# Patient Record
Sex: Male | Born: 1935
Health system: Southern US, Community
[De-identification: ages and names within clinical notes are randomized; demographics above are authoritative.]

## PROBLEM LIST (undated history)

## (undated) DIAGNOSIS — K219 Gastro-esophageal reflux disease without esophagitis: Secondary | ICD-10-CM

## (undated) DIAGNOSIS — T884XXA Failed or difficult intubation, initial encounter: Secondary | ICD-10-CM

## (undated) DIAGNOSIS — E119 Type 2 diabetes mellitus without complications: Secondary | ICD-10-CM

## (undated) DIAGNOSIS — I10 Essential (primary) hypertension: Secondary | ICD-10-CM

## (undated) DIAGNOSIS — E7800 Pure hypercholesterolemia, unspecified: Secondary | ICD-10-CM

## (undated) DIAGNOSIS — I4891 Unspecified atrial fibrillation: Secondary | ICD-10-CM

## (undated) DIAGNOSIS — E559 Vitamin D deficiency, unspecified: Secondary | ICD-10-CM

## (undated) DIAGNOSIS — C679 Malignant neoplasm of bladder, unspecified: Secondary | ICD-10-CM

## (undated) DIAGNOSIS — G912 (Idiopathic) normal pressure hydrocephalus: Secondary | ICD-10-CM

## (undated) DIAGNOSIS — R197 Diarrhea, unspecified: Secondary | ICD-10-CM

## (undated) DIAGNOSIS — N529 Male erectile dysfunction, unspecified: Secondary | ICD-10-CM

## (undated) DIAGNOSIS — N400 Enlarged prostate without lower urinary tract symptoms: Secondary | ICD-10-CM

## (undated) DIAGNOSIS — L219 Seborrheic dermatitis, unspecified: Secondary | ICD-10-CM

## (undated) DIAGNOSIS — F419 Anxiety disorder, unspecified: Secondary | ICD-10-CM

## (undated) HISTORY — PX: INGUINAL HERNIA REPAIR: SUR1180

## (undated) HISTORY — DX: Benign prostatic hyperplasia without lower urinary tract symptoms: N40.0

## (undated) HISTORY — DX: Vitamin D deficiency, unspecified: E55.9

## (undated) HISTORY — PX: BRAIN SURGERY: SHX531

## (undated) HISTORY — DX: Male erectile dysfunction, unspecified: N52.9

## (undated) HISTORY — DX: Malignant neoplasm of bladder, unspecified: C67.9

## (undated) HISTORY — DX: Unspecified atrial fibrillation: I48.91

## (undated) HISTORY — DX: Failed or difficult intubation, initial encounter: T88.4XXA

## (undated) HISTORY — DX: Type 2 diabetes mellitus without complications: E11.9

## (undated) HISTORY — DX: Gastro-esophageal reflux disease without esophagitis: K21.9

## (undated) HISTORY — PX: CATARACT EXTRACTION: SUR2

## (undated) HISTORY — DX: Anxiety disorder, unspecified: F41.9

## (undated) HISTORY — DX: Pure hypercholesterolemia, unspecified: E78.00

## (undated) HISTORY — DX: Diarrhea, unspecified: R19.7

## (undated) HISTORY — DX: Essential (primary) hypertension: I10

## (undated) HISTORY — DX: Seborrheic dermatitis, unspecified: L21.9

## (undated) HISTORY — DX: (Idiopathic) normal pressure hydrocephalus: G91.2

## (undated) HISTORY — PX: BLADDER SURGERY: SHX569

---

## 2000-07-22 HISTORY — PX: VENTRICULOPERITONEAL SHUNT: SHX204

## 2001-03-27 ENCOUNTER — Encounter: Admission: RE | Admit: 2001-03-27 | Discharge: 2001-03-27 | Payer: Self-pay | Admitting: Family Medicine

## 2001-03-27 ENCOUNTER — Encounter: Payer: Self-pay | Admitting: Family Medicine

## 2001-03-29 ENCOUNTER — Encounter: Admission: RE | Admit: 2001-03-29 | Discharge: 2001-03-29 | Payer: Self-pay | Admitting: Internal Medicine

## 2001-03-29 ENCOUNTER — Encounter: Payer: Self-pay | Admitting: Internal Medicine

## 2001-04-14 ENCOUNTER — Encounter: Payer: Self-pay | Admitting: Neurosurgery

## 2001-04-14 ENCOUNTER — Encounter: Admission: RE | Admit: 2001-04-14 | Discharge: 2001-04-14 | Payer: Self-pay | Admitting: Neurosurgery

## 2001-06-12 ENCOUNTER — Ambulatory Visit (HOSPITAL_COMMUNITY): Admission: RE | Admit: 2001-06-12 | Discharge: 2001-06-12 | Payer: Self-pay | Admitting: Neurology

## 2001-10-09 ENCOUNTER — Encounter: Payer: Self-pay | Admitting: General Surgery

## 2001-10-13 ENCOUNTER — Observation Stay (HOSPITAL_COMMUNITY): Admission: RE | Admit: 2001-10-13 | Discharge: 2001-10-14 | Payer: Self-pay | Admitting: General Surgery

## 2002-01-26 ENCOUNTER — Encounter: Admission: RE | Admit: 2002-01-26 | Discharge: 2002-01-26 | Payer: Self-pay | Admitting: Neurosurgery

## 2002-01-26 ENCOUNTER — Encounter: Payer: Self-pay | Admitting: Neurosurgery

## 2004-12-12 ENCOUNTER — Inpatient Hospital Stay (HOSPITAL_COMMUNITY): Admission: AD | Admit: 2004-12-12 | Discharge: 2004-12-13 | Payer: Self-pay | Admitting: Internal Medicine

## 2007-04-10 ENCOUNTER — Encounter: Payer: Self-pay | Admitting: Gastroenterology

## 2008-02-23 ENCOUNTER — Encounter: Payer: Self-pay | Admitting: Gastroenterology

## 2008-03-07 ENCOUNTER — Encounter: Payer: Self-pay | Admitting: Gastroenterology

## 2008-03-09 ENCOUNTER — Encounter: Payer: Self-pay | Admitting: Gastroenterology

## 2008-03-09 ENCOUNTER — Encounter: Admission: RE | Admit: 2008-03-09 | Discharge: 2008-03-09 | Payer: Self-pay | Admitting: Internal Medicine

## 2008-03-24 ENCOUNTER — Encounter: Payer: Self-pay | Admitting: Gastroenterology

## 2008-03-25 ENCOUNTER — Encounter: Payer: Self-pay | Admitting: Gastroenterology

## 2008-03-31 ENCOUNTER — Encounter: Payer: Self-pay | Admitting: Gastroenterology

## 2008-08-18 ENCOUNTER — Telehealth: Payer: Self-pay | Admitting: Gastroenterology

## 2010-04-03 ENCOUNTER — Ambulatory Visit: Payer: Self-pay | Admitting: Urology

## 2010-04-11 ENCOUNTER — Ambulatory Visit: Payer: Self-pay | Admitting: Urology

## 2010-04-12 LAB — PATHOLOGY REPORT

## 2010-12-07 NOTE — Procedures (Signed)
. [facility]  Patient:    Craig Whitehead, Craig Whitehead Visit Number: [id number] MRN: [id number]          Service Type: OUT Location: [facility] Attending Physician:  Erich Montane Dictated by:   Genene Churn. Love, M.D. Admit Date:  06/12/2001                             Procedure Report  DATE OF BIRTH:  1927-08-08  CLINICAL INFORMATION:  This patient is being evaluated for the possibility of communicating hydrocephalus.  A stand-walk-sit-stand-walk test x 5 with 14 feet apart chairs was performed prior to the procedure with a time of 2 minutes and 6 seconds.  TECHNICAL DESCRIPTION:  The patient was prepped and draped in left lateral decubitus position.  The L4-L5 interspace was entered without difficulty.  The opening pressure was 170 mmH2O with 34 cc of clear, colorless CSF removed and sent for studies, including VDRL, cell count, differential, protein, glucose, ______ and beta-amyloid 42 protein.  The patient tolerated the procedure well and a post-LP stand-walk-sitting test will be performed. Dictated by:   Genene Churn. Love, M.D. Attending Physician:  Erich Montane DD:  06/12/01 TD:  06/13/01 Job: [id number]

## 2010-12-07 NOTE — H&P (Signed)
NAME:  Craig Whitehead, Craig Whitehead NO.:  [id number]   MEDICAL RECORD NO.:  [id number]          PATIENT TYPE:  INP   LOCATION:  6735                         FACILITY:  [facility]   PHYSICIAN:  Candyce Churn, M.D.DATE OF BIRTH:  1927-08-12   DATE OF ADMISSION:  12/12/2004  DATE OF DISCHARGE:                                HISTORY & PHYSICAL   CHIEF COMPLAINT:  Weakness.   HISTORY OF PRESENT ILLNESS:  Craig Whitehead is a 77 year old white male  patient of Dr. Johnella Moloney with a one-day history of fatigue and  presyncope.  Patient has had decreasing p.o. intake over the last few days.  He has had some nausea during this time, but denies any abdominal pain.  No  shortness of breath or fever.  He has a history of normal pressure  hydrocephalus with shunt placement in 2001.   He has been recently placed on citalopram and blood pressure medication  which he has taken regularly.   In the office he was found to have a low blood pressure of 80/60.  He is  being admitted for the same.   REVIEW OF SYSTEMS:  No chills.  No fever.  CHEST:  No cough.  Denies any  chest pain.  No shortness of breath.  GI:  Again, no pain.  He had diarrhea  last week, but none in the last few days.  GU:  No infection symptomatology.  NEUROLOGIC:  Denies any headache, numbness, or tingling.  Generalized  weakness, but no focal weakness.   PAST MEDICAL HISTORY:  1.  Claustrophobia with chronic anxiety disorder.  2.  Hypertension.  3.  BPH.  4.  Allergic rhinitis.  5.  Normal pressure hydrocephalus with shunt placement.   MEDICATIONS:  1.  Citalopram 10 mg one a day.  2.  Avodart 0.5 mg one a day.  3.  Flomax 0.4 mg at h.s. daily.  4.  Diovan/HCT 80/12.5 one a day.   ALLERGIES:  None.   SOCIAL HISTORY:  Smoked half a pack per day for 50 years and stopped in  1997.  Drinks one or two alcoholic drinks a day.  Was previously a  [place].  Is divorced.   FAMILY HISTORY:  Father died of lung  cancer at 62.  Mother had rheumatoid  arthritis and died from complications of gold therapy at age 46.   OBJECTIVE DATA:  VITAL SIGNS:  Pulse 58, respirations 20, blood pressure  80/60, O2 saturations 93% on room air.  GENERAL:  He is a very pleasant gentleman.  Affect is somewhat flat.  He  answers appropriately.  SKIN:  Warm and dry.  GENERAL:  No acute distress.  HEENT:  Eyes:  PERRLA.  No nystagmus.  Ear, nose, and throat unremarkable.  NECK:  No JVD.  No carotid bruits auscultated.  HEART:  Normal S1, S2 without murmur, S3, click, or rub.  LUNGS:  Clear to auscultation.  ABDOMEN:  Soft and protuberant.  Bowel sounds present all four quadrants.  No abdominal tenderness is noted.  EXTREMITIES:  No edema.  NEUROLOGIC:  Cranial nerves II-XII intact.  Gait  steady.  Speech  appropriate.  No focal deficits seen.   EKG shows sinus bradycardia, rate in the 50s, unchanged from prior.   IMPRESSION:  Hypertension question secondary to blood pressure medications,  citalopram, dehydration, other.   PLAN:  Admit to telemetry.  Start normal saline IV.  Check laboratories  including TSH and an a.m. cortisol level.  Dr. Kevan Ny to follow.       TCL/MEDQ  D:  12/12/2004  T:  12/12/2004  Job:  [id number]

## 2010-12-07 NOTE — Op Note (Signed)
[facility]  Patient:    Craig Whitehead, Craig Whitehead Visit Number: [id number] MRN: [id number]          Service Type: SUR Location: 3W 0376 01 Attending Physician:  Tempie Donning Dictated by:   Gita Kudo, M.D. Proc. Date: 10/13/01 Admit Date:  10/13/2001   CC:         Genene Churn. Love, M.D.  Pearla Dubonnet, M.D.   Operative Report  OPERATIVE PROCEDURE:  Left inguinal hernia repair, with Prolene mesh preperitoneal plug and onlay.  SURGEON:  Gita Kudo, M.D.  ANESTHESIA:  General.  PREOPERATIVE DIAGNOSIS:  Left inguinal hernia.  POSTOPERATIVE DIAGNOSIS:  Left inguinal hernia--large sliding indirect and also direct.  CLINICAL SUMMARY:  This retired 77 year old male has had an inguinal hernia for many years. It is becoming larger and more symptomatic and he comes in now for elective repair.  OPERATIVE FINDINGS:  The patient had a very large left inguinal hernia that extended down to the scrotum. It had a sliding component consisting of sigmoid colon.  DESCRIPTION OF PROCEDURE:  Under satisfactory general anesthesia, having received IV Ancef, the patients abdomen was prepped and draped in the standard fashion. Transverse left lower abdominal incision made and carried down to and through external ring and external oblique. Bleeders tied with 3-0 Vicryl or coagulated. The self-retaining retractors were inserted and set to give excellent exposure. The hernia and spermatic cord were identified and dissected. The cord structures were held away with a Penrose drain and the large hernia sac opened and contents reduced under direct vision by carefully incising the colon attachments to the hernia. The sac was then closed after twisting it with a 0 Prolene suture ligature and excess cutaway. Following this, the sac was withdrawn into the abdominal cavity through the internal ring. One third of the 3 x 6-inch piece of Prolene mesh was  tailored into a badminton shuttle cock shaped plug and inserted into the defect and controlled with 3-0 Prolene sutures anchoring it the fascial edges. Then the floor of the canal was imbricated with a running 0 Prolene suture approximating the thinned out conjoined tendon and transversalis fascia over to the iliopubic tract and completely covering over the previous mesh plug and when tied, the internal ring was snug and the end of the suture left long. The remainder of the mesh was then cut into an oval with a slit to go around the cord structures. It was anchored at the internal ring with the previous sutures and then tacked around the periphery, under slight tension, to the internal oblique above and the inguinal ligament below. The tails were sutured to each other and the fascia above and lateral to the cord. The wound was then lavaged with saline and infiltrated with Marcaine for postoperative analgesia. Hemostasis was good and then the wound was closed with running 2-0 Vicryl for external oblique, 2-0 Vicryl for deep fascia, 3-0 Vicryl for subcutaneous, and Steri-Strips for skin. Sterile absorbent dressing was applied and the patient went to the recovery room from the operating room in good condition. Dictated by:   Gita Kudo, M.D. Attending Physician:  Tempie Donning DD:  10/13/01 TD:  10/14/01 Job: [id number]

## 2010-12-07 NOTE — Discharge Summary (Signed)
NAME:  Craig Whitehead, Craig Whitehead NO.:  [id number]   MEDICAL RECORD NO.:  [id number]          PATIENT TYPE:  INP   LOCATION:  6735                         FACILITY:  [facility]   PHYSICIAN:  Candyce Churn, M.D.DATE OF BIRTH:  [birth year]   DATE OF ADMISSION:  12/12/2004  DATE OF DISCHARGE:  12/13/2004                                 DISCHARGE SUMMARY   DISCHARGE DIAGNOSIS:  Orthostatic hypotension, multifactorial, likely  secondary to a combination of citalopram and blood pressure medication.   DISCHARGE MEDICATIONS:  Avodart 0.5 mg daily.   HOSPITAL COURSE:  [name] is a very pleasant 77 year old white  male who presented to [facility] on Dec 12, 2004 with a 1  day history of fatigue and presyncope. He had been having decreased p.o.  intake several days prior to admission. He has had some nausea as well. He  has a history of normal pressure hydrocephalus with shunt placement in 2001.  He has not been having any increased problems with urinary incontinence and  just feels very weak when he stands up and was amazed that he could actually  get into his car and drive to the office on the day of admission.   At [facility], he was noted to have a blood pressure of  80/60 standing and remained orthostatic on admission to [facility]. He was  treated with generous hydration with normal saline intravenously and within  24 hours was no longer orthostatic. He felt well at the time of discharge  and was able to be discharged home ambulatory and not orthostatic.   DISCHARGE PHYSICAL EXAMINATION:  VITAL SIGNS:  Revealed a blood pressure of  102 lying systolically, 102 to 110 sitting, and 102 standing. His systolic  blood pressure had dropped into the high 70s on admission.  GENERAL:  The patient experienced no diarrhea, fever, or chills while  hospitalized.   DISCHARGE LABORATORY DATA:  Revealed a white count of 9200, hemoglobin  14.8,  platelet count 280,000. Sodium 132, potassium 3.9, chloride 102, bicarb 23,  glucose was mildly elevated at 180, BUN 27, creatinine 1.5. LFTs were  normal. Hemoglobin A1c was 6.4%. Thyroid stimulating hormone was 1.177 -  normal, and a.m. cortisol was 12.5 - normal on Dec 13, 2004.   DISPOSITION:  The patient was discharged home eating well and drinking well  simply on Avodart. Citalopram 10 mg, Flomax 0.4 mg at night, and Diovan HCT  80/12.5 were all held.   FOLLOWUP:  The patient was to be followed up in my office in 1 week.       RNG/MEDQ  D:  12/15/2004  T:  12/15/2004  Job:  [id number]

## 2011-01-24 ENCOUNTER — Ambulatory Visit: Payer: Self-pay | Admitting: Urology

## 2011-02-06 ENCOUNTER — Ambulatory Visit: Payer: Self-pay | Admitting: Urology

## 2011-02-07 LAB — PATHOLOGY REPORT

## 2011-09-17 DIAGNOSIS — Z8551 Personal history of malignant neoplasm of bladder: Secondary | ICD-10-CM | POA: Diagnosis not present

## 2011-10-29 DIAGNOSIS — E119 Type 2 diabetes mellitus without complications: Secondary | ICD-10-CM | POA: Diagnosis not present

## 2011-12-19 DIAGNOSIS — R350 Frequency of micturition: Secondary | ICD-10-CM | POA: Diagnosis not present

## 2011-12-19 DIAGNOSIS — N400 Enlarged prostate without lower urinary tract symptoms: Secondary | ICD-10-CM | POA: Diagnosis not present

## 2011-12-19 DIAGNOSIS — E119 Type 2 diabetes mellitus without complications: Secondary | ICD-10-CM | POA: Diagnosis not present

## 2012-01-28 DIAGNOSIS — Z8551 Personal history of malignant neoplasm of bladder: Secondary | ICD-10-CM | POA: Diagnosis not present

## 2012-03-18 DIAGNOSIS — H26499 Other secondary cataract, unspecified eye: Secondary | ICD-10-CM | POA: Diagnosis not present

## 2012-03-18 DIAGNOSIS — H01009 Unspecified blepharitis unspecified eye, unspecified eyelid: Secondary | ICD-10-CM | POA: Diagnosis not present

## 2012-04-21 DIAGNOSIS — Z79899 Other long term (current) drug therapy: Secondary | ICD-10-CM | POA: Diagnosis not present

## 2012-04-21 DIAGNOSIS — N400 Enlarged prostate without lower urinary tract symptoms: Secondary | ICD-10-CM | POA: Diagnosis not present

## 2012-04-21 DIAGNOSIS — E119 Type 2 diabetes mellitus without complications: Secondary | ICD-10-CM | POA: Diagnosis not present

## 2012-04-21 DIAGNOSIS — L820 Inflamed seborrheic keratosis: Secondary | ICD-10-CM | POA: Diagnosis not present

## 2012-04-21 DIAGNOSIS — E782 Mixed hyperlipidemia: Secondary | ICD-10-CM | POA: Diagnosis not present

## 2012-04-21 DIAGNOSIS — R350 Frequency of micturition: Secondary | ICD-10-CM | POA: Diagnosis not present

## 2012-04-21 DIAGNOSIS — Z1331 Encounter for screening for depression: Secondary | ICD-10-CM | POA: Diagnosis not present

## 2012-04-21 DIAGNOSIS — Z0000 Encounter for general adult medical examination without abnormal findings: Secondary | ICD-10-CM | POA: Diagnosis not present

## 2012-05-26 DIAGNOSIS — N400 Enlarged prostate without lower urinary tract symptoms: Secondary | ICD-10-CM | POA: Diagnosis not present

## 2012-05-26 DIAGNOSIS — Z0000 Encounter for general adult medical examination without abnormal findings: Secondary | ICD-10-CM | POA: Diagnosis not present

## 2012-05-26 DIAGNOSIS — R350 Frequency of micturition: Secondary | ICD-10-CM | POA: Diagnosis not present

## 2012-05-26 DIAGNOSIS — E119 Type 2 diabetes mellitus without complications: Secondary | ICD-10-CM | POA: Diagnosis not present

## 2012-05-26 DIAGNOSIS — E782 Mixed hyperlipidemia: Secondary | ICD-10-CM | POA: Diagnosis not present

## 2012-08-21 DIAGNOSIS — Z8551 Personal history of malignant neoplasm of bladder: Secondary | ICD-10-CM | POA: Diagnosis not present

## 2012-08-21 DIAGNOSIS — N401 Enlarged prostate with lower urinary tract symptoms: Secondary | ICD-10-CM | POA: Diagnosis not present

## 2012-08-27 DIAGNOSIS — E119 Type 2 diabetes mellitus without complications: Secondary | ICD-10-CM | POA: Diagnosis not present

## 2012-08-27 DIAGNOSIS — E782 Mixed hyperlipidemia: Secondary | ICD-10-CM | POA: Diagnosis not present

## 2012-08-27 DIAGNOSIS — B351 Tinea unguium: Secondary | ICD-10-CM | POA: Diagnosis not present

## 2012-08-27 DIAGNOSIS — N400 Enlarged prostate without lower urinary tract symptoms: Secondary | ICD-10-CM | POA: Diagnosis not present

## 2012-08-27 DIAGNOSIS — R350 Frequency of micturition: Secondary | ICD-10-CM | POA: Diagnosis not present

## 2012-08-27 DIAGNOSIS — Z0000 Encounter for general adult medical examination without abnormal findings: Secondary | ICD-10-CM | POA: Diagnosis not present

## 2012-12-24 DIAGNOSIS — B351 Tinea unguium: Secondary | ICD-10-CM | POA: Diagnosis not present

## 2012-12-24 DIAGNOSIS — E119 Type 2 diabetes mellitus without complications: Secondary | ICD-10-CM | POA: Diagnosis not present

## 2012-12-24 DIAGNOSIS — E782 Mixed hyperlipidemia: Secondary | ICD-10-CM | POA: Diagnosis not present

## 2012-12-24 DIAGNOSIS — R350 Frequency of micturition: Secondary | ICD-10-CM | POA: Diagnosis not present

## 2012-12-24 DIAGNOSIS — N400 Enlarged prostate without lower urinary tract symptoms: Secondary | ICD-10-CM | POA: Diagnosis not present

## 2012-12-24 DIAGNOSIS — Z79899 Other long term (current) drug therapy: Secondary | ICD-10-CM | POA: Diagnosis not present

## 2013-03-09 DIAGNOSIS — E782 Mixed hyperlipidemia: Secondary | ICD-10-CM | POA: Diagnosis not present

## 2013-03-09 DIAGNOSIS — L820 Inflamed seborrheic keratosis: Secondary | ICD-10-CM | POA: Diagnosis not present

## 2013-03-09 DIAGNOSIS — R350 Frequency of micturition: Secondary | ICD-10-CM | POA: Diagnosis not present

## 2013-03-09 DIAGNOSIS — N400 Enlarged prostate without lower urinary tract symptoms: Secondary | ICD-10-CM | POA: Diagnosis not present

## 2013-03-09 DIAGNOSIS — E119 Type 2 diabetes mellitus without complications: Secondary | ICD-10-CM | POA: Diagnosis not present

## 2013-03-09 DIAGNOSIS — B351 Tinea unguium: Secondary | ICD-10-CM | POA: Diagnosis not present

## 2013-04-01 DIAGNOSIS — Z8551 Personal history of malignant neoplasm of bladder: Secondary | ICD-10-CM | POA: Diagnosis not present

## 2013-04-01 DIAGNOSIS — N401 Enlarged prostate with lower urinary tract symptoms: Secondary | ICD-10-CM | POA: Diagnosis not present

## 2013-04-01 DIAGNOSIS — N210 Calculus in bladder: Secondary | ICD-10-CM | POA: Diagnosis not present

## 2013-04-07 ENCOUNTER — Ambulatory Visit: Payer: Self-pay | Admitting: Urology

## 2013-04-07 DIAGNOSIS — N400 Enlarged prostate without lower urinary tract symptoms: Secondary | ICD-10-CM | POA: Diagnosis not present

## 2013-04-07 DIAGNOSIS — E119 Type 2 diabetes mellitus without complications: Secondary | ICD-10-CM | POA: Diagnosis not present

## 2013-04-07 DIAGNOSIS — Z01810 Encounter for preprocedural cardiovascular examination: Secondary | ICD-10-CM | POA: Diagnosis not present

## 2013-04-07 DIAGNOSIS — I10 Essential (primary) hypertension: Secondary | ICD-10-CM | POA: Diagnosis not present

## 2013-04-07 DIAGNOSIS — Z01812 Encounter for preprocedural laboratory examination: Secondary | ICD-10-CM | POA: Diagnosis not present

## 2013-04-07 LAB — BASIC METABOLIC PANEL
BUN: 17 mg/dL (ref 7–18)
Calcium, Total: 8.8 mg/dL (ref 8.5–10.1)
Chloride: 112 mmol/L — ABNORMAL HIGH (ref 98–107)
Co2: 23 mmol/L (ref 21–32)
Creatinine: 0.96 mg/dL (ref 0.60–1.30)
Glucose: 146 mg/dL — ABNORMAL HIGH (ref 65–99)
Osmolality: 287 (ref 275–301)
Sodium: 142 mmol/L (ref 136–145)

## 2013-04-07 LAB — HEMOGLOBIN: HGB: 13.1 g/dL (ref 13.0–18.0)

## 2013-04-14 ENCOUNTER — Ambulatory Visit: Payer: Self-pay | Admitting: Urology

## 2013-04-14 DIAGNOSIS — Z888 Allergy status to other drugs, medicaments and biological substances status: Secondary | ICD-10-CM | POA: Diagnosis not present

## 2013-04-14 DIAGNOSIS — Z8551 Personal history of malignant neoplasm of bladder: Secondary | ICD-10-CM | POA: Diagnosis not present

## 2013-04-14 DIAGNOSIS — K219 Gastro-esophageal reflux disease without esophagitis: Secondary | ICD-10-CM | POA: Diagnosis not present

## 2013-04-14 DIAGNOSIS — E119 Type 2 diabetes mellitus without complications: Secondary | ICD-10-CM | POA: Diagnosis not present

## 2013-04-14 DIAGNOSIS — N210 Calculus in bladder: Secondary | ICD-10-CM | POA: Diagnosis not present

## 2013-04-14 DIAGNOSIS — N401 Enlarged prostate with lower urinary tract symptoms: Secondary | ICD-10-CM | POA: Diagnosis not present

## 2013-04-14 DIAGNOSIS — Z91018 Allergy to other foods: Secondary | ICD-10-CM | POA: Diagnosis not present

## 2013-04-14 DIAGNOSIS — Z91041 Radiographic dye allergy status: Secondary | ICD-10-CM | POA: Diagnosis not present

## 2013-04-14 DIAGNOSIS — Z87891 Personal history of nicotine dependence: Secondary | ICD-10-CM | POA: Diagnosis not present

## 2013-04-14 DIAGNOSIS — Z79899 Other long term (current) drug therapy: Secondary | ICD-10-CM | POA: Diagnosis not present

## 2013-04-14 DIAGNOSIS — I440 Atrioventricular block, first degree: Secondary | ICD-10-CM | POA: Diagnosis not present

## 2013-04-14 DIAGNOSIS — N400 Enlarged prostate without lower urinary tract symptoms: Secondary | ICD-10-CM | POA: Diagnosis not present

## 2013-05-17 DIAGNOSIS — N401 Enlarged prostate with lower urinary tract symptoms: Secondary | ICD-10-CM | POA: Diagnosis not present

## 2013-06-08 DIAGNOSIS — N400 Enlarged prostate without lower urinary tract symptoms: Secondary | ICD-10-CM | POA: Diagnosis not present

## 2013-06-08 DIAGNOSIS — E782 Mixed hyperlipidemia: Secondary | ICD-10-CM | POA: Diagnosis not present

## 2013-06-08 DIAGNOSIS — L820 Inflamed seborrheic keratosis: Secondary | ICD-10-CM | POA: Diagnosis not present

## 2013-06-08 DIAGNOSIS — E1149 Type 2 diabetes mellitus with other diabetic neurological complication: Secondary | ICD-10-CM | POA: Diagnosis not present

## 2013-06-08 DIAGNOSIS — R350 Frequency of micturition: Secondary | ICD-10-CM | POA: Diagnosis not present

## 2013-10-28 DIAGNOSIS — Z8551 Personal history of malignant neoplasm of bladder: Secondary | ICD-10-CM | POA: Diagnosis not present

## 2013-12-30 DIAGNOSIS — N400 Enlarged prostate without lower urinary tract symptoms: Secondary | ICD-10-CM | POA: Diagnosis not present

## 2013-12-30 DIAGNOSIS — I10 Essential (primary) hypertension: Secondary | ICD-10-CM | POA: Diagnosis not present

## 2013-12-30 DIAGNOSIS — E119 Type 2 diabetes mellitus without complications: Secondary | ICD-10-CM | POA: Diagnosis not present

## 2013-12-30 DIAGNOSIS — R350 Frequency of micturition: Secondary | ICD-10-CM | POA: Diagnosis not present

## 2013-12-30 DIAGNOSIS — E559 Vitamin D deficiency, unspecified: Secondary | ICD-10-CM | POA: Diagnosis not present

## 2013-12-30 DIAGNOSIS — R3915 Urgency of urination: Secondary | ICD-10-CM | POA: Diagnosis not present

## 2013-12-30 DIAGNOSIS — B351 Tinea unguium: Secondary | ICD-10-CM | POA: Diagnosis not present

## 2014-01-06 DIAGNOSIS — R351 Nocturia: Secondary | ICD-10-CM | POA: Diagnosis not present

## 2014-01-06 DIAGNOSIS — R350 Frequency of micturition: Secondary | ICD-10-CM | POA: Diagnosis not present

## 2014-01-06 DIAGNOSIS — C679 Malignant neoplasm of bladder, unspecified: Secondary | ICD-10-CM | POA: Diagnosis not present

## 2014-01-18 DIAGNOSIS — C679 Malignant neoplasm of bladder, unspecified: Secondary | ICD-10-CM | POA: Diagnosis not present

## 2014-01-18 DIAGNOSIS — N400 Enlarged prostate without lower urinary tract symptoms: Secondary | ICD-10-CM | POA: Diagnosis not present

## 2014-04-19 DIAGNOSIS — E782 Mixed hyperlipidemia: Secondary | ICD-10-CM | POA: Diagnosis not present

## 2014-04-19 DIAGNOSIS — IMO0001 Reserved for inherently not codable concepts without codable children: Secondary | ICD-10-CM | POA: Diagnosis not present

## 2014-04-19 DIAGNOSIS — R32 Unspecified urinary incontinence: Secondary | ICD-10-CM | POA: Diagnosis not present

## 2014-04-19 DIAGNOSIS — I10 Essential (primary) hypertension: Secondary | ICD-10-CM | POA: Diagnosis not present

## 2014-04-19 DIAGNOSIS — Z0000 Encounter for general adult medical examination without abnormal findings: Secondary | ICD-10-CM | POA: Diagnosis not present

## 2014-04-19 DIAGNOSIS — E559 Vitamin D deficiency, unspecified: Secondary | ICD-10-CM | POA: Diagnosis not present

## 2014-04-19 DIAGNOSIS — G911 Obstructive hydrocephalus: Secondary | ICD-10-CM | POA: Diagnosis not present

## 2014-04-19 DIAGNOSIS — Z125 Encounter for screening for malignant neoplasm of prostate: Secondary | ICD-10-CM | POA: Diagnosis not present

## 2014-04-19 DIAGNOSIS — R279 Unspecified lack of coordination: Secondary | ICD-10-CM | POA: Diagnosis not present

## 2014-04-19 DIAGNOSIS — Z1331 Encounter for screening for depression: Secondary | ICD-10-CM | POA: Diagnosis not present

## 2014-04-28 DIAGNOSIS — Z961 Presence of intraocular lens: Secondary | ICD-10-CM | POA: Diagnosis not present

## 2014-04-28 DIAGNOSIS — E119 Type 2 diabetes mellitus without complications: Secondary | ICD-10-CM | POA: Diagnosis not present

## 2014-04-29 DIAGNOSIS — Z23 Encounter for immunization: Secondary | ICD-10-CM | POA: Diagnosis not present

## 2014-04-29 DIAGNOSIS — R197 Diarrhea, unspecified: Secondary | ICD-10-CM | POA: Diagnosis not present

## 2014-04-29 DIAGNOSIS — L820 Inflamed seborrheic keratosis: Secondary | ICD-10-CM | POA: Diagnosis not present

## 2014-04-29 DIAGNOSIS — E1165 Type 2 diabetes mellitus with hyperglycemia: Secondary | ICD-10-CM | POA: Diagnosis not present

## 2014-05-27 DIAGNOSIS — N400 Enlarged prostate without lower urinary tract symptoms: Secondary | ICD-10-CM | POA: Diagnosis not present

## 2014-05-27 DIAGNOSIS — C678 Malignant neoplasm of overlapping sites of bladder: Secondary | ICD-10-CM | POA: Diagnosis not present

## 2014-05-27 DIAGNOSIS — R3915 Urgency of urination: Secondary | ICD-10-CM | POA: Diagnosis not present

## 2014-05-27 DIAGNOSIS — N318 Other neuromuscular dysfunction of bladder: Secondary | ICD-10-CM | POA: Diagnosis not present

## 2014-06-01 DIAGNOSIS — G913 Post-traumatic hydrocephalus, unspecified: Secondary | ICD-10-CM | POA: Diagnosis not present

## 2014-06-01 DIAGNOSIS — R270 Ataxia, unspecified: Secondary | ICD-10-CM | POA: Diagnosis not present

## 2014-06-01 DIAGNOSIS — R3981 Functional urinary incontinence: Secondary | ICD-10-CM | POA: Diagnosis not present

## 2014-06-15 DIAGNOSIS — G913 Post-traumatic hydrocephalus, unspecified: Secondary | ICD-10-CM | POA: Diagnosis not present

## 2014-06-21 ENCOUNTER — Other Ambulatory Visit: Payer: Self-pay | Admitting: Neurosurgery

## 2014-06-21 DIAGNOSIS — N390 Urinary tract infection, site not specified: Secondary | ICD-10-CM | POA: Diagnosis not present

## 2014-06-21 DIAGNOSIS — R197 Diarrhea, unspecified: Secondary | ICD-10-CM | POA: Diagnosis not present

## 2014-06-21 DIAGNOSIS — R3915 Urgency of urination: Secondary | ICD-10-CM | POA: Diagnosis not present

## 2014-06-21 DIAGNOSIS — E1165 Type 2 diabetes mellitus with hyperglycemia: Secondary | ICD-10-CM | POA: Diagnosis not present

## 2014-06-21 DIAGNOSIS — R350 Frequency of micturition: Secondary | ICD-10-CM | POA: Diagnosis not present

## 2014-06-21 DIAGNOSIS — G913 Post-traumatic hydrocephalus, unspecified: Secondary | ICD-10-CM

## 2014-06-21 DIAGNOSIS — E559 Vitamin D deficiency, unspecified: Secondary | ICD-10-CM | POA: Diagnosis not present

## 2014-06-28 DIAGNOSIS — R350 Frequency of micturition: Secondary | ICD-10-CM | POA: Diagnosis not present

## 2014-06-28 DIAGNOSIS — N3281 Overactive bladder: Secondary | ICD-10-CM | POA: Diagnosis not present

## 2014-06-28 DIAGNOSIS — N400 Enlarged prostate without lower urinary tract symptoms: Secondary | ICD-10-CM | POA: Diagnosis not present

## 2014-07-01 ENCOUNTER — Ambulatory Visit
Admission: RE | Admit: 2014-07-01 | Discharge: 2014-07-01 | Disposition: A | Discharge status: Home / Self Care | Payer: Medicare Other | Source: Ambulatory Visit | Attending: Neurosurgery | Admitting: Neurosurgery

## 2014-07-01 DIAGNOSIS — Z982 Presence of cerebrospinal fluid drainage device: Secondary | ICD-10-CM | POA: Diagnosis not present

## 2014-07-01 DIAGNOSIS — C679 Malignant neoplasm of bladder, unspecified: Secondary | ICD-10-CM | POA: Diagnosis not present

## 2014-07-01 DIAGNOSIS — G913 Post-traumatic hydrocephalus, unspecified: Secondary | ICD-10-CM | POA: Diagnosis not present

## 2014-07-01 MED ORDER — GADOBENATE DIMEGLUMINE 529 MG/ML IV SOLN
15.0000 mL | Freq: Once | INTRAVENOUS | Status: AC | PRN
  Administered 2014-07-01: 15 mL via INTRAVENOUS

## 2014-07-05 DIAGNOSIS — R270 Ataxia, unspecified: Secondary | ICD-10-CM | POA: Diagnosis not present

## 2014-07-05 DIAGNOSIS — E1165 Type 2 diabetes mellitus with hyperglycemia: Secondary | ICD-10-CM | POA: Diagnosis not present

## 2014-07-05 DIAGNOSIS — R350 Frequency of micturition: Secondary | ICD-10-CM | POA: Diagnosis not present

## 2014-07-05 DIAGNOSIS — R197 Diarrhea, unspecified: Secondary | ICD-10-CM | POA: Diagnosis not present

## 2014-07-05 DIAGNOSIS — E559 Vitamin D deficiency, unspecified: Secondary | ICD-10-CM | POA: Diagnosis not present

## 2014-07-05 DIAGNOSIS — R3915 Urgency of urination: Secondary | ICD-10-CM | POA: Diagnosis not present

## 2014-07-06 ENCOUNTER — Encounter: Payer: Self-pay | Admitting: Neurology

## 2014-07-06 ENCOUNTER — Ambulatory Visit (INDEPENDENT_AMBULATORY_CARE_PROVIDER_SITE_OTHER): Payer: Medicare Other | Admitting: Neurology

## 2014-07-06 VITALS — BP 128/76 | HR 60 | Ht 67.0 in | Wt 155.0 lb

## 2014-07-06 DIAGNOSIS — G20 Parkinson's disease: Secondary | ICD-10-CM

## 2014-07-06 DIAGNOSIS — Z982 Presence of cerebrospinal fluid drainage device: Secondary | ICD-10-CM | POA: Diagnosis not present

## 2014-07-06 MED ORDER — CARBIDOPA-LEVODOPA 25-100 MG PO TABS: 1.0000 | ORAL_TABLET | Freq: Three times a day (TID) | ORAL | Status: DC

## 2014-07-06 NOTE — Progress Notes (Signed)
Craig Whitehead was seen today in the movement disorders clinic for neurologic consultation at the request of Dr. Vertell Limber.  His PCP is GATES,ROBERT NEVILL, MD.  The consultation is for the evaluation of ataxia.  The records that were made available to me were reviewed.  The patient has previously seen Dr. Erling Cruz as well as, but unfortunately none of those records are available any longer.   Pt has a hx of VP shunt. Pt states that it was placed in 2002 but Dr. Donald Pore records indicate it was 27.   Pt reports that he fell 3-4 years prior to the placement of the VP shunt.  He hit the back of his head on a table.  He really didn't think that much of it.  Not long thereafter he started shuffling but he didn't see the neurosurgeon (Dr. Carloyn Manner) until 3-4 years later.  Dr. Carloyn Manner sent pt to Dr. Erling Cruz for a 2nd opinion and a LP was done and it was felt that pt was better after the LP.  He was sent for a shunt and felt that he had a post-traumatic hydrocephalous (although pt reports today that the dx was NPH).   Pt states that post shunt, he got a "little" better (maybe 50/50 per patient) but thinks with time that he may be getting worse.  If he is carrying something he will fall.    An MRI of the brain was done with and without contrast on 07/01/2014.  I reviewed this.  There was ventriculomegaly that was out of proportion to atrophy and mild small vessel disease.  Specific Symptoms:  Tremor: No. Voice: no change Sleep: trouble getting to sleep and trouble staying asleep  Vivid Dreams:  Yes.    Acting out dreams:  No. Wet Pillows: No. Postural symptoms:  No.  Falls?  Yes.   Bradykinesia symptoms: shuffling gait, slow movements, difficulty getting out of a chair and difficulty regaining balance Loss of smell:  No. Loss of taste:  No. Urinary Incontinence:  Yes.   (multiple bladder surgeries due to bladder CA and BPH; incontinence did start prior to shunt) Difficulty Swallowing:  No. Handwriting, micrographia:  No. Trouble with ADL's:  No.  Trouble buttoning clothing: Yes.   (cuff buttons) Depression:  No. Memory changes:  No. Hallucinations:  No.  visual distortions: No. N/V:  No. Lightheaded:  No.  Syncope: No. Diplopia:  No. Dyskinesia:  No.    PREVIOUS MEDICATIONS: none to date  ALLERGIES:  No Known Allergies  CURRENT MEDICATIONS:  Outpatient Encounter Prescriptions as of 07/06/2014  Medication Sig  . cholecalciferol (VITAMIN D) 1000 UNITS tablet Take 4,000 Units by mouth daily.  . cholestyramine (QUESTRAN) 4 GM/DOSE powder Take 1 g by mouth daily.   . mirabegron ER (MYRBETRIQ) 50 MG TB24 tablet Take 50 mg by mouth daily.  . solifenacin (VESICARE) 10 MG tablet Take 10 mg by mouth daily.    PAST MEDICAL HISTORY:   Past Medical History  Diagnosis Date  . Bladder cancer   . BPH (benign prostatic hypertrophy)   . NPH (normal pressure hydrocephalus)   . Diarrhea   . Vitamin D deficiency     PAST SURGICAL HISTORY:   Past Surgical History  Procedure Laterality Date  . Ventriculoperitoneal shunt  2002  . Inguinal hernia repair Bilateral   . Bladder surgery      x 4 for bladder cancer  . Cataract extraction Bilateral     SOCIAL HISTORY:   History  Social History  . Marital Status: Divorced    Spouse Name: N/A    Number of Children: N/A  . Years of Education: N/A   Occupational History  . Not on file.   Social History Main Topics  . Smoking status: Former Smoker    Quit date: 07/06/1994  . Smokeless tobacco: Not on file  . Alcohol Use: 0.0 oz/week    0 Not specified per week     Comment: 2 shots daily  . Drug Use: No  . Sexual Activity: Not on file   Other Topics Concern  . Not on file   Social History Narrative  . No narrative on file    FAMILY HISTORY:   Family Status  Relation Status Death Age  . Mother Deceased     arthritis (died in hospital after treatment)  . Father Deceased     cancer  . Brother Alive     DM  . Brother Alive      healthy  . Son Alive     healthy  . Daughter Alive     healthy  . Daughter Alive     healthy    ROS:  A complete 10 system review of systems was obtained and was unremarkable apart from what is mentioned above.  PHYSICAL EXAMINATION:    VITALS:   Filed Vitals:   07/06/14 0936  BP: 128/76  Pulse: 60  Height: 5\' 7"  (1.702 m)  Weight: 155 lb (70.308 kg)    GEN:  The patient appears stated age and is in NAD. HEENT:  Normocephalic, atraumatic.  The mucous membranes are moist. The superficial temporal arteries are without ropiness or tenderness. CV:  RRR Lungs:  CTAB Neck/HEME:  There are no carotid bruits bilaterally.  Neurological examination:  Orientation: The patient is alert and oriented x3. Fund of knowledge is appropriate.  Recent and remote memory are intact.  Attention and concentration are normal.    Able to name objects and repeat phrases. Cranial nerves: There is good facial symmetry. Pupils are equal round and reactive to light bilaterally. Fundoscopic exam reveals clear margins bilaterally. Extraocular muscles are intact. The visual fields are full to confrontational testing. The speech is fluent and clear. Soft palate rises symmetrically and there is no tongue deviation. Hearing is intact to conversational tone. Sensation: Sensation is intact to light and pinprick throughout (facial, trunk, extremities). Vibration is decreased at the bilateral big toe. There is no extinction with double simultaneous stimulation. There is no sensory dermatomal level identified. Motor: Strength is 5/5 in the bilateral upper and lower extremities.   Shoulder shrug is equal and symmetric.  There is no pronator drift. Deep tendon reflexes: Deep tendon reflexes are 2/4 at the bilateral biceps, triceps, brachioradialis, patella and  trace at the bilateral achilles. Plantar responses are downgoing bilaterally.  Movement examination: Tone: There is normal tone in the bilateral upper extremities.   The tone in the lower extremities is normal Abnormal movements: slight tremor can be felt with distraction in the LUE.  There is chin tremor. Coordination:  There is decremation with RAM's, seen with hand opening/closing Gait and Station: The patient has mild difficulty arising out of a deep-seated chair without the use of the hands. The patient's stride length is decreased.  There is decreased arm swing.    ASSESSMENT/PLAN:  1.  Parkinsonism  -Not convinced on idiopathic PD.  IF he would have had this since 1999, I think it would have been very obvious by now.  However, he is bradykinetic, has mild tremor and has postural instability (no significant rigidity).  Still could be from NPH but has non-programmable shunt.  Only option at this point is to try levodopa and see if it helps.  He and I discussed r/b/se and he would like to try.  If no help, we could either d/c or increase dose a little.  He is willing to try.  -f/u 8-10 weeks, sooner should new neurologic issues arise  -may benefit from PD PT program but will wait to see if levodopa any benefit.

## 2014-07-06 NOTE — Patient Instructions (Signed)
1. Start Carbidopa Levodopa as follows: 1/2 tab three times a day before meals x 1 wk, then 1/2 in am & noon & 1 in evening for a week, then 1/2 in am &1 at noon &one in evening for a week, then 1 tablet three times a day before meals.    

## 2014-08-04 DIAGNOSIS — N400 Enlarged prostate without lower urinary tract symptoms: Secondary | ICD-10-CM | POA: Diagnosis not present

## 2014-08-04 DIAGNOSIS — N3281 Overactive bladder: Secondary | ICD-10-CM | POA: Diagnosis not present

## 2014-09-06 ENCOUNTER — Ambulatory Visit (INDEPENDENT_AMBULATORY_CARE_PROVIDER_SITE_OTHER): Payer: Medicare Other | Admitting: Neurology

## 2014-09-06 ENCOUNTER — Encounter: Payer: Self-pay | Admitting: Neurology

## 2014-09-06 VITALS — BP 102/58 | HR 60 | Ht 67.0 in | Wt 157.0 lb

## 2014-09-06 DIAGNOSIS — G20 Parkinson's disease: Secondary | ICD-10-CM

## 2014-09-06 DIAGNOSIS — Z982 Presence of cerebrospinal fluid drainage device: Secondary | ICD-10-CM | POA: Diagnosis not present

## 2014-09-06 NOTE — Progress Notes (Signed)
Note faxed to Dr Inda Merlin at [phone] with confirmation received.

## 2014-09-06 NOTE — Progress Notes (Signed)
Craig Whitehead was seen today in the movement disorders clinic for neurologic consultation at the request of Dr. Vertell Limber.  His PCP is GATES,ROBERT NEVILL, MD.  The consultation is for the evaluation of ataxia.  The records that were made available to me were reviewed.  The patient has previously seen Dr. Erling Cruz as well as, but unfortunately none of those records are available any longer.   Pt has a hx of VP shunt. Pt states that it was placed in 2002 but Dr. Donald Pore records indicate it was 32.   Pt reports that he fell 3-4 years prior to the placement of the VP shunt.  He hit the back of his head on a table.  He really didn't think that much of it.  Not long thereafter he started shuffling but he didn't see the neurosurgeon (Dr. Carloyn Manner) until 3-4 years later.  Dr. Carloyn Manner sent pt to Dr. Erling Cruz for a 2nd opinion and a LP was done and it was felt that pt was better after the LP.  He was sent for a shunt and felt that he had a post-traumatic hydrocephalous (although pt reports today that the dx was NPH).   Pt states that post shunt, he got a "little" better (maybe 50/50 per patient) but thinks with time that he may be getting worse.  If he is carrying something he will fall.    An MRI of the brain was done with and without contrast on 07/01/2014.  I reviewed this.  There was ventriculomegaly that was out of proportion to atrophy and mild small vessel disease.  09/06/14 update:  Pt returns for follow up.  I did try him on levodopa since last visit and that didn't seem to make any difference in his sx's.  He was told by someone that he used to shake and he doesn't anymore.  He thinks that his balance is good.  He takes the carbidopa/levodopa 25/100 at 6:30, noon and 5 pm.   He has had no falls, no hallucinations, no lightheadedness.      PREVIOUS MEDICATIONS: none to date  ALLERGIES:  No Known Allergies  CURRENT MEDICATIONS:  Outpatient Encounter Prescriptions as of 09/06/2014  Medication Sig  .  carbidopa-levodopa (SINEMET IR) 25-100 MG per tablet Take 1 tablet by mouth 3 (three) times daily.  . cholecalciferol (VITAMIN D) 1000 UNITS tablet Take 4,000 Units by mouth daily.  . cholestyramine (QUESTRAN) 4 GM/DOSE powder Take 1 g by mouth daily.   . mirabegron ER (MYRBETRIQ) 50 MG TB24 tablet Take 50 mg by mouth daily.  . solifenacin (VESICARE) 10 MG tablet Take 10 mg by mouth daily.    PAST MEDICAL HISTORY:   Past Medical History  Diagnosis Date  . Bladder cancer   . BPH (benign prostatic hypertrophy)   . NPH (normal pressure hydrocephalus)   . Diarrhea   . Vitamin D deficiency     PAST SURGICAL HISTORY:   Past Surgical History  Procedure Laterality Date  . Ventriculoperitoneal shunt  2002  . Inguinal hernia repair Bilateral   . Bladder surgery      x 4 for bladder cancer  . Cataract extraction Bilateral     SOCIAL HISTORY:   History   Social History  . Marital Status: Divorced    Spouse Name: N/A  . Number of Children: N/A  . Years of Education: N/A   Occupational History  . Not on file.   Social History Main Topics  . Smoking status: Former  Smoker    Quit date: 07/06/1994  . Smokeless tobacco: Not on file  . Alcohol Use: 0.0 oz/week    0 Standard drinks or equivalent per week     Comment: 2 shots daily  . Drug Use: No  . Sexual Activity: Not on file   Other Topics Concern  . Not on file   Social History Narrative    FAMILY HISTORY:   Family Status  Relation Status Death Age  . Mother Deceased     arthritis (died in hospital after treatment)  . Father Deceased     cancer  . Brother Alive     DM  . Brother Alive     healthy  . Son Alive     healthy  . Daughter Alive     healthy  . Daughter Alive     healthy    ROS:  A complete 10 system review of systems was obtained and was unremarkable apart from what is mentioned above.  PHYSICAL EXAMINATION:    VITALS:   Filed Vitals:   09/06/14 1007  BP: 102/58  Pulse: 60  Height: 5\' 7"   (1.702 m)  Weight: 157 lb (71.215 kg)    GEN:  The patient appears stated age and is in NAD. HEENT:  Normocephalic, atraumatic.  The mucous membranes are moist. The superficial temporal arteries are without ropiness or tenderness. CV:  RRR Lungs:  CTAB Neck/HEME:  There are no carotid bruits bilaterally.  Neurological examination:  Orientation: The patient is alert and oriented x3. Fund of knowledge is appropriate.  Recent and remote memory are intact.  Attention and concentration are normal.    Able to name objects and repeat phrases. Cranial nerves: There is good facial symmetry. Pupils are equal round and reactive to light bilaterally. Fundoscopic exam reveals clear margins bilaterally. Extraocular muscles are intact. The visual fields are full to confrontational testing. The speech is fluent and clear. Soft palate rises symmetrically and there is no tongue deviation. Hearing is intact to conversational tone. Sensation: Sensation is intact to light and pinprick throughout (facial, trunk, extremities). Vibration is decreased at the bilateral big toe. There is no extinction with double simultaneous stimulation. There is no sensory dermatomal level identified. Motor: Strength is 5/5 in the bilateral upper and lower extremities.   Shoulder shrug is equal and symmetric.  There is no pronator drift. Deep tendon reflexes: Deep tendon reflexes are 2/4 at the bilateral biceps, triceps, brachioradialis, patella and  trace at the bilateral achilles. Plantar responses are downgoing bilaterally.  Movement examination: Tone: There is normal tone in the bilateral upper extremities.  The tone in the lower extremities is normal Abnormal movements: cannot feel arm tremor today.  There is chin tremor. Coordination:  There is no significant decremation with RAM's on either side Gait and Station: The patient has mild difficulty arising out of a deep-seated chair without the use of the hands. The patient's stride  length is decreased.  There is decreased arm swing.    ASSESSMENT/PLAN:  1.  Parkinsonism  -Not convinced on idiopathic PD.  IF he would have had this since 1999, I think it would have been very obvious by now.  However, he is bradykinetic, has mild tremor and has postural instability (no significant rigidity).  Still could be from NPH but has non-programmable shunt.  Could also be vascular in nature.  Nonetheless, he didn't think levodopa that beneficial and will d/c it.  Doesn't want to enroll in PT program and will  let us know if he changes his mind.  -f/u prn

## 2014-09-15 DIAGNOSIS — N3281 Overactive bladder: Secondary | ICD-10-CM | POA: Diagnosis not present

## 2014-10-11 DIAGNOSIS — R350 Frequency of micturition: Secondary | ICD-10-CM | POA: Diagnosis not present

## 2014-10-11 DIAGNOSIS — N3281 Overactive bladder: Secondary | ICD-10-CM | POA: Diagnosis not present

## 2014-10-24 DIAGNOSIS — E1165 Type 2 diabetes mellitus with hyperglycemia: Secondary | ICD-10-CM | POA: Diagnosis not present

## 2014-10-24 DIAGNOSIS — R350 Frequency of micturition: Secondary | ICD-10-CM | POA: Diagnosis not present

## 2014-10-24 DIAGNOSIS — R3915 Urgency of urination: Secondary | ICD-10-CM | POA: Diagnosis not present

## 2014-10-24 DIAGNOSIS — E559 Vitamin D deficiency, unspecified: Secondary | ICD-10-CM | POA: Diagnosis not present

## 2014-10-24 DIAGNOSIS — R270 Ataxia, unspecified: Secondary | ICD-10-CM | POA: Diagnosis not present

## 2014-10-24 DIAGNOSIS — R159 Full incontinence of feces: Secondary | ICD-10-CM | POA: Diagnosis not present

## 2014-11-11 NOTE — Op Note (Signed)
PATIENT NAME:  Craig Whitehead, ANNE MR#:  [id number] DATE OF BIRTH:  [birth year]  DATE OF PROCEDURE:  04/14/2013  PREOPERATIVE DIAGNOSES:  1.  Benign prostatic hypertrophy with lower urinary tract symptoms.  2.  Bladder calculus.   POSTOPERATIVE DIAGNOSES: 1.  Benign prostatic hypertrophy with lower urinary tract symptoms.  2.  Bladder calculus.    PROCEDURES:  1.  Photoselective vaporization of the prostate.  2.  Cystolitholapaxy.   SURGEON: Scott C. Bernardo Heater, M.D.   ASSISTANT: None.   ANESTHESIA: General.   INDICATIONS: This is an 81 year old male with moderate to severe lower urinary tract symptoms. He has been on Avodart, tamsulosin and anticholinergic medication with minimal improvement in his symptoms. He complains of frequency, urgency, hesitancy and decreased force and caliber of his urinary stream. Recent cystoscopy shows moderate median lobe and lateral lobe enlargement. A bladder calculus was present. After discussion of treatment options, he has elected photoselective vaporization and cystolitholapaxy.   DESCRIPTION OF PROCEDURE: The patient was taken to the cystoscopy suite and placed on the table in the supine position where a general anesthetic was administered via an LMA. He was then placed in the low lithotomy position and his external genitalia were prepped and draped in the usual fashion. A timeout was performed per protocol with all in agreement. A 22-French continuous flow laser cystoscope was lubricated and passed under direct vision. The urethra was normal in caliber without stricture. Prostate is remarkable for a moderate median lobe and moderate lateral lobe enlargement. Approximately 15 mm calculus was noted in the bladder. There were no bladder mucosal lesions present. A KTP laser fiber was placed through the cystoscope and beginning at a power of 80 watts, median lobe was vaporized down to capsular fibers working from the bladder neck toward the verumontanum. Power was  increased to 100 watts and the lateral lobes were vaporized from bladder neck to verumontanum. Some additional tissue on the right lateral lobe was vaporized at 120 watts. Hemostasis was obtained with laser coagulation. At the completion of the procedure, hemostasis was adequate and the channel was opened with the scope positioned at the verumontanum.  Laser time was 34 minutes, 58 seconds. Total energy delivered was 195,674 joules. A 975 micron holmium laser fiber was then placed through the cystoscope. The bladder stone was easily fragmented at a power of 10 watts and  all fragments were removed with irrigation. All areas were inspected for hemostasis, which was adequate. The cystoscope was removed. A 20- French Foley catheter was placed with return of light pink-tinged effluent upon irrigation. A B and O suppository was placed per rectum. The patient was taken to PACU in stable condition. There were no complications. EBL was minimal.    ____________________________ [name]. Bernardo Heater, MD scs:aw D: 04/14/2013 09:50:18 ET T: 04/14/2013 10:08:58 ET JOB#: [id number]  cc: Nicki Reaper C. Bernardo Heater, MD, <Dictator> Abbie Sons MD ELECTRONICALLY SIGNED 04/22/2013 12:09

## 2014-11-14 DIAGNOSIS — N3281 Overactive bladder: Secondary | ICD-10-CM | POA: Diagnosis not present

## 2014-11-14 DIAGNOSIS — R350 Frequency of micturition: Secondary | ICD-10-CM | POA: Diagnosis not present

## 2014-11-14 DIAGNOSIS — Z0000 Encounter for general adult medical examination without abnormal findings: Secondary | ICD-10-CM | POA: Diagnosis not present

## 2014-12-13 DIAGNOSIS — C678 Malignant neoplasm of overlapping sites of bladder: Secondary | ICD-10-CM | POA: Diagnosis not present

## 2015-01-27 DIAGNOSIS — R350 Frequency of micturition: Secondary | ICD-10-CM | POA: Diagnosis not present

## 2015-01-27 DIAGNOSIS — R3915 Urgency of urination: Secondary | ICD-10-CM | POA: Diagnosis not present

## 2015-01-27 DIAGNOSIS — E1165 Type 2 diabetes mellitus with hyperglycemia: Secondary | ICD-10-CM | POA: Diagnosis not present

## 2015-01-27 DIAGNOSIS — E559 Vitamin D deficiency, unspecified: Secondary | ICD-10-CM | POA: Diagnosis not present

## 2015-01-27 DIAGNOSIS — R159 Full incontinence of feces: Secondary | ICD-10-CM | POA: Diagnosis not present

## 2015-01-27 DIAGNOSIS — R270 Ataxia, unspecified: Secondary | ICD-10-CM | POA: Diagnosis not present

## 2015-02-07 DIAGNOSIS — N3281 Overactive bladder: Secondary | ICD-10-CM | POA: Diagnosis not present

## 2015-03-14 DIAGNOSIS — R350 Frequency of micturition: Secondary | ICD-10-CM | POA: Diagnosis not present

## 2015-03-14 DIAGNOSIS — N3281 Overactive bladder: Secondary | ICD-10-CM | POA: Diagnosis not present

## 2015-04-18 DIAGNOSIS — N3281 Overactive bladder: Secondary | ICD-10-CM | POA: Diagnosis not present

## 2015-04-18 DIAGNOSIS — R350 Frequency of micturition: Secondary | ICD-10-CM | POA: Diagnosis not present

## 2015-05-05 DIAGNOSIS — R270 Ataxia, unspecified: Secondary | ICD-10-CM | POA: Diagnosis not present

## 2015-05-05 DIAGNOSIS — E559 Vitamin D deficiency, unspecified: Secondary | ICD-10-CM | POA: Diagnosis not present

## 2015-05-05 DIAGNOSIS — E1165 Type 2 diabetes mellitus with hyperglycemia: Secondary | ICD-10-CM | POA: Diagnosis not present

## 2015-05-05 DIAGNOSIS — R3915 Urgency of urination: Secondary | ICD-10-CM | POA: Diagnosis not present

## 2015-05-05 DIAGNOSIS — R350 Frequency of micturition: Secondary | ICD-10-CM | POA: Diagnosis not present

## 2015-05-05 DIAGNOSIS — R159 Full incontinence of feces: Secondary | ICD-10-CM | POA: Diagnosis not present

## 2015-05-05 DIAGNOSIS — I10 Essential (primary) hypertension: Secondary | ICD-10-CM | POA: Diagnosis not present

## 2015-07-06 DIAGNOSIS — R350 Frequency of micturition: Secondary | ICD-10-CM | POA: Diagnosis not present

## 2015-07-06 DIAGNOSIS — N3281 Overactive bladder: Secondary | ICD-10-CM | POA: Diagnosis not present

## 2015-07-13 DIAGNOSIS — R3915 Urgency of urination: Secondary | ICD-10-CM | POA: Diagnosis not present

## 2015-07-13 DIAGNOSIS — R350 Frequency of micturition: Secondary | ICD-10-CM | POA: Diagnosis not present

## 2015-07-20 DIAGNOSIS — R3915 Urgency of urination: Secondary | ICD-10-CM | POA: Diagnosis not present

## 2015-07-27 DIAGNOSIS — R3915 Urgency of urination: Secondary | ICD-10-CM | POA: Diagnosis not present

## 2015-07-27 DIAGNOSIS — R350 Frequency of micturition: Secondary | ICD-10-CM | POA: Diagnosis not present

## 2015-08-03 DIAGNOSIS — R3915 Urgency of urination: Secondary | ICD-10-CM | POA: Diagnosis not present

## 2015-08-03 DIAGNOSIS — R350 Frequency of micturition: Secondary | ICD-10-CM | POA: Diagnosis not present

## 2015-08-10 DIAGNOSIS — R350 Frequency of micturition: Secondary | ICD-10-CM | POA: Diagnosis not present

## 2015-08-17 ENCOUNTER — Other Ambulatory Visit (HOSPITAL_COMMUNITY): Payer: Self-pay | Admitting: Internal Medicine

## 2015-08-17 DIAGNOSIS — R350 Frequency of micturition: Secondary | ICD-10-CM | POA: Diagnosis not present

## 2015-08-17 DIAGNOSIS — Z86010 Personal history of colonic polyps: Secondary | ICD-10-CM | POA: Diagnosis not present

## 2015-08-17 DIAGNOSIS — Z7984 Long term (current) use of oral hypoglycemic drugs: Secondary | ICD-10-CM | POA: Diagnosis not present

## 2015-08-17 DIAGNOSIS — R3915 Urgency of urination: Secondary | ICD-10-CM | POA: Diagnosis not present

## 2015-08-17 DIAGNOSIS — E1340 Other specified diabetes mellitus with diabetic neuropathy, unspecified: Secondary | ICD-10-CM | POA: Diagnosis not present

## 2015-08-17 DIAGNOSIS — G914 Hydrocephalus in diseases classified elsewhere: Secondary | ICD-10-CM | POA: Diagnosis not present

## 2015-08-17 DIAGNOSIS — I10 Essential (primary) hypertension: Secondary | ICD-10-CM | POA: Diagnosis not present

## 2015-08-17 DIAGNOSIS — E559 Vitamin D deficiency, unspecified: Secondary | ICD-10-CM | POA: Diagnosis not present

## 2015-08-17 DIAGNOSIS — R1011 Right upper quadrant pain: Secondary | ICD-10-CM

## 2015-08-17 DIAGNOSIS — Z0001 Encounter for general adult medical examination with abnormal findings: Secondary | ICD-10-CM | POA: Diagnosis not present

## 2015-08-17 DIAGNOSIS — Z79899 Other long term (current) drug therapy: Secondary | ICD-10-CM | POA: Diagnosis not present

## 2015-08-17 DIAGNOSIS — Z1389 Encounter for screening for other disorder: Secondary | ICD-10-CM | POA: Diagnosis not present

## 2015-08-17 DIAGNOSIS — I499 Cardiac arrhythmia, unspecified: Secondary | ICD-10-CM | POA: Diagnosis not present

## 2015-08-17 DIAGNOSIS — E1165 Type 2 diabetes mellitus with hyperglycemia: Secondary | ICD-10-CM | POA: Diagnosis not present

## 2015-08-21 ENCOUNTER — Encounter (HOSPITAL_COMMUNITY)
Admission: RE | Admit: 2015-08-21 | Discharge: 2015-08-21 | Disposition: A | Discharge status: Home / Self Care | Payer: Medicare Other | Source: Ambulatory Visit | Attending: Internal Medicine | Admitting: Internal Medicine

## 2015-08-21 ENCOUNTER — Ambulatory Visit (HOSPITAL_COMMUNITY)
Admission: RE | Admit: 2015-08-21 | Discharge: 2015-08-21 | Disposition: A | Discharge status: Home / Self Care | Payer: Medicare Other | Source: Ambulatory Visit | Attending: Internal Medicine | Admitting: Internal Medicine

## 2015-08-21 DIAGNOSIS — R93421 Abnormal radiologic findings on diagnostic imaging of right kidney: Secondary | ICD-10-CM | POA: Diagnosis not present

## 2015-08-21 DIAGNOSIS — R1011 Right upper quadrant pain: Secondary | ICD-10-CM

## 2015-08-21 DIAGNOSIS — R109 Unspecified abdominal pain: Secondary | ICD-10-CM | POA: Insufficient documentation

## 2015-08-21 DIAGNOSIS — R93422 Abnormal radiologic findings on diagnostic imaging of left kidney: Secondary | ICD-10-CM | POA: Diagnosis not present

## 2015-08-21 MED ORDER — TECHNETIUM TC 99M MEBROFENIN IV KIT
5.0000 | PACK | Freq: Once | INTRAVENOUS | Status: AC | PRN
  Administered 2015-08-21: 5 via INTRAVENOUS

## 2015-08-24 DIAGNOSIS — E559 Vitamin D deficiency, unspecified: Secondary | ICD-10-CM | POA: Diagnosis not present

## 2015-08-24 DIAGNOSIS — R350 Frequency of micturition: Secondary | ICD-10-CM | POA: Diagnosis not present

## 2015-08-24 DIAGNOSIS — E1165 Type 2 diabetes mellitus with hyperglycemia: Secondary | ICD-10-CM | POA: Diagnosis not present

## 2015-08-24 DIAGNOSIS — R3915 Urgency of urination: Secondary | ICD-10-CM | POA: Diagnosis not present

## 2015-08-24 DIAGNOSIS — R159 Full incontinence of feces: Secondary | ICD-10-CM | POA: Diagnosis not present

## 2015-08-31 DIAGNOSIS — R3915 Urgency of urination: Secondary | ICD-10-CM | POA: Diagnosis not present

## 2015-09-07 DIAGNOSIS — R3915 Urgency of urination: Secondary | ICD-10-CM | POA: Diagnosis not present

## 2015-09-07 DIAGNOSIS — R350 Frequency of micturition: Secondary | ICD-10-CM | POA: Diagnosis not present

## 2015-09-14 DIAGNOSIS — R3915 Urgency of urination: Secondary | ICD-10-CM | POA: Diagnosis not present

## 2015-09-21 DIAGNOSIS — R350 Frequency of micturition: Secondary | ICD-10-CM | POA: Diagnosis not present

## 2015-09-21 DIAGNOSIS — R3915 Urgency of urination: Secondary | ICD-10-CM | POA: Diagnosis not present

## 2015-09-30 DIAGNOSIS — L0390 Cellulitis, unspecified: Secondary | ICD-10-CM | POA: Diagnosis not present

## 2015-09-30 DIAGNOSIS — W5501XA Bitten by cat, initial encounter: Secondary | ICD-10-CM | POA: Diagnosis not present

## 2015-09-30 DIAGNOSIS — Z23 Encounter for immunization: Secondary | ICD-10-CM | POA: Diagnosis not present

## 2015-09-30 DIAGNOSIS — S61459A Open bite of unspecified hand, initial encounter: Secondary | ICD-10-CM | POA: Diagnosis not present

## 2015-10-12 DIAGNOSIS — R350 Frequency of micturition: Secondary | ICD-10-CM | POA: Diagnosis not present

## 2015-10-12 DIAGNOSIS — R3915 Urgency of urination: Secondary | ICD-10-CM | POA: Diagnosis not present

## 2015-11-16 DIAGNOSIS — R350 Frequency of micturition: Secondary | ICD-10-CM | POA: Diagnosis not present

## 2015-12-07 DIAGNOSIS — R3915 Urgency of urination: Secondary | ICD-10-CM | POA: Diagnosis not present

## 2015-12-07 DIAGNOSIS — R350 Frequency of micturition: Secondary | ICD-10-CM | POA: Diagnosis not present

## 2015-12-28 DIAGNOSIS — R159 Full incontinence of feces: Secondary | ICD-10-CM | POA: Diagnosis not present

## 2015-12-28 DIAGNOSIS — I10 Essential (primary) hypertension: Secondary | ICD-10-CM | POA: Diagnosis not present

## 2015-12-28 DIAGNOSIS — E1165 Type 2 diabetes mellitus with hyperglycemia: Secondary | ICD-10-CM | POA: Diagnosis not present

## 2015-12-28 DIAGNOSIS — E559 Vitamin D deficiency, unspecified: Secondary | ICD-10-CM | POA: Diagnosis not present

## 2015-12-28 DIAGNOSIS — Z86010 Personal history of colonic polyps: Secondary | ICD-10-CM | POA: Diagnosis not present

## 2015-12-28 DIAGNOSIS — R350 Frequency of micturition: Secondary | ICD-10-CM | POA: Diagnosis not present

## 2015-12-28 DIAGNOSIS — G914 Hydrocephalus in diseases classified elsewhere: Secondary | ICD-10-CM | POA: Diagnosis not present

## 2015-12-28 DIAGNOSIS — R1011 Right upper quadrant pain: Secondary | ICD-10-CM | POA: Diagnosis not present

## 2015-12-28 DIAGNOSIS — Z79899 Other long term (current) drug therapy: Secondary | ICD-10-CM | POA: Diagnosis not present

## 2015-12-28 DIAGNOSIS — R270 Ataxia, unspecified: Secondary | ICD-10-CM | POA: Diagnosis not present

## 2015-12-28 DIAGNOSIS — E1340 Other specified diabetes mellitus with diabetic neuropathy, unspecified: Secondary | ICD-10-CM | POA: Diagnosis not present

## 2015-12-28 DIAGNOSIS — R3915 Urgency of urination: Secondary | ICD-10-CM | POA: Diagnosis not present

## 2016-01-04 DIAGNOSIS — N3941 Urge incontinence: Secondary | ICD-10-CM | POA: Diagnosis not present

## 2016-01-12 DIAGNOSIS — N400 Enlarged prostate without lower urinary tract symptoms: Secondary | ICD-10-CM | POA: Diagnosis not present

## 2016-01-12 DIAGNOSIS — N3281 Overactive bladder: Secondary | ICD-10-CM | POA: Diagnosis not present

## 2016-01-12 DIAGNOSIS — C678 Malignant neoplasm of overlapping sites of bladder: Secondary | ICD-10-CM | POA: Diagnosis not present

## 2016-01-31 DIAGNOSIS — M545 Low back pain: Secondary | ICD-10-CM | POA: Diagnosis not present

## 2016-01-31 DIAGNOSIS — M9903 Segmental and somatic dysfunction of lumbar region: Secondary | ICD-10-CM | POA: Diagnosis not present

## 2016-01-31 DIAGNOSIS — M62830 Muscle spasm of back: Secondary | ICD-10-CM | POA: Diagnosis not present

## 2016-01-31 DIAGNOSIS — M546 Pain in thoracic spine: Secondary | ICD-10-CM | POA: Diagnosis not present

## 2016-01-31 DIAGNOSIS — M9902 Segmental and somatic dysfunction of thoracic region: Secondary | ICD-10-CM | POA: Diagnosis not present

## 2016-01-31 DIAGNOSIS — M9904 Segmental and somatic dysfunction of sacral region: Secondary | ICD-10-CM | POA: Diagnosis not present

## 2016-02-01 DIAGNOSIS — M545 Low back pain: Secondary | ICD-10-CM | POA: Diagnosis not present

## 2016-02-01 DIAGNOSIS — R350 Frequency of micturition: Secondary | ICD-10-CM | POA: Diagnosis not present

## 2016-02-01 DIAGNOSIS — M546 Pain in thoracic spine: Secondary | ICD-10-CM | POA: Diagnosis not present

## 2016-02-01 DIAGNOSIS — M9904 Segmental and somatic dysfunction of sacral region: Secondary | ICD-10-CM | POA: Diagnosis not present

## 2016-02-01 DIAGNOSIS — N3941 Urge incontinence: Secondary | ICD-10-CM | POA: Diagnosis not present

## 2016-02-01 DIAGNOSIS — M9903 Segmental and somatic dysfunction of lumbar region: Secondary | ICD-10-CM | POA: Diagnosis not present

## 2016-02-01 DIAGNOSIS — M62830 Muscle spasm of back: Secondary | ICD-10-CM | POA: Diagnosis not present

## 2016-02-01 DIAGNOSIS — M9902 Segmental and somatic dysfunction of thoracic region: Secondary | ICD-10-CM | POA: Diagnosis not present

## 2016-02-05 DIAGNOSIS — M9903 Segmental and somatic dysfunction of lumbar region: Secondary | ICD-10-CM | POA: Diagnosis not present

## 2016-02-05 DIAGNOSIS — M9904 Segmental and somatic dysfunction of sacral region: Secondary | ICD-10-CM | POA: Diagnosis not present

## 2016-02-05 DIAGNOSIS — M545 Low back pain: Secondary | ICD-10-CM | POA: Diagnosis not present

## 2016-02-05 DIAGNOSIS — M9902 Segmental and somatic dysfunction of thoracic region: Secondary | ICD-10-CM | POA: Diagnosis not present

## 2016-02-05 DIAGNOSIS — M62830 Muscle spasm of back: Secondary | ICD-10-CM | POA: Diagnosis not present

## 2016-02-05 DIAGNOSIS — M546 Pain in thoracic spine: Secondary | ICD-10-CM | POA: Diagnosis not present

## 2016-02-07 DIAGNOSIS — M9903 Segmental and somatic dysfunction of lumbar region: Secondary | ICD-10-CM | POA: Diagnosis not present

## 2016-02-07 DIAGNOSIS — M9904 Segmental and somatic dysfunction of sacral region: Secondary | ICD-10-CM | POA: Diagnosis not present

## 2016-02-07 DIAGNOSIS — M62830 Muscle spasm of back: Secondary | ICD-10-CM | POA: Diagnosis not present

## 2016-02-07 DIAGNOSIS — M9902 Segmental and somatic dysfunction of thoracic region: Secondary | ICD-10-CM | POA: Diagnosis not present

## 2016-02-07 DIAGNOSIS — M546 Pain in thoracic spine: Secondary | ICD-10-CM | POA: Diagnosis not present

## 2016-02-07 DIAGNOSIS — M545 Low back pain: Secondary | ICD-10-CM | POA: Diagnosis not present

## 2016-02-08 DIAGNOSIS — M62830 Muscle spasm of back: Secondary | ICD-10-CM | POA: Diagnosis not present

## 2016-02-08 DIAGNOSIS — M545 Low back pain: Secondary | ICD-10-CM | POA: Diagnosis not present

## 2016-02-08 DIAGNOSIS — M546 Pain in thoracic spine: Secondary | ICD-10-CM | POA: Diagnosis not present

## 2016-02-08 DIAGNOSIS — M9902 Segmental and somatic dysfunction of thoracic region: Secondary | ICD-10-CM | POA: Diagnosis not present

## 2016-02-08 DIAGNOSIS — M9903 Segmental and somatic dysfunction of lumbar region: Secondary | ICD-10-CM | POA: Diagnosis not present

## 2016-02-08 DIAGNOSIS — M9904 Segmental and somatic dysfunction of sacral region: Secondary | ICD-10-CM | POA: Diagnosis not present

## 2016-02-12 DIAGNOSIS — M9902 Segmental and somatic dysfunction of thoracic region: Secondary | ICD-10-CM | POA: Diagnosis not present

## 2016-02-12 DIAGNOSIS — M546 Pain in thoracic spine: Secondary | ICD-10-CM | POA: Diagnosis not present

## 2016-02-12 DIAGNOSIS — M9904 Segmental and somatic dysfunction of sacral region: Secondary | ICD-10-CM | POA: Diagnosis not present

## 2016-02-12 DIAGNOSIS — M9903 Segmental and somatic dysfunction of lumbar region: Secondary | ICD-10-CM | POA: Diagnosis not present

## 2016-02-12 DIAGNOSIS — M62830 Muscle spasm of back: Secondary | ICD-10-CM | POA: Diagnosis not present

## 2016-02-12 DIAGNOSIS — M545 Low back pain: Secondary | ICD-10-CM | POA: Diagnosis not present

## 2016-02-14 DIAGNOSIS — M546 Pain in thoracic spine: Secondary | ICD-10-CM | POA: Diagnosis not present

## 2016-02-14 DIAGNOSIS — M9903 Segmental and somatic dysfunction of lumbar region: Secondary | ICD-10-CM | POA: Diagnosis not present

## 2016-02-14 DIAGNOSIS — M62830 Muscle spasm of back: Secondary | ICD-10-CM | POA: Diagnosis not present

## 2016-02-14 DIAGNOSIS — M9902 Segmental and somatic dysfunction of thoracic region: Secondary | ICD-10-CM | POA: Diagnosis not present

## 2016-02-14 DIAGNOSIS — M9904 Segmental and somatic dysfunction of sacral region: Secondary | ICD-10-CM | POA: Diagnosis not present

## 2016-02-14 DIAGNOSIS — M545 Low back pain: Secondary | ICD-10-CM | POA: Diagnosis not present

## 2016-02-15 DIAGNOSIS — M546 Pain in thoracic spine: Secondary | ICD-10-CM | POA: Diagnosis not present

## 2016-02-15 DIAGNOSIS — M9903 Segmental and somatic dysfunction of lumbar region: Secondary | ICD-10-CM | POA: Diagnosis not present

## 2016-02-15 DIAGNOSIS — M9904 Segmental and somatic dysfunction of sacral region: Secondary | ICD-10-CM | POA: Diagnosis not present

## 2016-02-15 DIAGNOSIS — M62830 Muscle spasm of back: Secondary | ICD-10-CM | POA: Diagnosis not present

## 2016-02-15 DIAGNOSIS — M9902 Segmental and somatic dysfunction of thoracic region: Secondary | ICD-10-CM | POA: Diagnosis not present

## 2016-02-15 DIAGNOSIS — M545 Low back pain: Secondary | ICD-10-CM | POA: Diagnosis not present

## 2016-02-19 DIAGNOSIS — M9902 Segmental and somatic dysfunction of thoracic region: Secondary | ICD-10-CM | POA: Diagnosis not present

## 2016-02-19 DIAGNOSIS — M546 Pain in thoracic spine: Secondary | ICD-10-CM | POA: Diagnosis not present

## 2016-02-19 DIAGNOSIS — M62830 Muscle spasm of back: Secondary | ICD-10-CM | POA: Diagnosis not present

## 2016-02-19 DIAGNOSIS — M9903 Segmental and somatic dysfunction of lumbar region: Secondary | ICD-10-CM | POA: Diagnosis not present

## 2016-02-19 DIAGNOSIS — M9904 Segmental and somatic dysfunction of sacral region: Secondary | ICD-10-CM | POA: Diagnosis not present

## 2016-02-19 DIAGNOSIS — M545 Low back pain: Secondary | ICD-10-CM | POA: Diagnosis not present

## 2016-02-22 DIAGNOSIS — M545 Low back pain: Secondary | ICD-10-CM | POA: Diagnosis not present

## 2016-02-22 DIAGNOSIS — M9904 Segmental and somatic dysfunction of sacral region: Secondary | ICD-10-CM | POA: Diagnosis not present

## 2016-02-22 DIAGNOSIS — N3941 Urge incontinence: Secondary | ICD-10-CM | POA: Diagnosis not present

## 2016-02-22 DIAGNOSIS — M62830 Muscle spasm of back: Secondary | ICD-10-CM | POA: Diagnosis not present

## 2016-02-22 DIAGNOSIS — M9902 Segmental and somatic dysfunction of thoracic region: Secondary | ICD-10-CM | POA: Diagnosis not present

## 2016-02-22 DIAGNOSIS — M546 Pain in thoracic spine: Secondary | ICD-10-CM | POA: Diagnosis not present

## 2016-02-22 DIAGNOSIS — M9903 Segmental and somatic dysfunction of lumbar region: Secondary | ICD-10-CM | POA: Diagnosis not present

## 2016-02-26 DIAGNOSIS — M9903 Segmental and somatic dysfunction of lumbar region: Secondary | ICD-10-CM | POA: Diagnosis not present

## 2016-02-26 DIAGNOSIS — M545 Low back pain: Secondary | ICD-10-CM | POA: Diagnosis not present

## 2016-02-26 DIAGNOSIS — M62830 Muscle spasm of back: Secondary | ICD-10-CM | POA: Diagnosis not present

## 2016-02-26 DIAGNOSIS — M9904 Segmental and somatic dysfunction of sacral region: Secondary | ICD-10-CM | POA: Diagnosis not present

## 2016-02-26 DIAGNOSIS — M546 Pain in thoracic spine: Secondary | ICD-10-CM | POA: Diagnosis not present

## 2016-02-26 DIAGNOSIS — M9902 Segmental and somatic dysfunction of thoracic region: Secondary | ICD-10-CM | POA: Diagnosis not present

## 2016-02-29 DIAGNOSIS — M546 Pain in thoracic spine: Secondary | ICD-10-CM | POA: Diagnosis not present

## 2016-02-29 DIAGNOSIS — M9902 Segmental and somatic dysfunction of thoracic region: Secondary | ICD-10-CM | POA: Diagnosis not present

## 2016-02-29 DIAGNOSIS — M9903 Segmental and somatic dysfunction of lumbar region: Secondary | ICD-10-CM | POA: Diagnosis not present

## 2016-02-29 DIAGNOSIS — M545 Low back pain: Secondary | ICD-10-CM | POA: Diagnosis not present

## 2016-02-29 DIAGNOSIS — M9904 Segmental and somatic dysfunction of sacral region: Secondary | ICD-10-CM | POA: Diagnosis not present

## 2016-02-29 DIAGNOSIS — M62830 Muscle spasm of back: Secondary | ICD-10-CM | POA: Diagnosis not present

## 2016-03-04 DIAGNOSIS — M62830 Muscle spasm of back: Secondary | ICD-10-CM | POA: Diagnosis not present

## 2016-03-04 DIAGNOSIS — M9904 Segmental and somatic dysfunction of sacral region: Secondary | ICD-10-CM | POA: Diagnosis not present

## 2016-03-04 DIAGNOSIS — M9903 Segmental and somatic dysfunction of lumbar region: Secondary | ICD-10-CM | POA: Diagnosis not present

## 2016-03-04 DIAGNOSIS — M546 Pain in thoracic spine: Secondary | ICD-10-CM | POA: Diagnosis not present

## 2016-03-04 DIAGNOSIS — M9902 Segmental and somatic dysfunction of thoracic region: Secondary | ICD-10-CM | POA: Diagnosis not present

## 2016-03-04 DIAGNOSIS — M545 Low back pain: Secondary | ICD-10-CM | POA: Diagnosis not present

## 2016-03-07 DIAGNOSIS — M9903 Segmental and somatic dysfunction of lumbar region: Secondary | ICD-10-CM | POA: Diagnosis not present

## 2016-03-07 DIAGNOSIS — M545 Low back pain: Secondary | ICD-10-CM | POA: Diagnosis not present

## 2016-03-07 DIAGNOSIS — M62830 Muscle spasm of back: Secondary | ICD-10-CM | POA: Diagnosis not present

## 2016-03-07 DIAGNOSIS — M9902 Segmental and somatic dysfunction of thoracic region: Secondary | ICD-10-CM | POA: Diagnosis not present

## 2016-03-07 DIAGNOSIS — M546 Pain in thoracic spine: Secondary | ICD-10-CM | POA: Diagnosis not present

## 2016-03-07 DIAGNOSIS — M9904 Segmental and somatic dysfunction of sacral region: Secondary | ICD-10-CM | POA: Diagnosis not present

## 2016-04-04 DIAGNOSIS — R350 Frequency of micturition: Secondary | ICD-10-CM | POA: Diagnosis not present

## 2016-04-25 DIAGNOSIS — N3941 Urge incontinence: Secondary | ICD-10-CM | POA: Diagnosis not present

## 2016-05-13 ENCOUNTER — Emergency Department (HOSPITAL_COMMUNITY)
Admission: EM | Admit: 2016-05-13 | Discharge: 2016-05-14 | Disposition: A | Discharge status: Other Acute Inpatient Hospital | Payer: Medicare Other | Attending: Emergency Medicine | Admitting: Emergency Medicine

## 2016-05-13 ENCOUNTER — Encounter (HOSPITAL_COMMUNITY): Payer: Self-pay | Admitting: Emergency Medicine

## 2016-05-13 DIAGNOSIS — R45851 Suicidal ideations: Secondary | ICD-10-CM

## 2016-05-13 DIAGNOSIS — I499 Cardiac arrhythmia, unspecified: Secondary | ICD-10-CM | POA: Diagnosis not present

## 2016-05-13 DIAGNOSIS — I10 Essential (primary) hypertension: Secondary | ICD-10-CM | POA: Diagnosis not present

## 2016-05-13 DIAGNOSIS — F329 Major depressive disorder, single episode, unspecified: Secondary | ICD-10-CM | POA: Diagnosis not present

## 2016-05-13 DIAGNOSIS — Z8551 Personal history of malignant neoplasm of bladder: Secondary | ICD-10-CM | POA: Insufficient documentation

## 2016-05-13 DIAGNOSIS — E559 Vitamin D deficiency, unspecified: Secondary | ICD-10-CM | POA: Diagnosis not present

## 2016-05-13 DIAGNOSIS — Z049 Encounter for examination and observation for unspecified reason: Secondary | ICD-10-CM

## 2016-05-13 DIAGNOSIS — Z87891 Personal history of nicotine dependence: Secondary | ICD-10-CM | POA: Insufficient documentation

## 2016-05-13 DIAGNOSIS — E1340 Other specified diabetes mellitus with diabetic neuropathy, unspecified: Secondary | ICD-10-CM | POA: Diagnosis not present

## 2016-05-13 DIAGNOSIS — R159 Full incontinence of feces: Secondary | ICD-10-CM | POA: Diagnosis not present

## 2016-05-13 DIAGNOSIS — F330 Major depressive disorder, recurrent, mild: Secondary | ICD-10-CM | POA: Diagnosis not present

## 2016-05-13 DIAGNOSIS — Z79899 Other long term (current) drug therapy: Secondary | ICD-10-CM | POA: Diagnosis not present

## 2016-05-13 DIAGNOSIS — R270 Ataxia, unspecified: Secondary | ICD-10-CM | POA: Diagnosis not present

## 2016-05-13 DIAGNOSIS — R3915 Urgency of urination: Secondary | ICD-10-CM | POA: Diagnosis not present

## 2016-05-13 DIAGNOSIS — E1165 Type 2 diabetes mellitus with hyperglycemia: Secondary | ICD-10-CM | POA: Diagnosis not present

## 2016-05-13 DIAGNOSIS — F332 Major depressive disorder, recurrent severe without psychotic features: Secondary | ICD-10-CM | POA: Diagnosis present

## 2016-05-13 DIAGNOSIS — G914 Hydrocephalus in diseases classified elsewhere: Secondary | ICD-10-CM | POA: Diagnosis not present

## 2016-05-13 LAB — RAPID URINE DRUG SCREEN, HOSP PERFORMED
Amphetamines: NOT DETECTED
Barbiturates: NOT DETECTED
Benzodiazepines: NOT DETECTED
COCAINE: NOT DETECTED
OPIATES: NOT DETECTED
TETRAHYDROCANNABINOL: NOT DETECTED

## 2016-05-13 LAB — CBC
HCT: 37.4 % — ABNORMAL LOW (ref 39.0–52.0)
Hemoglobin: 12.3 g/dL — ABNORMAL LOW (ref 13.0–17.0)
MCH: 33.3 pg (ref 26.0–34.0)
MCHC: 32.9 g/dL (ref 30.0–36.0)
MCV: 101.4 fL — ABNORMAL HIGH (ref 78.0–100.0)
PLATELETS: 223 10*3/uL (ref 150–400)
RBC: 3.69 MIL/uL — ABNORMAL LOW (ref 4.22–5.81)
RDW: 13.9 % (ref 11.5–15.5)
WBC: 6.5 10*3/uL (ref 4.0–10.5)

## 2016-05-13 LAB — COMPREHENSIVE METABOLIC PANEL
ALK PHOS: 71 U/L (ref 38–126)
ALT: 25 U/L (ref 17–63)
ANION GAP: 8 (ref 5–15)
AST: 24 U/L (ref 15–41)
Albumin: 4.1 g/dL (ref 3.5–5.0)
BUN: 21 mg/dL — ABNORMAL HIGH (ref 6–20)
CALCIUM: 8.8 mg/dL — AB (ref 8.9–10.3)
CO2: 22 mmol/L (ref 22–32)
CREATININE: 1.14 mg/dL (ref 0.61–1.24)
Chloride: 110 mmol/L (ref 101–111)
GFR, EST NON AFRICAN AMERICAN: 56 mL/min — AB (ref >60)
Glucose, Bld: 241 mg/dL — ABNORMAL HIGH (ref 65–99)
Potassium: 3.6 mmol/L (ref 3.5–5.1)
SODIUM: 140 mmol/L (ref 135–145)
TOTAL PROTEIN: 6.8 g/dL (ref 6.5–8.1)
Total Bilirubin: 0.7 mg/dL (ref 0.3–1.2)

## 2016-05-13 LAB — SALICYLATE LEVEL

## 2016-05-13 LAB — ACETAMINOPHEN LEVEL

## 2016-05-13 LAB — TSH: TSH: 1.43 u[IU]/mL (ref 0.350–4.500)

## 2016-05-13 LAB — ETHANOL

## 2016-05-13 NOTE — ED Notes (Signed)
Bed: [facility] Expected date:  Expected time:  Means of arrival:  Comments: 

## 2016-05-13 NOTE — ED Provider Notes (Signed)
[facility] DEPT Provider Note   CSN: [id number] Arrival date & time: 05/13/16  0911     History   Chief Complaint Chief Complaint  Patient presents with  . Suicidal    HPI Craig Whitehead is a 82 y.o. male.  The history is provided by the patient.  Mental Health Problem  Presenting symptoms: depression and suicidal thoughts   Presenting symptoms: no hallucinations   Degree of incapacity (severity):  Severe Onset quality:  Sudden Duration:  2 days Timing:  Constant Progression:  Worsening Chronicity:  New Context: stressful life event   Treatment compliance:  Untreated Relieved by:  Nothing Worsened by:  Nothing Ineffective treatments:  None tried Associated symptoms: anhedonia, feelings of worthlessness and irritability   Associated symptoms: no abdominal pain   Risk factors comment:  Homelessness, elderly, male, gun owner   Past Medical History:  Diagnosis Date  . Bladder cancer ([place])   . BPH (benign prostatic hypertrophy)   . Diarrhea   . NPH (normal pressure hydrocephalus)   . Vitamin D deficiency     There are no active problems to display for this patient.   Past Surgical History:  Procedure Laterality Date  . BLADDER SURGERY     x 4 for bladder cancer  . BRAIN SURGERY    . CATARACT EXTRACTION Bilateral   . INGUINAL HERNIA REPAIR Bilateral   . VENTRICULOPERITONEAL SHUNT  2002       Home Medications    Prior to Admission medications   Medication Sig Start Date End Date Taking? Authorizing Provider  cholecalciferol (VITAMIN D) 1000 UNITS tablet Take 4,000 Units by mouth daily.   Yes Historical Provider, MD  mirabegron ER (MYRBETRIQ) 50 MG TB24 tablet Take 50 mg by mouth daily.   Yes Historical Provider, MD  solifenacin (VESICARE) 10 MG tablet Take 10 mg by mouth daily.   Yes Historical Provider, MD  carbidopa-levodopa (SINEMET IR) 25-100 MG per tablet Take 1 tablet by mouth 3 (three) times daily. Patient not taking: Reported on  05/13/2016 07/06/14   Eustace Quail Tat, DO    Family History No family history on file.  Social History Social History  Substance Use Topics  . Smoking status: Former Smoker    Quit date: 07/06/1994  . Smokeless tobacco: Never Used  . Alcohol use 0.0 oz/week     Comment: 2 shots daily     Allergies   Review of patient's allergies indicates no known allergies.   Review of Systems Review of Systems  Constitutional: Positive for irritability.  Gastrointestinal: Negative for abdominal pain.  Psychiatric/Behavioral: Positive for suicidal ideas. Negative for hallucinations.  All other systems reviewed and are negative.    Physical Exam Updated Vital Signs BP 139/62 (BP Location: Right Arm)   Pulse 67   Temp 98.1 F (36.7 C) (Oral)   Resp 17   Ht 5\' 7"  (1.702 m)   Wt 153 lb (69.4 kg)   SpO2 97%   BMI 23.96 kg/m   Physical Exam  Constitutional: He is oriented to person, place, and time. He appears well-developed and well-nourished. No distress.  HENT:  Head: Normocephalic and atraumatic.  Nose: Nose normal.  Eyes: Conjunctivae are normal.  Neck: Neck supple. No tracheal deviation present.  Cardiovascular: Normal rate, regular rhythm and normal heart sounds.   Pulmonary/Chest: Effort normal and breath sounds normal. No respiratory distress.  Abdominal: Soft. He exhibits no distension.  Neurological: He is alert and oriented to person, place, and time.  Skin: Skin  is warm and dry.  Psychiatric: He has a normal mood and affect. His speech is normal and behavior is normal. He expresses suicidal ideation. He expresses suicidal plans.  Vitals reviewed.    ED Treatments / Results  Labs (all labs ordered are listed, but only abnormal results are displayed) Labs Reviewed  COMPREHENSIVE METABOLIC PANEL  ETHANOL  SALICYLATE LEVEL  ACETAMINOPHEN LEVEL  CBC  RAPID URINE DRUG SCREEN, HOSP PERFORMED  TSH    EKG  EKG Interpretation None       Radiology No results  found.  Procedures Procedures (including critical care time)  Medications Ordered in ED Medications - No data to display   Initial Impression / Assessment and Plan / ED Course  I have reviewed the triage vital signs and the nursing notes.  Pertinent labs & imaging results that were available during my care of the patient were reviewed by me and considered in my medical decision making (see chart for details).  Clinical Course   82 y.o. male presents with Suicidal thoughts occurring over the last 2 days with a plan to shoot himself in the head with a gun that he has. He was affected from the apartment where he lived 2 months ago after his landlady inspected his apartment and found diapers stacked up in the bathroom which the patient states he would intermittently take to the garbage but would save up. He also had an issue with a leaking toilet which cause water damage. Since the time of the eviction the patient has been unable to find housing partly due to lack of a prior housing reference. He states that he has become increasingly hopeless and depressed and now wishes to end his life by shooting himself in the head.  The patient is extremely high risk with advanced age, male sex, access to weaponry with a violent and effective suicide plan. No ingestions, changes in medications, or other acute medical complaints. TTS consulted for suspected admission.  MEDICALLY CLEAR FOR TRANSFER OR PSYCHIATRIC ADMISSION.   Final Clinical Impressions(s) / ED Diagnoses   Final diagnoses:  Suicidal thoughts  Depression, unspecified depression type    New Prescriptions New Prescriptions   No medications on file     Leo Grosser, MD 05/13/16 1010

## 2016-05-13 NOTE — ED Notes (Signed)
Patient resting in bed at this time, even rise and fall of chest. NAD noted at this time.

## 2016-05-13 NOTE — ED Notes (Signed)
Unable to collect labs patient is getting dress in to scrubs

## 2016-05-13 NOTE — ED Triage Notes (Addendum)
Patient went to Dr Ninetta Lights at [place] this morning and was referred here due to [facility] with plan on shooting himself with gun x 2 days.  Patient states that he does have access to gun.  Patient is now homeless with either sleeping in his car or in his chiropractic office.  Patient is having financial issues (bankrupt) and has hopelessness to point of wanting to end his life.

## 2016-05-13 NOTE — ED Notes (Signed)
TTS doing tele psych at this time.

## 2016-05-13 NOTE — BH Assessment (Signed)
Tele Assessment Note   Pt presents voluntarily to  Hospital after being referred there from today's am appointment with Dr Ninetta Lights at [place] this am. He had reported plan to shoot himself with his own gun. Pt is pleasant and cooperative. He reports he is no longer suicidal. When asked what changed from his speaking with Dr Ninetta Lights, pt says, "I don't want to spend eternity in hell." He reports his gun is in his car. He reports no social support system. Pt sts, "I was evicted because I had sorry upkeep (of apartment) and a leaky water tank I didn't report. That is a lie, I reported the leaky water tank twice." He says his landlady kicked him out two months ago. Pt sts he is bankrupt. He says he is sleeping in his car or in his chiropractic office. 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 He sts he needs to call his office to cancel today's appointments. He endorses loss of interest in usual pleasures, irritability and worthlessness. Pt also reports hopelessness. He reports good appetite but says he has lost approx. 10 lbs recently. Pt has no upcoming court dates. He has no hx of inpatient or outpatient [facility] treatment. He denies hx of substance abuse. He has three children (son & 2 daughters). Pt denies family hx of MI, SA or SI. Pt reports he has difficulty walking and instead "shuffles".  Pt sts he is originally from [place] and went to college in [place].   Craig Whitehead is an 82 y.o. male.   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 Bladder cancer ([place])   . BPH (benign prostatic hypertrophy)   . Diarrhea   . NPH (normal pressure hydrocephalus)   . Vitamin D deficiency     Past Surgical History:  Procedure Laterality Date  . BLADDER SURGERY     x 4 for bladder cancer  . BRAIN SURGERY    . CATARACT EXTRACTION Bilateral   . INGUINAL HERNIA REPAIR Bilateral   .  VENTRICULOPERITONEAL SHUNT  2002    Family History: No family history on file.  Social History:  reports that he quit smoking about 21 years ago. He has never used smokeless tobacco. He reports that he drinks alcohol. He reports that he does not use drugs.  Additional Social History:  Alcohol / Drug Use Pain Medications: pt denies abuse - see pta meds list Prescriptions: pt denies abuse - see pta meds list Over the Counter: pt denies abuse -see pta meds list History of alcohol / drug use?: Yes Substance #1 Name of Substance 1: etoh 1 - Amount (size/oz): one bourbon drink 1 - Frequency: daily 1 - Duration: currently  CIWA: CIWA-Ar BP: 139/62 Pulse Rate: 67 COWS:    PATIENT STRENGTHS: (choose at least two) Average or above average intelligence Capable of independent living Communication skills Work skills  Allergies: No Known Allergies  Home Medications:  (Not in a hospital admission)  OB/GYN Status:  No LMP for male patient.  General Assessment Data [facility] Assessment: [facility] TTS Assessment: In system Is this a Tele or Face-to-Face Assessment?: Tele Assessment Is this an Initial Assessment or a Re-assessment for this encounter?: Initial Assessment Marital status: Single Maiden name: n Is patient pregnant?: No Pregnancy Status: No Living Arrangements: Alone (in car or chiropractic offic e) Can pt return to current living arrangement?: Yes  Admission Status: Voluntary Is patient capable of signing voluntary admission?: Yes Referral Source: MD (his PCP) Insurance type: medicare     Crisis Care Plan Living Arrangements: Alone (in car or chiropractic offic e) Name of Psychiatrist: none Name of Therapist: none  Education Status Is patient currently in school?: No Name of school: college in [place] to self with the past 6 months Suicidal Ideation: No Has patient been a risk to self within the past 6 months prior to admission? : Yes Suicidal  Intent: No Has patient had any suicidal intent within the past 6 months prior to admission? : Yes Is patient at risk for suicide?: Yes Suicidal Plan?:  (pt now denies plan) Has patient had any suicidal plan within the past 6 months prior to admission? : Yes Access to Means: Yes Specify Access to Suicidal Means: pt told Dr Ninetta Lights today he would shoot himself in head with gun What has been your use of drugs/alcohol within the last 12 months?: one bourbon drink daily Previous Attempts/Gestures: No Other Self Harm Risks: none Triggers for Past Attempts:  (n/a) Intentional Self Injurious Behavior: None Family Suicide History: No Recent stressful life event(s): Financial Problems (homeless) Persecutory voices/beliefs?: No Depression: Yes Depression Symptoms: Feeling worthless/self pity, Loss of interest in usual pleasures, Feeling angry/irritable Substance abuse history and/or treatment for substance abuse?: No Suicide prevention information given to non-admitted patients: Not applicable  Risk to Others within the past 6 months Homicidal Ideation: No Does patient have any lifetime risk of violence toward others beyond the six months prior to admission? : No Thoughts of Harm to Others: No Current Homicidal Intent: No Current Homicidal Plan: No Access to Homicidal Means: No Identified Victim: none History of harm to others?: No Assessment of Violence: None Noted Violent Behavior Description: pt denies hx violence Does patient have access to weapons?: Yes (Comment) (has one gun in his car) Criminal Charges Pending?: No Does patient have a court date: No Is patient on probation?: No  Psychosis Hallucinations: None noted Delusions: None noted  Mental Status Report Appearance/Hygiene: Unremarkable Eye Contact: Good Motor Activity: Freedom of movement Speech: Logical/coherent Level of Consciousness: Alert Mood: Anhedonia, Irritable Affect:  (euthymic) Anxiety Level: None Thought  Processes: Relevant, Coherent Judgement: Unimpaired Orientation: Person, Place, Time, Situation Obsessive Compulsive Thoughts/Behaviors: None  Cognitive Functioning Concentration: Normal Memory: Recent Intact, Remote Intact IQ: Average Insight: Fair Impulse Control: Fair Appetite: Good Weight Loss: 10 Sleep: Decreased Total Hours of Sleep: 4 Vegetative Symptoms: None  ADLScreening [facility] Assessment Services) Patient's cognitive ability adequate to safely complete daily activities?: Yes Patient able to express need for assistance with ADLs?: Yes Independently performs ADLs?: Yes (appropriate for developmental age)  Prior Inpatient Therapy Prior Inpatient Therapy: No  Prior Outpatient Therapy Prior Outpatient Therapy: No Does patient have an ACCT team?: No Does patient have Intensive In-House Services?  : No Does patient have [facility] services? : No Does patient have P4CC services?: No  ADL Screening (condition at time of admission) Patient's cognitive ability adequate to safely complete daily activities?: Yes Is the patient deaf or have difficulty hearing?: No Does the patient have difficulty seeing, even when wearing glasses/contacts?: No Does the patient have difficulty concentrating, remembering, or making decisions?: No Patient able to express need for assistance with ADLs?: Yes Does the patient have difficulty dressing or bathing?: No Independently performs ADLs?: Yes (appropriate for developmental age) Does the patient have difficulty walking or climbing stairs?: No Weakness of Legs: None Weakness of Arms/Hands: None  Home Assistive Devices/Equipment Home Assistive Devices/Equipment:  (diapers)    Abuse/Neglect Assessment (Assessment to be complete while patient is alone) Physical Abuse: Denies Verbal Abuse: Denies Sexual Abuse: Denies Exploitation of patient/patient's resources: Denies Self-Neglect: Denies     Advance Directives (For Healthcare) Does  patient have an advance directive?: Yes Would patient like information on creating an advanced directive?: No - patient declined information Copy of advanced directive(s) in chart?: No - copy requested (pt sts he doesn't have copy with him)    Additional Information 1:1 In Past 12 Months?: No CIRT Risk: No Elopement Risk: No Does patient have medical clearance?: Yes     Disposition:  Disposition Initial Assessment Completed for this Encounter: Yes Disposition of Patient: Inpatient treatment program ([facility] dnp recommends geropsych inpatient placement)  Morghan Kester P 05/13/2016 11:49 AM

## 2016-05-13 NOTE — ED Notes (Signed)
Patient given scrubs, socks, and belonging bag and instructed to change out.

## 2016-05-14 ENCOUNTER — Emergency Department (HOSPITAL_COMMUNITY): Payer: Medicare Other

## 2016-05-14 DIAGNOSIS — R45851 Suicidal ideations: Secondary | ICD-10-CM | POA: Diagnosis not present

## 2016-05-14 DIAGNOSIS — Z8551 Personal history of malignant neoplasm of bladder: Secondary | ICD-10-CM | POA: Diagnosis not present

## 2016-05-14 DIAGNOSIS — I482 Chronic atrial fibrillation: Secondary | ICD-10-CM | POA: Diagnosis not present

## 2016-05-14 DIAGNOSIS — F330 Major depressive disorder, recurrent, mild: Secondary | ICD-10-CM | POA: Diagnosis not present

## 2016-05-14 DIAGNOSIS — E119 Type 2 diabetes mellitus without complications: Secondary | ICD-10-CM | POA: Diagnosis present

## 2016-05-14 DIAGNOSIS — I480 Paroxysmal atrial fibrillation: Secondary | ICD-10-CM | POA: Diagnosis present

## 2016-05-14 DIAGNOSIS — Z79899 Other long term (current) drug therapy: Secondary | ICD-10-CM | POA: Diagnosis not present

## 2016-05-14 DIAGNOSIS — F4321 Adjustment disorder with depressed mood: Secondary | ICD-10-CM | POA: Diagnosis present

## 2016-05-14 DIAGNOSIS — I4581 Long QT syndrome: Secondary | ICD-10-CM | POA: Diagnosis present

## 2016-05-14 DIAGNOSIS — F332 Major depressive disorder, recurrent severe without psychotic features: Secondary | ICD-10-CM | POA: Diagnosis not present

## 2016-05-14 DIAGNOSIS — Z87891 Personal history of nicotine dependence: Secondary | ICD-10-CM | POA: Diagnosis not present

## 2016-05-14 DIAGNOSIS — F329 Major depressive disorder, single episode, unspecified: Secondary | ICD-10-CM | POA: Diagnosis not present

## 2016-05-14 LAB — URINALYSIS, ROUTINE W REFLEX MICROSCOPIC
BILIRUBIN URINE: NEGATIVE
Glucose, UA: 500 mg/dL — AB
HGB URINE DIPSTICK: NEGATIVE
KETONES UR: NEGATIVE mg/dL
Leukocytes, UA: NEGATIVE
NITRITE: NEGATIVE
Protein, ur: NEGATIVE mg/dL
SPECIFIC GRAVITY, URINE: 1.014 (ref 1.005–1.030)
pH: 6 (ref 5.0–8.0)

## 2016-05-14 MED ORDER — CITALOPRAM HYDROBROMIDE 10 MG PO TABS
10.0000 mg | ORAL_TABLET | Freq: Every day | ORAL | Status: DC
  Filled 2016-05-14: qty 1

## 2016-05-14 MED ORDER — VITAMIN D3 25 MCG (1000 UNIT) PO TABS
4000.0000 [IU] | ORAL_TABLET | Freq: Every day | ORAL | Status: DC
  Administered 2016-05-14: 4000 [IU] via ORAL
  Filled 2016-05-14: qty 4

## 2016-05-14 MED ORDER — MIRABEGRON ER 50 MG PO TB24
50.0000 mg | ORAL_TABLET | Freq: Every day | ORAL | Status: DC
  Administered 2016-05-14: 50 mg via ORAL
  Filled 2016-05-14: qty 1

## 2016-05-14 MED ORDER — DARIFENACIN HYDROBROMIDE ER 15 MG PO TB24
15.0000 mg | ORAL_TABLET | Freq: Every day | ORAL | Status: DC
  Filled 2016-05-14: qty 1

## 2016-05-14 NOTE — Progress Notes (Signed)
Sheriff states that they will be here within the hour.

## 2016-05-14 NOTE — Progress Notes (Signed)
Pt calm and cooperative. Not notified of need for inpatient treatment by MD. Surprised and agitated of this news.

## 2016-05-14 NOTE — Consult Note (Signed)
[facility] Face-to-Face Psychiatry Consult   Reason for Consult:  Suicide threat Referring Physician:  EDP Patient Identification: Craig Whitehead MRN:  [id number] Principal Diagnosis: Major depressive disorder, recurrent severe without psychotic features [facility]) Diagnosis:   Patient Active Problem List   Diagnosis Date Noted  . Major depressive disorder, recurrent severe without psychotic features ([place]) [F33.2] 05/13/2016    Priority: High    Total Time spent with patient: 45 minutes  Subjective:   Craig Whitehead is a 82 y.o. male patient admitted with suicide threat.  HPI:  On admission:  Pt presents voluntarily to [facility] after being referred there from today's am appointment with Dr Ninetta Lights at [place] this am. He had reported plan to shoot himself with his own gun. Pt is pleasant and cooperative. He reports he is no longer suicidal. When asked what changed from his speaking with Dr Ninetta Lights, pt says, "I don't want to spend eternity in hell." He reports his gun is in his car. He reports no social support system. Pt sts, "I was evicted because I had sorry upkeep (of apartment) and a leaky water tank I didn't report. That is a lie, I reported the leaky water tank twice." He says his landlady kicked him out two months ago. Pt sts he is bankrupt. He says he is sleeping in his car or in his chiropractic office. 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 He sts he needs to call his office to cancel today's appointments. He endorses loss of interest in usual pleasures, irritability and worthlessness. Pt also reports hopelessness. He reports good appetite but says he has lost approx. 10 lbs recently. Pt has no upcoming court dates. He has no hx of inpatient or outpatient [place] treatment. He denies hx of substance abuse. He has three children (son & 2 daughters). Pt denies family hx of MI, SA or SI. Pt reports he has difficulty walking and instead "shuffles".  Pt sts he is originally from [place] and went to college in [place].   Patient remains depressed about losing everything and having no place to live.  His daughter does not want him living with her and his friend stated he can only stay there a couple of days.  The patient gave permission to talk to his friend, Rollene Fare, who is concerned about his safety.  He does have a gun in his care, admission needed.  Past Psychiatric History: none  Risk to Self: Suicidal Ideation: No Suicidal Intent: No Is patient at risk for suicide?: Yes Suicidal Plan?:  (pt now denies plan) Access to Means: Yes Specify Access to Suicidal Means: pt told Dr Ninetta Lights today he would shoot himself in head with gun What has been your use of drugs/alcohol within the last 12 months?: one bourbon drink daily Other Self Harm Risks: none Triggers for Past Attempts:  (n/a) Intentional Self Injurious Behavior: None Risk to Others: Homicidal Ideation: No Thoughts of Harm to Others: No Current Homicidal Intent: No Current Homicidal Plan: No Access to Homicidal Means: No Identified Victim: none History of harm to others?: No Assessment of Violence: None Noted Violent Behavior Description: pt denies hx violence Does patient have access to weapons?: Yes (Comment) (has one gun in his car) Criminal Charges Pending?: No Does patient have a court date: No Prior Inpatient Therapy: Prior Inpatient Therapy: No Prior Outpatient Therapy: Prior Outpatient Therapy: No Does patient have an ACCT team?: No Does patient have Intensive In-House Services?  :  No Does patient have [facility] services? : No Does patient have P4CC services?: No  Past Medical History:  Past Medical History:  Diagnosis Date  . Bladder cancer ([place])   . BPH (benign prostatic hypertrophy)   . Diarrhea   . NPH (normal pressure hydrocephalus)   . Vitamin D deficiency     Past Surgical History:  Procedure Laterality Date  . BLADDER SURGERY     x 4 for bladder cancer  . BRAIN SURGERY    .  CATARACT EXTRACTION Bilateral   . INGUINAL HERNIA REPAIR Bilateral   . VENTRICULOPERITONEAL SHUNT  2002   Family History: No family history on file. Family Psychiatric  History: none Social History:  History  Alcohol Use  . 0.0 oz/week    Comment: 1 bourbon daily     History  Drug Use No    Social History   Social History  . Marital status: Divorced    Spouse name: N/A  . Number of children: N/A  . Years of education: N/A   Social History Main Topics  . Smoking status: Former Smoker    Quit date: 07/06/1994  . Smokeless tobacco: Never Used  . Alcohol use 0.0 oz/week     Comment: 1 bourbon daily  . Drug use: No  . Sexual activity: Not Asked   Other Topics Concern  . None   Social History Narrative  . None   Additional Social History:    Allergies:  No Known Allergies  Labs:  Results for orders placed or performed during the hospital encounter of 05/13/16 (from the past 48 hour(s))  Comprehensive metabolic panel     Status: Abnormal   Collection Time: 05/13/16 10:09 AM  Result Value Ref Range   Sodium 140 135 - 145 mmol/L   Potassium 3.6 3.5 - 5.1 mmol/L   Chloride 110 101 - 111 mmol/L   CO2 22 22 - 32 mmol/L   Glucose, Bld 241 (H) 65 - 99 mg/dL   BUN 21 (H) 6 - 20 mg/dL   Creatinine, Ser 1.14 0.61 - 1.24 mg/dL   Calcium 8.8 (L) 8.9 - 10.3 mg/dL   Total Protein 6.8 6.5 - 8.1 g/dL   Albumin 4.1 3.5 - 5.0 g/dL   AST 24 15 - 41 U/L   ALT 25 17 - 63 U/L   Alkaline Phosphatase 71 38 - 126 U/L   Total Bilirubin 0.7 0.3 - 1.2 mg/dL   GFR calc non Af Amer 56 (L) >60 mL/min   GFR calc Af Amer >60 >60 mL/min    Comment: (NOTE) The eGFR has been calculated using the CKD EPI equation. This calculation has not been validated in all clinical situations. eGFR's persistently <60 mL/min signify possible Chronic Kidney Disease.    Anion gap 8 5 - 15  Ethanol     Status: None   Collection Time: 05/13/16 10:09 AM  Result Value Ref Range   Alcohol, Ethyl (B) <5 <5  mg/dL    Comment:        LOWEST DETECTABLE LIMIT FOR SERUM ALCOHOL IS 5 mg/dL FOR MEDICAL PURPOSES ONLY   Salicylate level     Status: None   Collection Time: 05/13/16 10:09 AM  Result Value Ref Range   Salicylate Lvl <5.8 2.8 - 30.0 mg/dL  Acetaminophen level     Status: Abnormal   Collection Time: 05/13/16 10:09 AM  Result Value Ref Range   Acetaminophen (Tylenol), Serum <10 (L) 10 - 30 ug/mL    Comment:  THERAPEUTIC CONCENTRATIONS VARY SIGNIFICANTLY. A RANGE OF 10-30 ug/mL MAY BE AN EFFECTIVE CONCENTRATION FOR MANY PATIENTS. HOWEVER, SOME ARE BEST TREATED AT CONCENTRATIONS OUTSIDE THIS RANGE. ACETAMINOPHEN CONCENTRATIONS >150 ug/mL AT 4 HOURS AFTER INGESTION AND >50 ug/mL AT 12 HOURS AFTER INGESTION ARE OFTEN ASSOCIATED WITH TOXIC REACTIONS.   cbc     Status: Abnormal   Collection Time: 05/13/16 10:09 AM  Result Value Ref Range   WBC 6.5 4.0 - 10.5 K/uL   RBC 3.69 (L) 4.22 - 5.81 MIL/uL   Hemoglobin 12.3 (L) 13.0 - 17.0 g/dL   HCT 37.4 (L) 39.0 - 52.0 %   MCV 101.4 (H) 78.0 - 100.0 fL   MCH 33.3 26.0 - 34.0 pg   MCHC 32.9 30.0 - 36.0 g/dL   RDW 13.9 11.5 - 15.5 %   Platelets 223 150 - 400 K/uL  TSH     Status: None   Collection Time: 05/13/16 10:09 AM  Result Value Ref Range   TSH 1.430 0.350 - 4.500 uIU/mL    Comment: Performed by a 3rd Generation assay with a functional sensitivity of <=0.01 uIU/mL.  Rapid urine drug screen (hospital performed)     Status: None   Collection Time: 05/13/16  1:00 PM  Result Value Ref Range   Opiates NONE DETECTED NONE DETECTED   Cocaine NONE DETECTED NONE DETECTED   Benzodiazepines NONE DETECTED NONE DETECTED   Amphetamines NONE DETECTED NONE DETECTED   Tetrahydrocannabinol NONE DETECTED NONE DETECTED   Barbiturates NONE DETECTED NONE DETECTED    Comment:        DRUG SCREEN FOR MEDICAL PURPOSES ONLY.  IF CONFIRMATION IS NEEDED FOR ANY PURPOSE, NOTIFY LAB WITHIN 5 DAYS.        LOWEST DETECTABLE LIMITS FOR URINE  DRUG SCREEN Drug Class       Cutoff (ng/mL) Amphetamine      1000 Barbiturate      200 Benzodiazepine   093 Tricyclics       235 Opiates          300 Cocaine          300 THC              50   Urinalysis, Routine w reflex microscopic (not at [facility])     Status: Abnormal   Collection Time: 05/14/16 12:02 PM  Result Value Ref Range   Color, Urine YELLOW YELLOW   APPearance CLEAR CLEAR   Specific Gravity, Urine 1.014 1.005 - 1.030   pH 6.0 5.0 - 8.0   Glucose, UA 500 (A) NEGATIVE mg/dL   Hgb urine dipstick NEGATIVE NEGATIVE   Bilirubin Urine NEGATIVE NEGATIVE   Ketones, ur NEGATIVE NEGATIVE mg/dL   Protein, ur NEGATIVE NEGATIVE mg/dL   Nitrite NEGATIVE NEGATIVE   Leukocytes, UA NEGATIVE NEGATIVE    Comment: MICROSCOPIC NOT DONE ON URINES WITH NEGATIVE PROTEIN, BLOOD, LEUKOCYTES, NITRITE, OR GLUCOSE <1000 mg/dL.    Current Facility-Administered Medications  Medication Dose Route Frequency Provider Last Rate Last Dose  . cholecalciferol (VITAMIN D) tablet 4,000 Units  4,000 Units Oral Daily Patrecia Pour, NP   4,000 Units at 05/14/16 1306  . citalopram (CELEXA) tablet 10 mg  10 mg Oral Daily Patrecia Pour, NP      . darifenacin (ENABLEX) 24 hr tablet 15 mg  15 mg Oral Daily Patrecia Pour, NP      . mirabegron ER (MYRBETRIQ) tablet 50 mg  50 mg Oral Daily Patrecia Pour, NP   50  mg at 05/14/16 1306   Current Outpatient Prescriptions  Medication Sig Dispense Refill  . cholecalciferol (VITAMIN D) 1000 UNITS tablet Take 4,000 Units by mouth daily.    . mirabegron ER (MYRBETRIQ) 50 MG TB24 tablet Take 50 mg by mouth daily.    . solifenacin (VESICARE) 10 MG tablet Take 10 mg by mouth daily.    . carbidopa-levodopa (SINEMET IR) 25-100 MG per tablet Take 1 tablet by mouth 3 (three) times daily. (Patient not taking: Reported on 05/13/2016) 90 tablet 2    Musculoskeletal: Strength & Muscle Tone: within normal limits Gait & Station: normal Patient leans: N/A  Psychiatric Specialty  Exam: Physical Exam  Constitutional: He is oriented to person, place, and time. He appears well-developed and well-nourished.  HENT:  Head: Normocephalic.  Neck: Normal range of motion.  Respiratory: Effort normal.  Musculoskeletal: Normal range of motion.  Neurological: He is alert and oriented to person, place, and time.  Skin: Skin is warm and dry.  Psychiatric: His speech is normal and behavior is normal. Judgment normal. Cognition and memory are normal. He exhibits a depressed mood. He expresses suicidal ideation. He expresses suicidal plans.    Review of Systems  Constitutional: Negative.   HENT: Negative.   Eyes: Negative.   Respiratory: Negative.   Cardiovascular: Negative.   Gastrointestinal: Negative.   Genitourinary: Negative.   Musculoskeletal: Negative.   Skin: Negative.   Neurological: Negative.   Endo/Heme/Allergies: Negative.   Psychiatric/Behavioral: Positive for depression and suicidal ideas.    Blood pressure 148/62, pulse 91, temperature 98.3 F (36.8 C), temperature source Oral, resp. rate 18, height 5' 7"  (1.702 m), weight 69.4 kg (153 lb), SpO2 100 %.Body mass index is 23.96 kg/m.  General Appearance: Casual  Eye Contact:  Fair  Speech:  Normal Rate  Volume:  Decreased  Mood:  Depressed  Affect:  Congruent  Thought Process:  Coherent and Descriptions of Associations: Intact  Orientation:  Full (Time, Place, and Person)  Thought Content:  Rumination  Suicidal Thoughts:  Yes.  with intent/plan  Homicidal Thoughts:  No  Memory:  Immediate;   Fair Recent;   Fair Remote;   Fair  Judgement:  Poor  Insight:  Fair  Psychomotor Activity:  Decreased  Concentration:  Concentration: Fair and Attention Span: Fair  Recall:  [employer] of Knowledge:  Fair  Language:  Good  Akathisia:  No  Handed:  Right  AIMS (if indicated):     Assets:  Leisure Time Resilience  ADL's:  Intact  Cognition:  WNL  Sleep:        Treatment Plan Summary: Daily contact  with patient to assess and evaluate symptoms and progress in treatment, Medication management and Plan major depressive disorder, recurrent, severe without psychosis: -Crisis stabilization -Medication management:  Restarted his medical medications and started Celexa 10 mg daily for depression -Individual counseling  Disposition: Recommend psychiatric Inpatient admission when medically cleared.  Waylan Boga, NP 05/14/2016 5:16 PM  Patient seen face-to-face for psychiatric evaluation, chart reviewed and case discussed with the physician extender and developed treatment plan. Reviewed the information documented and agree with the treatment plan. Corena Pilgrim, MD

## 2016-05-14 NOTE — ED Notes (Signed)
Patent resting in bed, eyes closed, even rise and fall of chest. NAD noted.

## 2016-05-14 NOTE — Progress Notes (Signed)
Sheriff called for transport to [facility].

## 2016-05-14 NOTE — BH Assessment (Signed)
[place] Assessment Progress Note  Per Corena Pilgrim, MD, this pt requires psychiatric hospitalization at this time.  The following facilities have been contacted to seek placement for this pt, with results as noted:  Beds available, information sent, decision pending:  [place]:  [facility], [place] Triage Specialist [phone]

## 2016-05-22 HISTORY — PX: TRANSTHORACIC ECHOCARDIOGRAM: SHX275

## 2016-05-28 ENCOUNTER — Encounter: Payer: Self-pay | Admitting: Cardiology

## 2016-05-29 ENCOUNTER — Telehealth: Payer: Self-pay | Admitting: Cardiology

## 2016-05-29 NOTE — Telephone Encounter (Signed)
Received records from [facility] for appointment on 05/31/16 with Dr Ellyn Hack.  Records given to [facility] (medical records) for Dr Allison Quarry schedule on 05/31/16. lp

## 2016-05-31 ENCOUNTER — Ambulatory Visit (INDEPENDENT_AMBULATORY_CARE_PROVIDER_SITE_OTHER): Payer: Medicare Other | Admitting: Cardiology

## 2016-05-31 ENCOUNTER — Encounter: Payer: Self-pay | Admitting: Cardiology

## 2016-05-31 DIAGNOSIS — I481 Persistent atrial fibrillation: Secondary | ICD-10-CM | POA: Diagnosis not present

## 2016-05-31 DIAGNOSIS — E1340 Other specified diabetes mellitus with diabetic neuropathy, unspecified: Secondary | ICD-10-CM | POA: Diagnosis not present

## 2016-05-31 DIAGNOSIS — R270 Ataxia, unspecified: Secondary | ICD-10-CM | POA: Diagnosis not present

## 2016-05-31 DIAGNOSIS — I499 Cardiac arrhythmia, unspecified: Secondary | ICD-10-CM | POA: Diagnosis not present

## 2016-05-31 DIAGNOSIS — R45851 Suicidal ideations: Secondary | ICD-10-CM | POA: Diagnosis not present

## 2016-05-31 DIAGNOSIS — G914 Hydrocephalus in diseases classified elsewhere: Secondary | ICD-10-CM | POA: Diagnosis not present

## 2016-05-31 DIAGNOSIS — E1165 Type 2 diabetes mellitus with hyperglycemia: Secondary | ICD-10-CM | POA: Diagnosis not present

## 2016-05-31 DIAGNOSIS — R159 Full incontinence of feces: Secondary | ICD-10-CM | POA: Diagnosis not present

## 2016-05-31 DIAGNOSIS — I4819 Other persistent atrial fibrillation: Secondary | ICD-10-CM

## 2016-05-31 DIAGNOSIS — Z79899 Other long term (current) drug therapy: Secondary | ICD-10-CM

## 2016-05-31 DIAGNOSIS — I4891 Unspecified atrial fibrillation: Secondary | ICD-10-CM | POA: Diagnosis not present

## 2016-05-31 DIAGNOSIS — E559 Vitamin D deficiency, unspecified: Secondary | ICD-10-CM | POA: Diagnosis not present

## 2016-05-31 NOTE — Patient Instructions (Signed)
NEED BMP TODAY   WILL GET ECHO FROM [place] BEFORE NEXT APPOINTMENT   Your physician recommends that you schedule a follow-up appointment in: [place] 2017- 30 MIN

## 2016-05-31 NOTE — Progress Notes (Signed)
PCP: Henrine Screws, MD  Clinic Note: Chief Complaint  Patient presents with  . 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 Apparently he was admitted there with concerns of suicidal ideation - he was observed 48 hours and discharged. While there he was noted to have atrial fibrillation. This was rate controlled on no medications. He has been on aspirin 81 mg daily. He was asymptomatic at the time. Therefore they recommended outpatient consultation.  Recent Hospitalizations: [facility] from 10/24 to 05/17/2016  Studies Reviewed: - No studies available,  An echocardiogram was done during the hospitalization, but not available.  This patients CHA2DS2-VASc Score and unadjusted Ischemic Stroke Rate (% per year) is equal to 2.2 % stroke rate/year from a score of 2  Above score calculated as 1 point each if present [CHF, HTN, DM, Vascular=MI/PAD/Aortic Plaque, Age if 49-74, or Male];  2 points each if present [Age > 75, or Stroke/TIA/TE]  Interval History: Craig Whitehead presents here today completely asymptomatic with the being in atrial fibrillation. He has no sense of any irregular rapid heartbeat. He does not have any sensation of any shortness of breath with rest or exertion. He walks with a somewhat shuffling gait because of his NPH, but has not had any falls. He has had some relief of the VP shunt, but still has some balance issues. He denies any resting or exertional dyspnea or chest pain. No PND, orthopnea or edema. No palpitations or syncope/near syncope, TIAs amaurosis fugax symptoms.   No melena, hematochezia, hematuria, or epstaxis. No  claudication.   ROS: A comprehensive was performed. Review of Systems  Constitutional: Negative for chills, fever and malaise/fatigue.  HENT: Negative for congestion and nosebleeds.   Respiratory: Negative for cough, shortness of breath and wheezing.   Cardiovascular: Negative.        Per history of present illness  Gastrointestinal: Negative for blood in stool, heartburn and melena.  Genitourinary: Positive for hematuria (Related to bladder cancer).  Musculoskeletal: Negative for back pain, falls, joint pain and myalgias.  Skin: Negative.   Neurological: Positive for dizziness (From NPH).       Walks with a shuffling gait  Endo/Heme/Allergies: Negative for environmental allergies.  Psychiatric/Behavioral: Positive for depression (Per his discharge from the hospital he is felt to have adjustment disorder with depressed mood.). Negative for memory loss. The patient is not nervous/anxious and does not have insomnia.   All other systems reviewed and are negative.   Past Medical History:  Diagnosis Date  . Bladder cancer ([place])   . BPH (benign prostatic hypertrophy)   . Diarrhea   . Difficult intubation   . NPH (normal pressure hydrocephalus)   . Vitamin D deficiency     Past Surgical History:  Procedure Laterality Date  . BLADDER SURGERY     x 4 for bladder cancer  . BRAIN SURGERY    . CATARACT EXTRACTION Bilateral   . INGUINAL HERNIA REPAIR Bilateral   . VENTRICULOPERITONEAL SHUNT  2002    Home Medications  Medication Sig Taking? Authorizing Provider  cholecalciferol (VITAMIN D) 1000 UNITS tablet Take 4,000 Units by mouth daily. Yes Historical Provider, MD  dicyclomine (BENTYL) 10 MG capsule Take  10 mg to 20 mg by mouth daily as needed prior to traveling outside the home to prevent colonic spasm. Yes Historical Provider, MD  mirabegron ER (MYRBETRIQ) 50 MG TB24 tablet Take 50 mg by mouth daily. Yes Historical Provider, MD  solifenacin (VESICARE) 10 MG tablet Take 10 mg by  mouth daily. Yes Historical Provider, MD   No Known Allergies   Social History   Social History  . Marital status: Divorced    Spouse name: N/A  . Number of children: N/A  . Years of education: N/A   Occupational History  .      Partially retired Restaurant manager, fast food   Social History Main Topics  . Smoking status: Former Smoker    Quit date: 07/06/1994  . Smokeless tobacco: Never Used  . Alcohol use 0.6 oz/week    1 Shots of liquor per week     Comment: 1 bourbon daily  . Drug use: No  . Sexual activity: Not Asked   Other Topics Concern  . None   Social History Narrative   He has a degree in chiropractic medicine. He practice [employer] and for years, then came back at a retirement because he enjoyed doing it. Line he quit smoking 50 years ago. He drinks 1 drink of bourbon a day.      The patient apparently had been evicted from his apartment by his landlord for unclamping this. He is therefore been living in his office that he uses for his chiropractic practice. He also has been living in his car.    He apparently has a significant other/girlfriend that he may be able to move in with.   Family History  Problem Relation Age of Onset  . Rheum arthritis Mother   . Cancer Father     Wt Readings from Last 3 Encounters:  05/31/16 69.5 kg (153 lb 3.2 oz)  05/13/16 69.4 kg (153 lb)  09/06/14 71.2 kg (157 lb)    PHYSICAL EXAM BP 106/70   Pulse 89   Ht 5\' 7"  (1.702 m)   Wt 69.5 kg (153 lb 3.2 oz)   BMI 23.99 kg/m  General appearance: Thin, but otherwise healthy-appearing elderly gentleman who does appear younger than his stated age. In no acute distress. Well-groomed groomed. Normal mood and affect. HEENT: [place]/AT, EOMI, MMM, anicteric sclera; he does have the surgical scar and impression of the VP shunt notable on his right forehead and scalp Neck: no adenopathy, no carotid bruit and no JVD Lungs: clear to auscultation bilaterally, normal percussion bilaterally and  non-labored Heart: Irregularly irregular rhythm but normal rate. Normal S1 and S2 with no M/R/G Abdomen: soft, non-tender; bowel sounds normal; no masses,  no organomegaly;  Extremities: extremities normal, atraumatic, no cyanosis, or edema  Pulses: 2+ and symmetric;  Skin: mobility and turgor normal, no edema and no evidence of bleeding or bruising  Neurologic: Mental status: Alert, oriented, thought content appropriate Cranial nerves: normal (II-XII grossly intact)    Adult ECG Report  Rate: 89 ;  Rhythm: atrial fibrillation and Controlled rate. Borderline control prolonged QT. Borderline left axis deviation (-19).  ;   Narrative Interpretation: Abnormal because of A. fib   Other studies Reviewed: Additional studies/ records that were reviewed today include:  Recent Labs:  None available    ASSESSMENT / PLAN: Problem List Items Addressed This Visit    Persistent atrial fibrillation ([place]): CHA2DS2Vasc 2 (age). Rate controlled without medications asymptomatic (Chronic)    There is no telling how long Mr. Radford Pax  has been in atrial fibrillation. He is totally asymptomatic on no medications for rate control. I do not see any record history of hypertension, diabetes, CHF, vascular disease (although he probably does have some), or stroke. He apparently had an echocardiogram performed at [facility]. Unfortunately I do not have that result. If he did not have an echo, then we would probably at least one to get an echocardiogram. Within being totally asymptomatic and no symptoms whatsoever to suggest angina or heart failure, I would be reluctant to pursue a stress test on him  His risk of stroke is roughly 2% per year, and he would be interested in anticoagulation. I will need to confirm with his urologist (who I think is Dr. Louis Meckel) that it is safe for him to be on endocrine relation with some hematuria from a ladder cancer.. I would also get a sense from his PCP and even potentially his  neurologist about his stability given his normal pressure hydrocephalus.  My thought is that we would probably need with ELIQUIS, will need to know his renal function prior to starting      Medication management    We need renal function prior to determining appropriate dose of ELIQUIS.      Relevant Orders   Basic metabolic panel   EKG [id number]      Current medicines are reviewed at length with the patient today. (+/- concerns) Should he be on something for atrial fibrillation The following changes have been made: We'll consider starting Eliquis after we discussed bleeding risk with urologist, neurologist and PCP  Patient Instructions  NEED BMP TODAY   WILL GET ECHO FROM [place]   Your physician recommends that you schedule a follow-up appointment in: [place] 2017- 30 MIN    Studies Ordered:   Orders Placed This Encounter  Procedures  . Basic metabolic panel  . EKG 12-Lead      Glenetta Hew, M.D., M.S. Interventional Cardiologist   Pager # [phone] Phone # [phone] [address] [place], [place] [address]

## 2016-06-02 ENCOUNTER — Encounter: Payer: Self-pay | Admitting: Cardiology

## 2016-06-02 DIAGNOSIS — I4819 Other persistent atrial fibrillation: Secondary | ICD-10-CM

## 2016-06-02 DIAGNOSIS — I4821 Permanent atrial fibrillation: Secondary | ICD-10-CM | POA: Insufficient documentation

## 2016-06-02 NOTE — Assessment & Plan Note (Addendum)
There is no telling how long Craig Whitehead has been in atrial fibrillation. He is totally asymptomatic on no medications for rate control. I do not see any record history of hypertension, diabetes, CHF, vascular disease (although he probably does have some), or stroke. He apparently had an echocardiogram performed at [facility]. Unfortunately I do not have that result. If he did not have an echo, then we would probably at least one to get an echocardiogram. Within being totally asymptomatic and no symptoms whatsoever to suggest angina or heart failure, I would be reluctant to pursue a stress test on him  His risk of stroke is roughly 2% per year, and he would be interested in anticoagulation. I will need to confirm with his urologist (who I think is Dr. Louis Meckel) that it is safe for him to be on endocrine relation with some hematuria from a ladder cancer.. I would also get a sense from his PCP and even potentially his neurologist about his stability given his normal pressure hydrocephalus.  My thought is that we would probably need with ELIQUIS, will need to know his renal function prior to starting

## 2016-06-02 NOTE — Assessment & Plan Note (Signed)
We need renal function prior to determining appropriate dose of ELIQUIS.

## 2016-06-07 ENCOUNTER — Encounter: Payer: Self-pay | Admitting: Gastroenterology

## 2016-06-07 DIAGNOSIS — E1340 Other specified diabetes mellitus with diabetic neuropathy, unspecified: Secondary | ICD-10-CM | POA: Diagnosis not present

## 2016-06-07 DIAGNOSIS — I499 Cardiac arrhythmia, unspecified: Secondary | ICD-10-CM | POA: Diagnosis not present

## 2016-06-07 DIAGNOSIS — E1165 Type 2 diabetes mellitus with hyperglycemia: Secondary | ICD-10-CM | POA: Diagnosis not present

## 2016-06-07 DIAGNOSIS — E559 Vitamin D deficiency, unspecified: Secondary | ICD-10-CM | POA: Diagnosis not present

## 2016-06-07 DIAGNOSIS — R159 Full incontinence of feces: Secondary | ICD-10-CM | POA: Diagnosis not present

## 2016-06-07 DIAGNOSIS — R45851 Suicidal ideations: Secondary | ICD-10-CM | POA: Diagnosis not present

## 2016-06-07 DIAGNOSIS — G914 Hydrocephalus in diseases classified elsewhere: Secondary | ICD-10-CM | POA: Diagnosis not present

## 2016-06-07 DIAGNOSIS — I4891 Unspecified atrial fibrillation: Secondary | ICD-10-CM | POA: Diagnosis not present

## 2016-06-07 DIAGNOSIS — R270 Ataxia, unspecified: Secondary | ICD-10-CM | POA: Diagnosis not present

## 2016-06-26 NOTE — Progress Notes (Signed)
PCP: Henrine Screws, MD UROLOGIST: Ardis Hughs, MD   Clinic Note: Chief Complaint  Patient presents with  . Follow-up    Atrial fibrillation  . Edema    pt states both feet     HPI: Craig Whitehead is a 82 y.o. 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 He basically had asymmetric atrial fibrillation found during hospitalization. We discussed the possibility of initiating anticoagulation when I saw him last. He was started on aspirin during his hospital stay.  Craig Whitehead was initially seen in consultation back on November 10.  Recent Hospitalizations: [facility] from 10/24 to 05/17/2016 -- Admitted for concern for possible suicidal ideation. Following the imaging department. Cardiology consulted for A. fib. This can be outpatient management started on aspirin. It would appear that he was diagnosed with having arrhythmia several months prior to his hospitalization. While in the hospital his heart rates were ranging 57-120  Studies Reviewed:   2 D Echo from [place] - reviewed; Updated in [facility] (see below)  This patients CHA2DS2-VASc Score and unadjusted Ischemic Stroke Rate (% per year) is equal to 2.2 % stroke rate/year from a score of 2:;  Word from his urologist indicated that he should be fine with starting anticoagulation. "He has a history of bladder cancer, but hasn't had a recurrence in a while. He is at low risk for recurrence this far out.  I would not worry about hematuria when contemplating anticoagulation for this patient. Obviously if he does have some hematuria it would prompt looking more closely again in his bladder." Above score calculated as 1 point each if present [CHF, HTN, DM, Vascular=MI/PAD/Aortic Plaque, Age if 65-74, or Male];  2 points each if present [Age > 75, or Stroke/TIA/TE]  Interval History: Craig Whitehead returns  today still completely symptomatically from A. fib. No sensation of rapid irregular heartbeats or palpitations. No sensation of heart failure symptoms of PND, orthopnea or even dyspnea on exertion. He has a baseline slow shuffling gait from his history of NPH, but has not had any falls. He does have mild bilateral foot edema, but this is chronic and not bothersome to him. He is not had any syncope or near syncope, TIA or amaurosis fugax symptoms. No melena, hematochezia, hematuria, or epstaxis. No claudication.  ROS: A comprehensive was performed. Review of Systems  Constitutional: Negative for chills, fever and malaise/fatigue.  HENT: Negative for congestion and nosebleeds.   Respiratory: Negative for cough, shortness of breath and wheezing.   Cardiovascular: Negative.        Per history of present illness  Gastrointestinal: Negative for blood in stool, heartburn and melena.  Genitourinary: Positive for hematuria (Related to bladder cancer - but none in over 2 months).  Musculoskeletal: Negative for back pain, falls, joint pain and myalgias.  Skin: Negative.   Neurological: Positive for dizziness (From NPH).       Walks with a shuffling gait  Endo/Heme/Allergies: Negative for environmental allergies.  Psychiatric/Behavioral: Positive for depression (Per his discharge from the hospital he is felt to have adjustment disorder with depressed mood.). Negative for memory loss. The patient is not nervous/anxious and does not have insomnia.        He indicates that he knows it is a 82 doesn't have too much longer to live. He seems to feel that there were things in life and death, but has no suicidal  ideation.  All other systems reviewed and are negative.   Past Medical History:  Diagnosis Date  . Bladder cancer ([place])   . BPH (benign prostatic hypertrophy)   . Diarrhea   . Difficult intubation   . NPH (normal pressure hydrocephalus)   . Vitamin D deficiency     Past Surgical History:    Procedure Laterality Date  . BLADDER SURGERY     x 4 for bladder cancer  . BRAIN SURGERY    . CATARACT EXTRACTION Bilateral   . INGUINAL HERNIA REPAIR Bilateral   . TRANSTHORACIC ECHOCARDIOGRAM  05/2016   [facility]: EF 45-50%. Mildly reduced function (likely related to A. fib). Marketed LA dilation. Moderate RA dilation. Aortic sclerosis without stenosis. Mild-moderate TR. Mild-moderate pulmonary tension. Small pericardial effusion versus pericardial fat  . VENTRICULOPERITONEAL SHUNT  2002    Current Meds  Medication Sig  . cholecalciferol (VITAMIN D) 1000 UNITS tablet Take 4,000 Units by mouth daily.  Marland Kitchen dicyclomine (BENTYL) 10 MG capsule Take 10 mg to 20 mg by mouth daily as needed prior to traveling outside the home to prevent colonic spasm.  . mirabegron ER (MYRBETRIQ) 50 MG TB24 tablet Take 50 mg by mouth daily.  . solifenacin (VESICARE) 10 MG tablet Take 10 mg by mouth daily.    No Known Allergies   Social History   Social History  . Marital status: Divorced    Spouse name: N/A  . Number of children: N/A  . Years of education: N/A   Occupational History  .      Partially retired Restaurant manager, fast food   Social History Main Topics  . Smoking status: Former Smoker    Quit date: 07/06/1994  . Smokeless tobacco: Never Used  . Alcohol use 0.6 oz/week    1 Shots of liquor per week     Comment: 1 bourbon daily  . Drug use: No  . Sexual activity: Not Asked   Other Topics Concern  . None   Social History Narrative   He has a degree in chiropractic medicine. He practice [employer] and for years, then came back at a retirement because he enjoyed doing it. Line he quit smoking 50 years ago. He drinks 1 drink of bourbon a day.      The patient apparently had been evicted from his apartment by his landlord for unclamping this. He is therefore been living in his office that he uses for his chiropractic practice. He also has been living in his car.    He  apparently has a significant other/girlfriend that he may be able to move in with.   Family History  Problem Relation Age of Onset  . Rheum arthritis Mother   . Cancer Father     Wt Readings from Last 3 Encounters:  06/27/16 68.4 kg (150 lb 12.8 oz)  05/31/16 69.5 kg (153 lb 3.2 oz)  05/13/16 69.4 kg (153 lb)    PHYSICAL EXAM BP (!) 144/88   Pulse 83   Ht 5\' 7"  (1.702 m)   Wt 68.4 kg (150 lb 12.8 oz)   SpO2 99%   BMI 23.62 kg/m  General appearance: Thin, but otherwise healthy-appearing elderly gentleman who does appear younger than his stated age. In no acute distress. Well-groomed groomed. Normal mood and affect. HEENT: [place]/AT, EOMI, MMM, anicteric sclera; he does have the surgical scar and impression of the VP shunt notable on his right forehead and scalp Neck: no adenopathy, no carotid bruit and no JVD Lungs: clear  to auscultation bilaterally, normal percussion bilaterally and non-labored Heart: Irregularly irregular rhythm but normal rate. Normal S1 and S2 with no M/R/G Abdomen: soft, non-tender; bowel sounds normal; no masses,  no organomegaly;  Extremities: extremities normal, atraumatic, no cyanosis, or edema  Pulses: 2+ and symmetric;  Skin: mobility and turgor normal, no edema and no evidence of bleeding or bruising  Neurologic: Mental status: Alert, oriented, thought content appropriate Cranial nerves: normal (II-XII grossly intact)    Adult ECG Report Not checked  Other studies Reviewed: Additional studies/ records that were reviewed today include:  Recent Labs:  None available Lab Results  Component Value Date   CREATININE 1.14 05/13/2016   BUN 21 (H) 05/13/2016   NA 140 05/13/2016   K 3.6 05/13/2016   CL 110 05/13/2016   CO2 22 05/13/2016   Cholesterol panel 05/15/2016: Total cholesterol 131, triglycerides 104, HDL 70; glucose 196.   ASSESSMENT / PLAN: Problem List Items Addressed This Visit    Persistent atrial fibrillation ([place]): CHA2DS2Vasc 2  (age). Rate controlled without medications asymptomatic - Primary (Chronic)    Relatively asymptomatic. I think is slightly reduced EF is probably because of difficulty reading EF with A. Fib. We reviewed the risks of stroke and bleeding today in detail. I discussed with him options of using Eliquis or Xarelto versus simply stay on aspirin. He is concerned about the cost of being on a medication that will give him a modest amount of protection in his estimation. He is otherwise asymptomatic and has not had any sensation of rapid beats. Plan: Based on his desire we will stay with him simply taking a 1 mg aspirin. He is rate controlled without any medications, and somewhat fearful for bradycardia. I will simply provide prescription for when necessary diltiazem 60 mg short acting which he can take up to 3 tablets a day every 6 hours.       Relevant Medications   aspirin EC 81 MG tablet   diltiazem (CARDIZEM) 60 MG tablet      Current medicines are reviewed at length with the patient today. (+/- concerns) Should he be on something for atrial fibrillation The following changes have been made: We'll consider starting Eliquis after we discussed bleeding risk with urologist, neurologist and PCP  Patient Instructions  Take ASPIRIN 81 MG ONE TABLET DAILY   May take  DILTIAZEM  60 MG   IF HEART RATE INCREASE UP TO 100 - 110 , IF SENSATION  OR SHORTNESS OF BREATHE. MAY USE UP TO 3 TIMES IN ONE DAY (8 HOURS APART)    Your physician recommends that you schedule a follow-up appointment in:12 MONTHS  WITH DR Chico Cawood-- IF NO SYMPTOMS IN THE NEXT YEAR ,CAN BE SEEN ON AN AS NEEDED BASIS    Studies Ordered:   No orders of the defined types were placed in this encounter.     Glenetta Hew, M.D., M.S. Interventional Cardiologist   Pager # [phone] Phone # [phone] [address] [place], [place] [address]

## 2016-06-27 ENCOUNTER — Ambulatory Visit (INDEPENDENT_AMBULATORY_CARE_PROVIDER_SITE_OTHER): Payer: Medicare Other | Admitting: Cardiology

## 2016-06-27 ENCOUNTER — Encounter: Payer: Self-pay | Admitting: Cardiology

## 2016-06-27 VITALS — BP 144/88 | HR 83 | Ht 67.0 in | Wt 150.8 lb

## 2016-06-27 DIAGNOSIS — I481 Persistent atrial fibrillation: Secondary | ICD-10-CM

## 2016-06-27 DIAGNOSIS — I4819 Other persistent atrial fibrillation: Secondary | ICD-10-CM

## 2016-06-27 MED ORDER — ASPIRIN EC 81 MG PO TBEC: 81.0000 mg | DELAYED_RELEASE_TABLET | Freq: Every day | ORAL | 3 refills | Status: DC

## 2016-06-27 MED ORDER — DILTIAZEM HCL 60 MG PO TABS: 60.0000 mg | ORAL_TABLET | Freq: Three times a day (TID) | ORAL | 6 refills | Status: DC | PRN

## 2016-06-27 NOTE — Assessment & Plan Note (Signed)
Relatively asymptomatic. I think is slightly reduced EF is probably because of difficulty reading EF with A. Fib. We reviewed the risks of stroke and bleeding today in detail. I discussed with him options of using Eliquis or Xarelto versus simply stay on aspirin. He is concerned about the cost of being on a medication that will give him a modest amount of protection in his estimation. He is otherwise asymptomatic and has not had any sensation of rapid beats. Plan: Based on his desire we will stay with him simply taking a 1 mg aspirin. He is rate controlled without any medications, and somewhat fearful for bradycardia. I will simply provide prescription for when necessary diltiazem 60 mg short acting which he can take up to 3 tablets a day every 6 hours.

## 2016-06-27 NOTE — Patient Instructions (Addendum)
Take ASPIRIN 81 MG ONE TABLET DAILY   May take  DILTIAZEM  60 MG   IF HEART RATE INCREASE UP TO 100 - 110 , IF SENSATION  OR SHORTNESS OF BREATHE. MAY USE UP TO 3 TIMES IN ONE DAY (8 HOURS APART)    Your physician recommends that you schedule a follow-up appointment in:12 MONTHS  WITH DR HARDING-- IF NO SYMPTOMS IN THE NEXT YEAR ,CAN BE SEEN ON AN AS NEEDED BASIS

## 2016-06-29 ENCOUNTER — Encounter: Payer: Self-pay | Admitting: Cardiology

## 2016-07-09 DIAGNOSIS — G914 Hydrocephalus in diseases classified elsewhere: Secondary | ICD-10-CM | POA: Diagnosis not present

## 2016-07-09 DIAGNOSIS — R270 Ataxia, unspecified: Secondary | ICD-10-CM | POA: Diagnosis not present

## 2016-07-09 DIAGNOSIS — I4891 Unspecified atrial fibrillation: Secondary | ICD-10-CM | POA: Diagnosis not present

## 2016-07-09 DIAGNOSIS — E1165 Type 2 diabetes mellitus with hyperglycemia: Secondary | ICD-10-CM | POA: Diagnosis not present

## 2016-07-09 DIAGNOSIS — R159 Full incontinence of feces: Secondary | ICD-10-CM | POA: Diagnosis not present

## 2016-07-09 DIAGNOSIS — E1340 Other specified diabetes mellitus with diabetic neuropathy, unspecified: Secondary | ICD-10-CM | POA: Diagnosis not present

## 2016-07-09 DIAGNOSIS — R45851 Suicidal ideations: Secondary | ICD-10-CM | POA: Diagnosis not present

## 2016-07-30 DIAGNOSIS — R159 Full incontinence of feces: Secondary | ICD-10-CM | POA: Diagnosis not present

## 2016-07-30 DIAGNOSIS — Z7984 Long term (current) use of oral hypoglycemic drugs: Secondary | ICD-10-CM | POA: Diagnosis not present

## 2016-07-30 DIAGNOSIS — G914 Hydrocephalus in diseases classified elsewhere: Secondary | ICD-10-CM | POA: Diagnosis not present

## 2016-07-30 DIAGNOSIS — R45851 Suicidal ideations: Secondary | ICD-10-CM | POA: Diagnosis not present

## 2016-07-30 DIAGNOSIS — R270 Ataxia, unspecified: Secondary | ICD-10-CM | POA: Diagnosis not present

## 2016-07-30 DIAGNOSIS — E1340 Other specified diabetes mellitus with diabetic neuropathy, unspecified: Secondary | ICD-10-CM | POA: Diagnosis not present

## 2016-07-30 DIAGNOSIS — E1165 Type 2 diabetes mellitus with hyperglycemia: Secondary | ICD-10-CM | POA: Diagnosis not present

## 2016-07-30 DIAGNOSIS — I4891 Unspecified atrial fibrillation: Secondary | ICD-10-CM | POA: Diagnosis not present

## 2016-08-06 ENCOUNTER — Other Ambulatory Visit: Payer: Medicare Other

## 2016-08-06 ENCOUNTER — Encounter (INDEPENDENT_AMBULATORY_CARE_PROVIDER_SITE_OTHER): Payer: Self-pay

## 2016-08-06 ENCOUNTER — Encounter: Payer: Self-pay | Admitting: Gastroenterology

## 2016-08-06 ENCOUNTER — Ambulatory Visit (INDEPENDENT_AMBULATORY_CARE_PROVIDER_SITE_OTHER): Payer: Medicare Other | Admitting: Gastroenterology

## 2016-08-06 VITALS — BP 130/76 | HR 64 | Ht 67.0 in | Wt 149.6 lb

## 2016-08-06 DIAGNOSIS — R159 Full incontinence of feces: Secondary | ICD-10-CM | POA: Diagnosis not present

## 2016-08-06 DIAGNOSIS — R152 Fecal urgency: Secondary | ICD-10-CM | POA: Diagnosis not present

## 2016-08-06 DIAGNOSIS — R197 Diarrhea, unspecified: Secondary | ICD-10-CM

## 2016-08-06 MED ORDER — CHOLESTYRAMINE 4 G PO PACK: 4.0000 g | PACK | Freq: Every day | ORAL | 11 refills | Status: DC

## 2016-08-06 NOTE — Patient Instructions (Signed)
Your physician has requested that you go to the basement for the following lab work before leaving today: GI pathogen panel.  We have sent the following medications to your pharmacy for you to pick up at your convenience: Questran. Do not start this medication until you return you stool pathogen panel.   Kegel Exercises The goal of Kegel exercises is to isolate and exercise your pelvic floor muscles. These muscles act as a hammock that supports the rectum, vagina, small intestine, and uterus. As the muscles weaken, the hammock sags and these organs are displaced from their normal positions. Kegel exercises can strengthen your pelvic floor muscles and help you to improve bladder and bowel control, improve sexual response, and help reduce many problems and some discomfort during pregnancy. Kegel exercises can be done anywhere and at any time. HOW TO PERFORM KEGEL EXERCISES 1. Locate your pelvic floor muscles. To do this, squeeze (contract) the muscles that you use when you try to stop the flow of urine. You will feel a tightness in the vaginal area (women) and a tight lift in the rectal area (men and women). 2. When you begin, contract your pelvic muscles tight for 2-5 seconds, then relax them for 2-5 seconds. This is one set. Do 4-5 sets with a short pause in between. 3. Contract your pelvic muscles for 8-10 seconds, then relax them for 8-10 seconds. Do 4-5 sets. If you cannot contract your pelvic muscles for 8-10 seconds, try 5-7 seconds and work your way up to 8-10 seconds. Your goal is 4-5 sets of 10 contractions each day. Keep your stomach, buttocks, and legs relaxed during the exercises. Perform sets of both short and long contractions. Vary your positions. Perform these contractions 3-4 times per day. Perform sets while you are:   Lying in bed in the morning.  Standing at lunch.  Sitting in the late afternoon.  Lying in bed at night. You should do 40-50 contractions per day. Do not perform  more Kegel exercises per day than recommended. Overexercising can cause muscle fatigue. Continue these exercises for for at least 15-20 weeks or as directed by your caregiver. This information is not intended to replace advice given to you by your health care provider. Make sure you discuss any questions you have with your health care provider. Document Released: 06/24/2012 Document Revised: 07/29/2014 Document Reviewed: 05/28/2015 Elsevier Interactive Patient Education  2017 [place] you for choosing me and [facility].  Pricilla Riffle. Dagoberto Ligas., MD., Marval Regal

## 2016-08-06 NOTE — Progress Notes (Signed)
History of Present Illness: This is an 83 year old male referred by Josetta Huddle, MD for the evaluation of fecal incontinence and diarrhea. Pt relates intermittent problems for 4 years. He has urgent diarrhea about every 2-3 weeks with resulting incontinence. In between he has normally formed stools without incontinence. Previously treated by Dr. Sammuel Cooper and evaluated for diarrhea in 2008 with records received at [facility] after that evaluation however no prior visits to [facility] were found in EMR. Metformim was stopped several years ago. Denies weight loss, abdominal pain, constipation, change in stool caliber, melena, hematochezia, nausea, vomiting, dysphagia, reflux symptoms, chest pain.  No Known Allergies Outpatient Medications Prior to Visit  Medication Sig Dispense Refill  . aspirin EC 81 MG tablet Take 1 tablet (81 mg total) by mouth daily. 90 tablet 3  . cholecalciferol (VITAMIN D) 1000 UNITS tablet Take 4,000 Units by mouth daily.    . mirabegron ER (MYRBETRIQ) 50 MG TB24 tablet Take 50 mg by mouth daily.    . solifenacin (VESICARE) 10 MG tablet Take 10 mg by mouth daily.    Marland Kitchen dicyclomine (BENTYL) 10 MG capsule Take 10 mg to 20 mg by mouth daily as needed prior to traveling outside the home to prevent colonic spasm.    . diltiazem (CARDIZEM) 60 MG tablet Take 1 tablet (60 mg total) by mouth 3 (three) times daily as needed. ( For fast heart rate)_ 30 tablet 6   No facility-administered medications prior to visit.    Past Medical History:  Diagnosis Date  . Anxiety   . Atrial fibrillation ([place])   . Bladder cancer ([place])   . BPH (benign prostatic hypertrophy)   . Diabetes mellitus ([place])   . Diarrhea   . Difficult intubation   . ED (erectile dysfunction)   . GERD (gastroesophageal reflux disease)   . Hypercholesteremia   . Hypertension   . NPH (normal pressure hydrocephalus)   . Seborrheic dermatitis   . Vitamin D deficiency    Past Surgical History:  Procedure Laterality  Date  . BLADDER SURGERY     x 4 for bladder cancer  . BRAIN SURGERY    . CATARACT EXTRACTION Bilateral   . INGUINAL HERNIA REPAIR Bilateral   . TRANSTHORACIC ECHOCARDIOGRAM  05/2016   [facility]: EF 45-50%. Mildly reduced function (likely related to A. fib). Marketed LA dilation. Moderate RA dilation. Aortic sclerosis without stenosis. Mild-moderate TR. Mild-moderate pulmonary tension. Small pericardial effusion versus pericardial fat  . VENTRICULOPERITONEAL SHUNT  2002   Social History   Social History  . Marital status: Divorced    Spouse name: N/A  . Number of children: N/A  . Years of education: N/A   Occupational History  .      Partially retired Restaurant manager, fast food   Social History Main Topics  . Smoking status: Former Smoker    Quit date: 07/06/1994  . Smokeless tobacco: Never Used  . Alcohol use 0.6 oz/week    1 Shots of liquor per week     Comment: 1 bourbon daily  . Drug use: No  . Sexual activity: Not Asked   Other Topics Concern  . None   Social History Narrative   He has a degree in chiropractic medicine. He practice [employer] and for years, then came back at a retirement because he enjoyed doing it. Line he quit smoking 50 years ago. He drinks 1 drink of bourbon a day.      The patient apparently had been  evicted from his apartment by his landlord for unclamping this. He is therefore been living in his office that he uses for his chiropractic practice. He also has been living in his car.    He apparently has a significant other/girlfriend that he may be able to move in with.   Family History  Problem Relation Age of Onset  . Rheum arthritis Mother   . Cancer Father       Review of Systems: Pertinent positive and negative review of systems were noted in the above HPI section. All other review of systems were otherwise negative.   Physical Exam: General: Well developed, well nourished, no acute distress Head: Normocephalic and  atraumatic Eyes:  sclerae anicteric, EOMI Ears: Normal auditory acuity Mouth: No deformity or lesions Neck: Supple, no masses or thyromegaly Lungs: Clear throughout to auscultation Heart: Regular rate and rhythm; no murmurs, rubs or bruits Abdomen: Soft, non tender and non distended. No masses, hepatosplenomegaly or hernias noted. Normal Bowel sounds Rectal: mildly decreased sphincter tone with reduced anal squeeze, no lesions, heme neg brown stool  Musculoskeletal: Symmetrical with no gross deformities  Skin: No lesions on visible extremities Pulses:  Normal pulses noted Extremities: No clubbing, cyanosis, edema or deformities noted Neurological: Alert oriented x 4, grossly nonfocal Cervical Nodes:  No significant cervical adenopathy Inguinal Nodes: No significant inguinal adenopathy Psychological:  Alert and cooperative. Normal mood and affect  Assessment and Recommendations:  1. Intermittent diarrhea, with fecal incontinence, decreased sphincter tone and reduced anal squeeze. Suspected functional diarrhea or food intolerance. Obtain a GI pathogen panel. Kegel exercises. Begin Questran 1 packet daily. Will increase to 2 packets daily if intermittent diarrhea persists. Consider colonoscopy and anorectal manometry if symptoms persist. REV in 6-8 weeks.   cc: Josetta Huddle, MD [address] [place], [place]

## 2016-08-15 DIAGNOSIS — M545 Low back pain: Secondary | ICD-10-CM | POA: Diagnosis not present

## 2016-08-15 DIAGNOSIS — M9902 Segmental and somatic dysfunction of thoracic region: Secondary | ICD-10-CM | POA: Diagnosis not present

## 2016-08-15 DIAGNOSIS — M9903 Segmental and somatic dysfunction of lumbar region: Secondary | ICD-10-CM | POA: Diagnosis not present

## 2016-08-15 DIAGNOSIS — M62830 Muscle spasm of back: Secondary | ICD-10-CM | POA: Diagnosis not present

## 2016-08-15 DIAGNOSIS — M546 Pain in thoracic spine: Secondary | ICD-10-CM | POA: Diagnosis not present

## 2016-08-15 DIAGNOSIS — M9904 Segmental and somatic dysfunction of sacral region: Secondary | ICD-10-CM | POA: Diagnosis not present

## 2016-08-16 DIAGNOSIS — E1165 Type 2 diabetes mellitus with hyperglycemia: Secondary | ICD-10-CM | POA: Diagnosis not present

## 2016-08-19 DIAGNOSIS — M9902 Segmental and somatic dysfunction of thoracic region: Secondary | ICD-10-CM | POA: Diagnosis not present

## 2016-08-19 DIAGNOSIS — M9903 Segmental and somatic dysfunction of lumbar region: Secondary | ICD-10-CM | POA: Diagnosis not present

## 2016-08-19 DIAGNOSIS — M62830 Muscle spasm of back: Secondary | ICD-10-CM | POA: Diagnosis not present

## 2016-08-19 DIAGNOSIS — M546 Pain in thoracic spine: Secondary | ICD-10-CM | POA: Diagnosis not present

## 2016-08-19 DIAGNOSIS — M9904 Segmental and somatic dysfunction of sacral region: Secondary | ICD-10-CM | POA: Diagnosis not present

## 2016-08-19 DIAGNOSIS — M545 Low back pain: Secondary | ICD-10-CM | POA: Diagnosis not present

## 2016-08-20 ENCOUNTER — Other Ambulatory Visit: Payer: Medicare Other

## 2016-08-20 DIAGNOSIS — R197 Diarrhea, unspecified: Secondary | ICD-10-CM | POA: Diagnosis not present

## 2016-08-20 DIAGNOSIS — R152 Fecal urgency: Secondary | ICD-10-CM

## 2016-08-20 DIAGNOSIS — R159 Full incontinence of feces: Secondary | ICD-10-CM | POA: Diagnosis not present

## 2016-08-21 DIAGNOSIS — M9904 Segmental and somatic dysfunction of sacral region: Secondary | ICD-10-CM | POA: Diagnosis not present

## 2016-08-21 DIAGNOSIS — M9902 Segmental and somatic dysfunction of thoracic region: Secondary | ICD-10-CM | POA: Diagnosis not present

## 2016-08-21 DIAGNOSIS — M546 Pain in thoracic spine: Secondary | ICD-10-CM | POA: Diagnosis not present

## 2016-08-21 DIAGNOSIS — M545 Low back pain: Secondary | ICD-10-CM | POA: Diagnosis not present

## 2016-08-21 DIAGNOSIS — M9903 Segmental and somatic dysfunction of lumbar region: Secondary | ICD-10-CM | POA: Diagnosis not present

## 2016-08-21 DIAGNOSIS — M62830 Muscle spasm of back: Secondary | ICD-10-CM | POA: Diagnosis not present

## 2016-08-22 DIAGNOSIS — M9903 Segmental and somatic dysfunction of lumbar region: Secondary | ICD-10-CM | POA: Diagnosis not present

## 2016-08-22 DIAGNOSIS — M546 Pain in thoracic spine: Secondary | ICD-10-CM | POA: Diagnosis not present

## 2016-08-22 DIAGNOSIS — M62830 Muscle spasm of back: Secondary | ICD-10-CM | POA: Diagnosis not present

## 2016-08-22 DIAGNOSIS — M9902 Segmental and somatic dysfunction of thoracic region: Secondary | ICD-10-CM | POA: Diagnosis not present

## 2016-08-22 DIAGNOSIS — M9904 Segmental and somatic dysfunction of sacral region: Secondary | ICD-10-CM | POA: Diagnosis not present

## 2016-08-22 DIAGNOSIS — M545 Low back pain: Secondary | ICD-10-CM | POA: Diagnosis not present

## 2016-08-22 LAB — GASTROINTESTINAL PATHOGEN PANEL PCR
C. difficile Tox A/B, PCR: NOT DETECTED
Campylobacter, PCR: NOT DETECTED
Cryptosporidium, PCR: NOT DETECTED
E COLI (ETEC) LT/ST, PCR: NOT DETECTED
E COLI (STEC) STX1/STX2, PCR: NOT DETECTED
E COLI 0157, PCR: NOT DETECTED
Giardia lamblia, PCR: NOT DETECTED
NOROVIRUS, PCR: NOT DETECTED
Rotavirus A, PCR: NOT DETECTED
SALMONELLA, PCR: NOT DETECTED
SHIGELLA, PCR: NOT DETECTED

## 2016-08-27 DIAGNOSIS — M545 Low back pain: Secondary | ICD-10-CM | POA: Diagnosis not present

## 2016-08-27 DIAGNOSIS — M9903 Segmental and somatic dysfunction of lumbar region: Secondary | ICD-10-CM | POA: Diagnosis not present

## 2016-08-27 DIAGNOSIS — M62830 Muscle spasm of back: Secondary | ICD-10-CM | POA: Diagnosis not present

## 2016-08-27 DIAGNOSIS — M546 Pain in thoracic spine: Secondary | ICD-10-CM | POA: Diagnosis not present

## 2016-08-27 DIAGNOSIS — M9902 Segmental and somatic dysfunction of thoracic region: Secondary | ICD-10-CM | POA: Diagnosis not present

## 2016-08-27 DIAGNOSIS — M9904 Segmental and somatic dysfunction of sacral region: Secondary | ICD-10-CM | POA: Diagnosis not present

## 2016-08-29 DIAGNOSIS — M9903 Segmental and somatic dysfunction of lumbar region: Secondary | ICD-10-CM | POA: Diagnosis not present

## 2016-08-29 DIAGNOSIS — M546 Pain in thoracic spine: Secondary | ICD-10-CM | POA: Diagnosis not present

## 2016-08-29 DIAGNOSIS — M9902 Segmental and somatic dysfunction of thoracic region: Secondary | ICD-10-CM | POA: Diagnosis not present

## 2016-08-29 DIAGNOSIS — M9904 Segmental and somatic dysfunction of sacral region: Secondary | ICD-10-CM | POA: Diagnosis not present

## 2016-08-29 DIAGNOSIS — M62830 Muscle spasm of back: Secondary | ICD-10-CM | POA: Diagnosis not present

## 2016-08-29 DIAGNOSIS — M545 Low back pain: Secondary | ICD-10-CM | POA: Diagnosis not present

## 2016-09-02 DIAGNOSIS — M9903 Segmental and somatic dysfunction of lumbar region: Secondary | ICD-10-CM | POA: Diagnosis not present

## 2016-09-02 DIAGNOSIS — M546 Pain in thoracic spine: Secondary | ICD-10-CM | POA: Diagnosis not present

## 2016-09-02 DIAGNOSIS — M9902 Segmental and somatic dysfunction of thoracic region: Secondary | ICD-10-CM | POA: Diagnosis not present

## 2016-09-02 DIAGNOSIS — M545 Low back pain: Secondary | ICD-10-CM | POA: Diagnosis not present

## 2016-09-02 DIAGNOSIS — M62830 Muscle spasm of back: Secondary | ICD-10-CM | POA: Diagnosis not present

## 2016-09-02 DIAGNOSIS — M9904 Segmental and somatic dysfunction of sacral region: Secondary | ICD-10-CM | POA: Diagnosis not present

## 2016-09-03 DIAGNOSIS — G914 Hydrocephalus in diseases classified elsewhere: Secondary | ICD-10-CM | POA: Diagnosis not present

## 2016-09-03 DIAGNOSIS — R270 Ataxia, unspecified: Secondary | ICD-10-CM | POA: Diagnosis not present

## 2016-09-03 DIAGNOSIS — E1340 Other specified diabetes mellitus with diabetic neuropathy, unspecified: Secondary | ICD-10-CM | POA: Diagnosis not present

## 2016-09-03 DIAGNOSIS — I4891 Unspecified atrial fibrillation: Secondary | ICD-10-CM | POA: Diagnosis not present

## 2016-09-03 DIAGNOSIS — E1165 Type 2 diabetes mellitus with hyperglycemia: Secondary | ICD-10-CM | POA: Diagnosis not present

## 2016-09-03 DIAGNOSIS — R159 Full incontinence of feces: Secondary | ICD-10-CM | POA: Diagnosis not present

## 2016-09-18 ENCOUNTER — Ambulatory Visit: Payer: Self-pay | Admitting: Gastroenterology

## 2016-09-23 DIAGNOSIS — G913 Post-traumatic hydrocephalus, unspecified: Secondary | ICD-10-CM | POA: Diagnosis not present

## 2016-09-23 DIAGNOSIS — R030 Elevated blood-pressure reading, without diagnosis of hypertension: Secondary | ICD-10-CM | POA: Diagnosis not present

## 2016-09-23 DIAGNOSIS — R270 Ataxia, unspecified: Secondary | ICD-10-CM | POA: Diagnosis not present

## 2016-09-26 ENCOUNTER — Other Ambulatory Visit: Payer: Self-pay | Admitting: Neurosurgery

## 2016-09-26 DIAGNOSIS — G913 Post-traumatic hydrocephalus, unspecified: Secondary | ICD-10-CM

## 2016-10-04 DIAGNOSIS — R159 Full incontinence of feces: Secondary | ICD-10-CM | POA: Diagnosis not present

## 2016-10-04 DIAGNOSIS — R270 Ataxia, unspecified: Secondary | ICD-10-CM | POA: Diagnosis not present

## 2016-10-04 DIAGNOSIS — E1340 Other specified diabetes mellitus with diabetic neuropathy, unspecified: Secondary | ICD-10-CM | POA: Diagnosis not present

## 2016-10-04 DIAGNOSIS — B351 Tinea unguium: Secondary | ICD-10-CM | POA: Diagnosis not present

## 2016-10-04 DIAGNOSIS — G914 Hydrocephalus in diseases classified elsewhere: Secondary | ICD-10-CM | POA: Diagnosis not present

## 2016-10-04 DIAGNOSIS — I4891 Unspecified atrial fibrillation: Secondary | ICD-10-CM | POA: Diagnosis not present

## 2016-10-04 DIAGNOSIS — E1165 Type 2 diabetes mellitus with hyperglycemia: Secondary | ICD-10-CM | POA: Diagnosis not present

## 2016-10-04 DIAGNOSIS — L282 Other prurigo: Secondary | ICD-10-CM | POA: Diagnosis not present

## 2016-10-04 DIAGNOSIS — Z1389 Encounter for screening for other disorder: Secondary | ICD-10-CM | POA: Diagnosis not present

## 2016-10-11 ENCOUNTER — Ambulatory Visit
Admission: RE | Admit: 2016-10-11 | Discharge: 2016-10-11 | Disposition: A | Discharge status: Home / Self Care | Payer: Medicare Other | Source: Ambulatory Visit | Attending: Neurosurgery | Admitting: Neurosurgery

## 2016-10-11 DIAGNOSIS — G913 Post-traumatic hydrocephalus, unspecified: Secondary | ICD-10-CM | POA: Diagnosis not present

## 2016-12-03 DIAGNOSIS — E1165 Type 2 diabetes mellitus with hyperglycemia: Secondary | ICD-10-CM | POA: Diagnosis not present

## 2016-12-03 DIAGNOSIS — E1340 Other specified diabetes mellitus with diabetic neuropathy, unspecified: Secondary | ICD-10-CM | POA: Diagnosis not present

## 2016-12-03 DIAGNOSIS — I4891 Unspecified atrial fibrillation: Secondary | ICD-10-CM | POA: Diagnosis not present

## 2016-12-03 DIAGNOSIS — B351 Tinea unguium: Secondary | ICD-10-CM | POA: Diagnosis not present

## 2016-12-03 DIAGNOSIS — R270 Ataxia, unspecified: Secondary | ICD-10-CM | POA: Diagnosis not present

## 2016-12-03 DIAGNOSIS — G914 Hydrocephalus in diseases classified elsewhere: Secondary | ICD-10-CM | POA: Diagnosis not present

## 2016-12-03 DIAGNOSIS — L282 Other prurigo: Secondary | ICD-10-CM | POA: Diagnosis not present

## 2016-12-03 DIAGNOSIS — R159 Full incontinence of feces: Secondary | ICD-10-CM | POA: Diagnosis not present

## 2016-12-05 ENCOUNTER — Emergency Department (HOSPITAL_COMMUNITY): Payer: Medicare Other

## 2016-12-05 ENCOUNTER — Emergency Department (HOSPITAL_COMMUNITY)
Admission: EM | Admit: 2016-12-05 | Discharge: 2016-12-05 | Disposition: A | Discharge status: Home / Self Care | Payer: Medicare Other | Attending: Emergency Medicine | Admitting: Emergency Medicine

## 2016-12-05 ENCOUNTER — Encounter (HOSPITAL_COMMUNITY): Payer: Self-pay

## 2016-12-05 DIAGNOSIS — W228XXA Striking against or struck by other objects, initial encounter: Secondary | ICD-10-CM | POA: Diagnosis not present

## 2016-12-05 DIAGNOSIS — S299XXA Unspecified injury of thorax, initial encounter: Secondary | ICD-10-CM | POA: Diagnosis not present

## 2016-12-05 DIAGNOSIS — W19XXXA Unspecified fall, initial encounter: Secondary | ICD-10-CM

## 2016-12-05 DIAGNOSIS — Y999 Unspecified external cause status: Secondary | ICD-10-CM | POA: Insufficient documentation

## 2016-12-05 DIAGNOSIS — Z87891 Personal history of nicotine dependence: Secondary | ICD-10-CM | POA: Diagnosis not present

## 2016-12-05 DIAGNOSIS — S51012A Laceration without foreign body of left elbow, initial encounter: Secondary | ICD-10-CM

## 2016-12-05 DIAGNOSIS — Y939 Activity, unspecified: Secondary | ICD-10-CM | POA: Insufficient documentation

## 2016-12-05 DIAGNOSIS — E119 Type 2 diabetes mellitus without complications: Secondary | ICD-10-CM | POA: Insufficient documentation

## 2016-12-05 DIAGNOSIS — I10 Essential (primary) hypertension: Secondary | ICD-10-CM | POA: Insufficient documentation

## 2016-12-05 DIAGNOSIS — Z79899 Other long term (current) drug therapy: Secondary | ICD-10-CM | POA: Insufficient documentation

## 2016-12-05 DIAGNOSIS — S41102A Unspecified open wound of left upper arm, initial encounter: Secondary | ICD-10-CM | POA: Insufficient documentation

## 2016-12-05 DIAGNOSIS — Y929 Unspecified place or not applicable: Secondary | ICD-10-CM | POA: Insufficient documentation

## 2016-12-05 DIAGNOSIS — R51 Headache: Secondary | ICD-10-CM | POA: Diagnosis not present

## 2016-12-05 DIAGNOSIS — R079 Chest pain, unspecified: Secondary | ICD-10-CM | POA: Diagnosis not present

## 2016-12-05 DIAGNOSIS — S3991XA Unspecified injury of abdomen, initial encounter: Secondary | ICD-10-CM | POA: Diagnosis not present

## 2016-12-05 DIAGNOSIS — S0003XA Contusion of scalp, initial encounter: Secondary | ICD-10-CM

## 2016-12-05 DIAGNOSIS — S0181XA Laceration without foreign body of other part of head, initial encounter: Secondary | ICD-10-CM | POA: Diagnosis not present

## 2016-12-05 DIAGNOSIS — R109 Unspecified abdominal pain: Secondary | ICD-10-CM | POA: Diagnosis not present

## 2016-12-05 DIAGNOSIS — Z7982 Long term (current) use of aspirin: Secondary | ICD-10-CM | POA: Insufficient documentation

## 2016-12-05 DIAGNOSIS — S0990XA Unspecified injury of head, initial encounter: Secondary | ICD-10-CM | POA: Diagnosis not present

## 2016-12-05 NOTE — ED Notes (Signed)
ED Provider at bedside. 

## 2016-12-05 NOTE — ED Provider Notes (Signed)
[facility] DEPT Provider Note   CSN: [id number] Arrival date & time: 12/05/16  1801     History   Chief Complaint Chief Complaint  Patient presents with  . Fall    HPI Craig Whitehead is a 83 y.o. male.  HPI   83 year old male presents with concern for fall. Patient reports that he's had difficulty ambulating for 19 years, however worsening in his gait over the last 4 months. Reports that his digits secondary to his VP shunt. He seen his neurosurgeon, Dr. Vertell Limber, and had a CT head done about one month ago for the same. Reports that his difficulty ambulating and shuffling gait has increased. Last week his primary care physician recommended he use to cane, however he had not started using a cane yet. Today he was shuffling and lost balance, falling and hitting his head. He had no loss of consciousness. Had a laceration to scalp. Denies headache, neck pain, chest pain, abdominal pain, nausea, vomiting, numbness, weakness or extremity pain.  Past Medical History:  Diagnosis Date  . Anxiety   . Atrial fibrillation ([place])   . Bladder cancer ([place])   . BPH (benign prostatic hypertrophy)   . Diabetes mellitus ([place])   . Diarrhea   . Difficult intubation   . ED (erectile dysfunction)   . GERD (gastroesophageal reflux disease)   . Hypercholesteremia   . Hypertension   . NPH (normal pressure hydrocephalus)   . Seborrheic dermatitis   . Vitamin D deficiency     Patient Active Problem List   Diagnosis Date Noted  . Persistent atrial fibrillation ([place]): CHA2DS2Vasc 2 (age). Rate controlled without medications asymptomatic 06/02/2016  . Medication management 05/31/2016  . Major depressive disorder, recurrent severe without psychotic features ([place]) 05/13/2016    Past Surgical History:  Procedure Laterality Date  . BLADDER SURGERY     x 4 for bladder cancer  . BRAIN SURGERY    . CATARACT EXTRACTION Bilateral   . INGUINAL HERNIA REPAIR Bilateral   . TRANSTHORACIC ECHOCARDIOGRAM   05/2016   [facility]: EF 45-50%. Mildly reduced function (likely related to A. fib). Marketed LA dilation. Moderate RA dilation. Aortic sclerosis without stenosis. Mild-moderate TR. Mild-moderate pulmonary tension. Small pericardial effusion versus pericardial fat  . VENTRICULOPERITONEAL SHUNT  2002       Home Medications    Prior to Admission medications   Medication Sig Start Date End Date Taking? Authorizing Provider  aspirin EC 81 MG tablet Take 1 tablet (81 mg total) by mouth daily. 06/27/16   Leonie Man, MD  cholecalciferol (VITAMIN D) 1000 UNITS tablet Take 4,000 Units by mouth daily.    [provider]  cholestyramine (QUESTRAN) 4 g packet Take 1 packet (4 g total) by mouth daily. 08/06/16   Ladene Artist, MD  mirabegron ER (MYRBETRIQ) 50 MG TB24 tablet Take 50 mg by mouth daily.    [provider]  solifenacin (VESICARE) 10 MG tablet Take 10 mg by mouth daily.    [provider]    Family History Family History  Problem Relation Age of Onset  . Rheum arthritis Mother   . Cancer Father     Social History Social History  Substance Use Topics  . Smoking status: Former Smoker    Quit date: 07/06/1994  . Smokeless tobacco: Never Used  . Alcohol use 0.6 oz/week    1 Shots of liquor per week     Comment: 1 bourbon daily     Allergies  Patient has no known allergies.   Review of Systems Review of Systems  Constitutional: Negative for fever.  HENT: Negative for sore throat.   Eyes: Negative for visual disturbance.  Respiratory: Negative for cough and shortness of breath.   Cardiovascular: Negative for chest pain.  Gastrointestinal: Negative for abdominal pain, diarrhea, nausea and vomiting.  Genitourinary: Negative for difficulty urinating and dysuria.  Musculoskeletal: Positive for gait problem. Negative for back pain and neck stiffness.  Skin: Negative for rash.  Neurological: Negative for dizziness, syncope,  weakness, numbness and headaches.     Physical Exam Updated Vital Signs BP (!) 180/101 (BP Location: Right Arm)   Pulse (!) 57   Temp 98 F (36.7 C) (Oral)   Resp 16   Ht 5\' 7"  (1.702 m)   Wt 145 lb (65.8 kg)   SpO2 98%   BMI 22.71 kg/m   Physical Exam  Constitutional: He is oriented to person, place, and time. He appears well-developed and well-nourished. No distress.  HENT:  Head: Normocephalic.  Hematoma superior to left orbit, shallow laceration  Eyes: Conjunctivae and EOM are normal.  Neck: Normal range of motion.  Cardiovascular: Normal rate, regular rhythm, normal heart sounds and intact distal pulses.  Exam reveals no gallop and no friction rub.   No murmur heard. Pulmonary/Chest: Effort normal and breath sounds normal. No respiratory distress. He has no wheezes. He has no rales. He exhibits no tenderness.  Abdominal: Soft. He exhibits no distension. There is no tenderness. There is no guarding.  Musculoskeletal: He exhibits no edema.       Cervical back: He exhibits no tenderness and no bony tenderness.  Neurological: He is alert and oriented to person, place, and time. He has normal strength. No sensory deficit.  Skin: Skin is warm and dry. He is not diaphoretic.  Nursing note and vitals reviewed.    ED Treatments / Results  Labs (all labs ordered are listed, but only abnormal results are displayed) Labs Reviewed - No data to display  EKG  EKG Interpretation None       Radiology Dg Skull 1-3 Views  Result Date: 12/05/2016 CLINICAL DATA:  Fall, evaluate VP shunt EXAM: SKULL - 1-3 VIEW COMPARISON:  CT 10/11/2016 FINDINGS: Right-sided VP shunt with tip coursing inferiorly toward the midline. Imaged portions of the shunt catheter appear continuous. Calvarium grossly unremarkable. IMPRESSION: Grossly intact appearing right-sided VP shunt. Electronically Signed   By: Donavan Foil M.D.   On: 12/05/2016 19:22   Dg Chest 1 View  Result Date:  12/05/2016 CLINICAL DATA:  Fall, evaluate shunt EXAM: CHEST 1 VIEW COMPARISON:  05/14/2016 FINDINGS: Cardiomegaly with central vascular congestion. No pleural effusion. Right-sided VP shunt catheter appears intact. Probable prominent skin fold over the right upper thorax and right lung base. Aortic atherosclerosis. IMPRESSION: 1. Right sided VP catheter appears intact over the chest 2. Probable prominent skin folds over the right lung apex and right lung base 3. Cardiomegaly with central vascular congestion Electronically Signed   By: Donavan Foil M.D.   On: 12/05/2016 19:23   Dg Abdomen 1 View  Result Date: 12/05/2016 CLINICAL DATA:  Fall, evaluate shunt EXAM: ABDOMEN - 1 VIEW COMPARISON:  06/01/2014 FINDINGS: Nonobstructed gas pattern with moderate feces in the colon. Calcified pelvic phleboliths. Right-sided intraperitoneal shunt courses toward the pelvis, the tip is projecting over the medial left iliac crest. Catheter tubing appears intact. IMPRESSION: 1. Nonobstructed gas pattern 2. Right sided shunt catheter tubing appears intact over the abdomen. Electronically  Signed   By: Donavan Foil M.D.   On: 12/05/2016 19:25   Ct Head Wo Contrast  Result Date: 12/05/2016 CLINICAL DATA:  Pain after trauma EXAM: CT HEAD WITHOUT CONTRAST TECHNIQUE: Contiguous axial images were obtained from the base of the skull through the vertex without intravenous contrast. COMPARISON:  October 11, 2016 FINDINGS: Brain: No subdural, epidural, or subarachnoid hemorrhage. A VP shunt enters via a a right frontal approach, terminating just to the right of midline near the top of the third ventricle. The shunt is in stable position. The ventricles are stable in caliber. The sulci are unchanged. No midline shift. White matter changes are stable, particularly adjacent to the frontal horn of the right lateral ventricle. No acute cortical ischemia or infarct. Cerebellum, brainstem, and basal cisterns stable. Vascular: Calcified  atherosclerosis is seen in the intracranial carotid arteries. Skull: Normal. Negative for fracture or focal lesion. Sinuses/Orbits: No acute finding. Other: There is soft tissue swelling over the left forehead. There is a right-sided VP shunt which is stable. No other extracranial soft tissue abnormalities identified. IMPRESSION: No interval change or acute intracranial abnormality. Stable VP shunt. Electronically Signed   By: Dorise Bullion III M.D   On: 12/05/2016 19:36    Procedures Procedures (including critical care time)  Medications Ordered in ED Medications - No data to display   Initial Impression / Assessment and Plan / ED Course  I have reviewed the triage vital signs and the nursing notes.  Pertinent labs & imaging results that were available during my care of the patient were reviewed by me and considered in my medical decision making (see chart for details).    83 year old male with a history of atrial fibrillation on aspirin, bladder cancer, diabetes, hypertension, hyperlipidemia, normal pressure hydrocephalus with VP shunt in place who presents with fall in the setting of progressive shuffling gait which he attributes to his VP shunt.  Patient neurologically intact, no midline neck tenderness, feel he is reliable historian, and have low suspicion for cervical spine fracture. CT head was done given signs of head trauma and to evaluate patient's shunt and shows no sign of bleed, no acute changes.  X-ray of his shunt was done which shows shunt intact. Laceration on head is not amenable to sutures, shallow and will heal by secondary intention. Recommend patient ambulate with cane, follow up closely with PCP and NSU. Patient discharged in stable condition with understanding of reasons to return.    Final Clinical Impressions(s) / ED Diagnoses   Final diagnoses:  Fall  Contusion of scalp, initial encounter  Laceration of skin of forehead, initial encounter  Skin tear of left elbow  without complication, initial encounter    New Prescriptions Discharge Medication List as of 12/05/2016  8:20 PM       Gareth Morgan, MD 12/06/16 1113

## 2016-12-05 NOTE — ED Notes (Signed)
Pt in NAD. Reviewed D/C instructions with pt prior to escorting to front waiting area to wait for a cab. Unable to obtain signature for D/C d/t no available WOW or other device with signature pad.

## 2016-12-05 NOTE — ED Triage Notes (Signed)
Pt via [facility] s/p fall that occurred today. Pt with hx of hydrocephaly and recently reports issue with shunt due to new onset shuffling of feet while ambulating. Per EMS, pt was shuffling when he fell face forward. Denies LOC, head/neck/back pain. Per EMS, passed C-spine clearance but soft collar was placed. Two lacerations and hematoma noted to L eyebrow and skin tear to L elbow. 157/95, HR 84, 98% RA, CBG 229.

## 2016-12-30 DIAGNOSIS — R270 Ataxia, unspecified: Secondary | ICD-10-CM | POA: Diagnosis not present

## 2016-12-30 DIAGNOSIS — G913 Post-traumatic hydrocephalus, unspecified: Secondary | ICD-10-CM | POA: Diagnosis not present

## 2016-12-30 DIAGNOSIS — R3981 Functional urinary incontinence: Secondary | ICD-10-CM | POA: Diagnosis not present

## 2016-12-30 DIAGNOSIS — R030 Elevated blood-pressure reading, without diagnosis of hypertension: Secondary | ICD-10-CM | POA: Diagnosis not present

## 2017-01-17 DIAGNOSIS — E1165 Type 2 diabetes mellitus with hyperglycemia: Secondary | ICD-10-CM | POA: Diagnosis not present

## 2017-01-17 DIAGNOSIS — R159 Full incontinence of feces: Secondary | ICD-10-CM | POA: Diagnosis not present

## 2017-01-17 DIAGNOSIS — E1340 Other specified diabetes mellitus with diabetic neuropathy, unspecified: Secondary | ICD-10-CM | POA: Diagnosis not present

## 2017-01-17 DIAGNOSIS — L282 Other prurigo: Secondary | ICD-10-CM | POA: Diagnosis not present

## 2017-01-17 DIAGNOSIS — I4891 Unspecified atrial fibrillation: Secondary | ICD-10-CM | POA: Diagnosis not present

## 2017-01-17 DIAGNOSIS — B351 Tinea unguium: Secondary | ICD-10-CM | POA: Diagnosis not present

## 2017-01-17 DIAGNOSIS — R270 Ataxia, unspecified: Secondary | ICD-10-CM | POA: Diagnosis not present

## 2017-01-17 DIAGNOSIS — G914 Hydrocephalus in diseases classified elsewhere: Secondary | ICD-10-CM | POA: Diagnosis not present

## 2017-03-28 ENCOUNTER — Inpatient Hospital Stay (HOSPITAL_COMMUNITY): Payer: Medicare Other

## 2017-03-28 ENCOUNTER — Inpatient Hospital Stay (HOSPITAL_COMMUNITY)
Admission: EM | Admit: 2017-03-28 | Discharge: 2017-04-01 | DRG: 637 | Disposition: A | Discharge status: Skilled Nursing Facility | Payer: Medicare Other | Attending: Internal Medicine | Admitting: Internal Medicine

## 2017-03-28 ENCOUNTER — Encounter (HOSPITAL_COMMUNITY): Payer: Self-pay

## 2017-03-28 DIAGNOSIS — I482 Chronic atrial fibrillation, unspecified: Secondary | ICD-10-CM

## 2017-03-28 DIAGNOSIS — E1365 Other specified diabetes mellitus with hyperglycemia: Secondary | ICD-10-CM | POA: Diagnosis not present

## 2017-03-28 DIAGNOSIS — Z7982 Long term (current) use of aspirin: Secondary | ICD-10-CM

## 2017-03-28 DIAGNOSIS — G912 (Idiopathic) normal pressure hydrocephalus: Secondary | ICD-10-CM | POA: Diagnosis not present

## 2017-03-28 DIAGNOSIS — N182 Chronic kidney disease, stage 2 (mild): Secondary | ICD-10-CM | POA: Diagnosis present

## 2017-03-28 DIAGNOSIS — I481 Persistent atrial fibrillation: Secondary | ICD-10-CM | POA: Diagnosis present

## 2017-03-28 DIAGNOSIS — Z5900 Homelessness unspecified: Secondary | ICD-10-CM

## 2017-03-28 DIAGNOSIS — L309 Dermatitis, unspecified: Secondary | ICD-10-CM | POA: Diagnosis present

## 2017-03-28 DIAGNOSIS — R531 Weakness: Secondary | ICD-10-CM

## 2017-03-28 DIAGNOSIS — Z8551 Personal history of malignant neoplasm of bladder: Secondary | ICD-10-CM | POA: Diagnosis not present

## 2017-03-28 DIAGNOSIS — Z79899 Other long term (current) drug therapy: Secondary | ICD-10-CM

## 2017-03-28 DIAGNOSIS — N400 Enlarged prostate without lower urinary tract symptoms: Secondary | ICD-10-CM | POA: Diagnosis present

## 2017-03-28 DIAGNOSIS — Z982 Presence of cerebrospinal fluid drainage device: Secondary | ICD-10-CM | POA: Diagnosis not present

## 2017-03-28 DIAGNOSIS — R159 Full incontinence of feces: Secondary | ICD-10-CM | POA: Diagnosis not present

## 2017-03-28 DIAGNOSIS — Z87891 Personal history of nicotine dependence: Secondary | ICD-10-CM

## 2017-03-28 DIAGNOSIS — N179 Acute kidney failure, unspecified: Secondary | ICD-10-CM | POA: Diagnosis present

## 2017-03-28 DIAGNOSIS — B351 Tinea unguium: Secondary | ICD-10-CM | POA: Diagnosis not present

## 2017-03-28 DIAGNOSIS — Z809 Family history of malignant neoplasm, unspecified: Secondary | ICD-10-CM | POA: Diagnosis not present

## 2017-03-28 DIAGNOSIS — N189 Chronic kidney disease, unspecified: Secondary | ICD-10-CM | POA: Diagnosis not present

## 2017-03-28 DIAGNOSIS — E1122 Type 2 diabetes mellitus with diabetic chronic kidney disease: Secondary | ICD-10-CM | POA: Diagnosis present

## 2017-03-28 DIAGNOSIS — D72829 Elevated white blood cell count, unspecified: Secondary | ICD-10-CM | POA: Diagnosis present

## 2017-03-28 DIAGNOSIS — I5022 Chronic systolic (congestive) heart failure: Secondary | ICD-10-CM | POA: Diagnosis present

## 2017-03-28 DIAGNOSIS — R7989 Other specified abnormal findings of blood chemistry: Secondary | ICD-10-CM | POA: Diagnosis present

## 2017-03-28 DIAGNOSIS — R251 Tremor, unspecified: Secondary | ICD-10-CM | POA: Diagnosis not present

## 2017-03-28 DIAGNOSIS — K219 Gastro-esophageal reflux disease without esophagitis: Secondary | ICD-10-CM | POA: Diagnosis present

## 2017-03-28 DIAGNOSIS — Z7952 Long term (current) use of systemic steroids: Secondary | ICD-10-CM | POA: Diagnosis not present

## 2017-03-28 DIAGNOSIS — Z8261 Family history of arthritis: Secondary | ICD-10-CM | POA: Diagnosis not present

## 2017-03-28 DIAGNOSIS — R270 Ataxia, unspecified: Secondary | ICD-10-CM | POA: Diagnosis present

## 2017-03-28 DIAGNOSIS — L282 Other prurigo: Secondary | ICD-10-CM | POA: Diagnosis not present

## 2017-03-28 DIAGNOSIS — Z6820 Body mass index (BMI) 20.0-20.9, adult: Secondary | ICD-10-CM

## 2017-03-28 DIAGNOSIS — E43 Unspecified severe protein-calorie malnutrition: Secondary | ICD-10-CM | POA: Diagnosis present

## 2017-03-28 DIAGNOSIS — I130 Hypertensive heart and chronic kidney disease with heart failure and stage 1 through stage 4 chronic kidney disease, or unspecified chronic kidney disease: Secondary | ICD-10-CM | POA: Diagnosis present

## 2017-03-28 DIAGNOSIS — Z66 Do not resuscitate: Secondary | ICD-10-CM | POA: Diagnosis present

## 2017-03-28 DIAGNOSIS — M6281 Muscle weakness (generalized): Secondary | ICD-10-CM | POA: Diagnosis not present

## 2017-03-28 DIAGNOSIS — Z9181 History of falling: Secondary | ICD-10-CM | POA: Diagnosis not present

## 2017-03-28 DIAGNOSIS — I4819 Other persistent atrial fibrillation: Secondary | ICD-10-CM

## 2017-03-28 DIAGNOSIS — E1165 Type 2 diabetes mellitus with hyperglycemia: Secondary | ICD-10-CM | POA: Diagnosis present

## 2017-03-28 DIAGNOSIS — I4821 Permanent atrial fibrillation: Secondary | ICD-10-CM | POA: Diagnosis present

## 2017-03-28 DIAGNOSIS — R5381 Other malaise: Secondary | ICD-10-CM | POA: Diagnosis not present

## 2017-03-28 DIAGNOSIS — L8990 Pressure ulcer of unspecified site, unspecified stage: Secondary | ICD-10-CM | POA: Diagnosis present

## 2017-03-28 DIAGNOSIS — R488 Other symbolic dysfunctions: Secondary | ICD-10-CM | POA: Diagnosis not present

## 2017-03-28 DIAGNOSIS — R260 Ataxic gait: Secondary | ICD-10-CM | POA: Diagnosis not present

## 2017-03-28 DIAGNOSIS — E1340 Other specified diabetes mellitus with diabetic neuropathy, unspecified: Secondary | ICD-10-CM | POA: Diagnosis not present

## 2017-03-28 DIAGNOSIS — G914 Hydrocephalus in diseases classified elsewhere: Secondary | ICD-10-CM | POA: Diagnosis not present

## 2017-03-28 DIAGNOSIS — I4891 Unspecified atrial fibrillation: Secondary | ICD-10-CM | POA: Diagnosis not present

## 2017-03-28 DIAGNOSIS — I6789 Other cerebrovascular disease: Secondary | ICD-10-CM | POA: Diagnosis not present

## 2017-03-28 DIAGNOSIS — R2689 Other abnormalities of gait and mobility: Secondary | ICD-10-CM | POA: Diagnosis not present

## 2017-03-28 LAB — COMPREHENSIVE METABOLIC PANEL
ALBUMIN: 3.5 g/dL (ref 3.5–5.0)
ALK PHOS: 62 U/L (ref 38–126)
ALT: 28 U/L (ref 17–63)
ANION GAP: 11 (ref 5–15)
AST: 17 U/L (ref 15–41)
BUN: 64 mg/dL — ABNORMAL HIGH (ref 6–20)
CALCIUM: 8.5 mg/dL — AB (ref 8.9–10.3)
CHLORIDE: 107 mmol/L (ref 101–111)
CO2: 17 mmol/L — ABNORMAL LOW (ref 22–32)
Creatinine, Ser: 1.91 mg/dL — ABNORMAL HIGH (ref 0.61–1.24)
GFR calc non Af Amer: 30 mL/min — ABNORMAL LOW (ref >60)
GFR, EST AFRICAN AMERICAN: 35 mL/min — AB (ref >60)
GLUCOSE: 455 mg/dL — AB (ref 65–99)
Potassium: 4.9 mmol/L (ref 3.5–5.1)
SODIUM: 135 mmol/L (ref 135–145)
Total Bilirubin: 0.8 mg/dL (ref 0.3–1.2)
Total Protein: 5.7 g/dL — ABNORMAL LOW (ref 6.5–8.1)

## 2017-03-28 LAB — CBC WITH DIFFERENTIAL/PLATELET
BASOS PCT: 0 %
Basophils Absolute: 0 10*3/uL (ref 0.0–0.1)
EOS ABS: 0 10*3/uL (ref 0.0–0.7)
EOS PCT: 0 %
HCT: 36.1 % — ABNORMAL LOW (ref 39.0–52.0)
HEMOGLOBIN: 12.1 g/dL — AB (ref 13.0–17.0)
Lymphocytes Relative: 9 %
Lymphs Abs: 1.2 10*3/uL (ref 0.7–4.0)
MCH: 33.2 pg (ref 26.0–34.0)
MCHC: 33.5 g/dL (ref 30.0–36.0)
MCV: 98.9 fL (ref 78.0–100.0)
MONOS PCT: 7 %
Monocytes Absolute: 1 10*3/uL (ref 0.1–1.0)
NEUTROS PCT: 84 %
Neutro Abs: 12 10*3/uL — ABNORMAL HIGH (ref 1.7–7.7)
PLATELETS: 156 10*3/uL (ref 150–400)
RBC: 3.65 MIL/uL — AB (ref 4.22–5.81)
RDW: 13.8 % (ref 11.5–15.5)
WBC: 14.3 10*3/uL — ABNORMAL HIGH (ref 4.0–10.5)

## 2017-03-28 LAB — URINALYSIS, ROUTINE W REFLEX MICROSCOPIC
BILIRUBIN URINE: NEGATIVE
Glucose, UA: >=500 mg/dL — AB
Hgb urine dipstick: NEGATIVE
KETONES UR: NEGATIVE mg/dL
NITRITE: NEGATIVE
PH: 5 (ref 5.0–8.0)
PROTEIN: NEGATIVE mg/dL
SQUAMOUS EPITHELIAL / LPF: NONE SEEN
Specific Gravity, Urine: 1.014 (ref 1.005–1.030)

## 2017-03-28 LAB — GLUCOSE, CAPILLARY: Glucose-Capillary: 390 mg/dL — ABNORMAL HIGH (ref 65–99)

## 2017-03-28 LAB — I-STAT TROPONIN, ED: TROPONIN I, POC: 0.03 ng/mL (ref 0.00–0.08)

## 2017-03-28 LAB — CBG MONITORING, ED: Glucose-Capillary: 424 mg/dL — ABNORMAL HIGH (ref 65–99)

## 2017-03-28 MED ORDER — TAMSULOSIN HCL 0.4 MG PO CAPS
0.4000 mg | ORAL_CAPSULE | Freq: Every day | ORAL | Status: DC
  Administered 2017-03-29 – 2017-04-01 (×4): 0.4 mg via ORAL
  Filled 2017-03-28 (×4): qty 1

## 2017-03-28 MED ORDER — INSULIN ASPART 100 UNIT/ML ~~LOC~~ SOLN
0.0000 [IU] | Freq: Three times a day (TID) | SUBCUTANEOUS | Status: DC
  Administered 2017-03-29: 5 [IU] via SUBCUTANEOUS
  Administered 2017-03-29: 2 [IU] via SUBCUTANEOUS
  Administered 2017-03-29 – 2017-03-30 (×2): 3 [IU] via SUBCUTANEOUS
  Administered 2017-03-30: 5 [IU] via SUBCUTANEOUS
  Administered 2017-03-30: 7 [IU] via SUBCUTANEOUS
  Administered 2017-03-31: 5 [IU] via SUBCUTANEOUS
  Administered 2017-03-31: 9 [IU] via SUBCUTANEOUS
  Administered 2017-03-31: 3 [IU] via SUBCUTANEOUS
  Administered 2017-04-01 (×2): 2 [IU] via SUBCUTANEOUS

## 2017-03-28 MED ORDER — SODIUM CHLORIDE 0.9 % IV SOLN
Freq: Once | INTRAVENOUS | Status: AC
  Administered 2017-03-28: 16:00:00 via INTRAVENOUS

## 2017-03-28 MED ORDER — SODIUM CHLORIDE 0.9 % IV SOLN
INTRAVENOUS | Status: DC
  Administered 2017-03-28 – 2017-03-29 (×2): via INTRAVENOUS

## 2017-03-28 MED ORDER — ASPIRIN EC 81 MG PO TBEC: 81.0000 mg | DELAYED_RELEASE_TABLET | Freq: Every day | ORAL | Status: DC

## 2017-03-28 MED ORDER — VITAMIN B-1 100 MG PO TABS
100.0000 mg | ORAL_TABLET | Freq: Every day | ORAL | Status: DC
  Administered 2017-03-29 – 2017-04-01 (×4): 100 mg via ORAL
  Filled 2017-03-28 (×4): qty 1

## 2017-03-28 MED ORDER — ASPIRIN EC 81 MG PO TBEC
81.0000 mg | DELAYED_RELEASE_TABLET | Freq: Every day | ORAL | Status: DC
  Administered 2017-03-29 – 2017-04-01 (×4): 81 mg via ORAL
  Filled 2017-03-28 (×4): qty 1

## 2017-03-28 MED ORDER — ENSURE ENLIVE PO LIQD: 237.0000 mL | Freq: Two times a day (BID) | ORAL | Status: DC

## 2017-03-28 MED ORDER — INSULIN ASPART 100 UNIT/ML ~~LOC~~ SOLN
10.0000 [IU] | Freq: Once | SUBCUTANEOUS | Status: AC
  Administered 2017-03-28: 10 [IU] via INTRAVENOUS
  Filled 2017-03-28: qty 1

## 2017-03-28 MED ORDER — INSULIN ASPART 100 UNIT/ML IV SOLN: 9.0000 [IU] | Freq: Once | INTRAVENOUS | Status: DC

## 2017-03-28 MED ORDER — ENOXAPARIN SODIUM 30 MG/0.3ML ~~LOC~~ SOLN
30.0000 mg | SUBCUTANEOUS | Status: DC
  Administered 2017-03-29 – 2017-04-01 (×4): 30 mg via SUBCUTANEOUS
  Filled 2017-03-28 (×4): qty 0.3

## 2017-03-28 MED ORDER — SODIUM CHLORIDE 0.9 % IV BOLUS (SEPSIS)
500.0000 mL | Freq: Once | INTRAVENOUS | Status: AC
Start: 2017-03-28 — End: 2017-03-28
  Administered 2017-03-28: 500 mL via INTRAVENOUS

## 2017-03-28 MED ORDER — FOLIC ACID 1 MG PO TABS
1.0000 mg | ORAL_TABLET | Freq: Every day | ORAL | Status: DC
  Administered 2017-03-29 – 2017-04-01 (×4): 1 mg via ORAL
  Filled 2017-03-28 (×4): qty 1

## 2017-03-28 MED ORDER — TRIAMCINOLONE 0.1 % CREAM:EUCERIN CREAM 1:1
TOPICAL_CREAM | Freq: Two times a day (BID) | CUTANEOUS | Status: DC
  Administered 2017-03-29 – 2017-03-31 (×5): via TOPICAL
  Administered 2017-04-01: 1 via TOPICAL
  Filled 2017-03-28 (×2): qty 1

## 2017-03-28 NOTE — ED Provider Notes (Signed)
[facility] DEPT Provider Note   CSN: [id number] Arrival date & time: 03/28/17  1424     History   Chief Complaint Chief Complaint  Patient presents with  . Weakness    HPI Craig Whitehead is a 83 y.o. male.  HPI Patient is sent from primary care physician's office for further evaluation. Patient reports he went for routine visit. He reports he did report some generalized weakness. He reports he's felt very hungry and fatigued, he states he has not eaten for 2 days. Patient states that he has not had any medications for several months. He reports he became homeless about 4 months ago. He lives out of his car and lives in a storage unit. He reports that he is not taking his metformin because his physician told him he didn't have to take it due to chronic diarrhea from the medication. Thus his diabetes has been untreated. He reports that his doctor was concerned about his elevated blood sugar in the office today. Patient denies chest pain, dyspnea, abdominal pain or extremity pain. Past Medical History:  Diagnosis Date  . Anxiety   . Atrial fibrillation ([place])   . Bladder cancer ([place])   . BPH (benign prostatic hypertrophy)   . Diabetes mellitus ([place])   . Diarrhea   . Difficult intubation   . ED (erectile dysfunction)   . GERD (gastroesophageal reflux disease)   . Hypercholesteremia   . Hypertension   . NPH (normal pressure hydrocephalus)   . Seborrheic dermatitis   . Vitamin D deficiency     Patient Active Problem List   Diagnosis Date Noted  . Persistent atrial fibrillation ([place]): CHA2DS2Vasc 2 (age). Rate controlled without medications asymptomatic 06/02/2016  . Medication management 05/31/2016  . Major depressive disorder, recurrent severe without psychotic features ([place]) 05/13/2016    Past Surgical History:  Procedure Laterality Date  . BLADDER SURGERY     x 4 for bladder cancer  . BRAIN SURGERY    . CATARACT EXTRACTION Bilateral   . INGUINAL HERNIA REPAIR  Bilateral   . TRANSTHORACIC ECHOCARDIOGRAM  05/2016   [facility]: EF 45-50%. Mildly reduced function (likely related to A. fib). Marketed LA dilation. Moderate RA dilation. Aortic sclerosis without stenosis. Mild-moderate TR. Mild-moderate pulmonary tension. Small pericardial effusion versus pericardial fat  . VENTRICULOPERITONEAL SHUNT  2002       Home Medications    Prior to Admission medications   Medication Sig Start Date End Date Taking? Authorizing Provider  aspirin EC 81 MG tablet Take 1 tablet (81 mg total) by mouth daily. 06/27/16  Yes Leonie Man, MD  cholecalciferol (VITAMIN D) 1000 UNITS tablet Take 4,000 Units by mouth daily.   Yes [provider]  cholestyramine (QUESTRAN) 4 g packet Take 1 packet (4 g total) by mouth daily. 08/06/16  Yes Ladene Artist, MD  mirabegron ER (MYRBETRIQ) 50 MG TB24 tablet Take 50 mg by mouth daily.   Yes [provider]  solifenacin (VESICARE) 10 MG tablet Take 10 mg by mouth daily.   Yes [provider]    Family History Family History  Problem Relation Age of Onset  . Rheum arthritis Mother   . Cancer Father     Social History Social History  Substance Use Topics  . Smoking status: Former Smoker    Quit date: 07/06/1994  . Smokeless tobacco: Never Used  . Alcohol use 0.6 oz/week    1 Shots of liquor per week  Comment: 1 bourbon daily     Allergies   Patient has no known allergies.   Review of Systems Review of Systems 10 Systems reviewed and are negative for acute change except as noted in the HPI.   Physical Exam Updated Vital Signs BP 113/84   Pulse 94   Temp 97.9 F (36.6 C) (Oral)   Resp 14   Ht 5\' 6"  (1.676 m)   Wt 57 kg (125 lb 9.6 oz)   SpO2 100%   BMI 20.27 kg/m   Physical Exam  Constitutional: He is oriented to person, place, and time. He appears well-developed and well-nourished. No distress.  HENT:  Head: Normocephalic and atraumatic.  Mucous  membranes dry, glossitis.  Eyes: Conjunctivae and EOM are normal.  Neck: Neck supple.  Cardiovascular: Normal rate.   No murmur heard. Irregularly irregular.  Pulmonary/Chest: Effort normal and breath sounds normal. No respiratory distress.  Abdominal: Soft. He exhibits no distension. There is no tenderness. There is no guarding.  Musculoskeletal: Normal range of motion. He exhibits no edema or tenderness.  Neurological: He is alert and oriented to person, place, and time. No cranial nerve deficit. He exhibits normal muscle tone. Coordination normal.  Skin: Skin is warm and dry.  Psychiatric: He has a normal mood and affect.  Nursing note and vitals reviewed.    ED Treatments / Results  Labs (all labs ordered are listed, but only abnormal results are displayed) Labs Reviewed  CBC WITH DIFFERENTIAL/PLATELET - Abnormal; Notable for the following:       Result Value   WBC 14.3 (*)    RBC 3.65 (*)    Hemoglobin 12.1 (*)    HCT 36.1 (*)    Neutro Abs 12.0 (*)    All other components within normal limits  COMPREHENSIVE METABOLIC PANEL - Abnormal; Notable for the following:    CO2 17 (*)    Glucose, Bld 455 (*)    BUN 64 (*)    Creatinine, Ser 1.91 (*)    Calcium 8.5 (*)    Total Protein 5.7 (*)    GFR calc non Af Amer 30 (*)    GFR calc Af Amer 35 (*)    All other components within normal limits  CBG MONITORING, ED - Abnormal; Notable for the following:    Glucose-Capillary 424 (*)    All other components within normal limits  URINALYSIS, ROUTINE W REFLEX MICROSCOPIC  I-STAT TROPONIN, ED    EKG  EKG Interpretation  Date/Time:  Friday March 28 2017 14:37:40 EDT Ventricular Rate:  90 PR Interval:    QRS Duration: 106 QT Interval:  377 QTC Calculation: 462 R Axis:   -45 Text Interpretation:  Atrial fibrillation LAD, consider left anterior fascicular block Anteroseptal infarct, age indeterminate agree. no sig change from old Confirmed by Charlesetta Shanks [phone]) on  03/28/2017 3:51:46 PM       Radiology No results found.  Procedures Procedures (including critical care time)  Medications Ordered in ED Medications  sodium chloride 0.9 % bolus 500 mL (500 mLs Intravenous New Bag/Given 03/28/17 1615)  0.9 %  sodium chloride infusion ( Intravenous New Bag/Given 03/28/17 1616)  insulin aspart (novoLOG) injection 10 Units (10 Units Intravenous Given 03/28/17 1616)     Initial Impression / Assessment and Plan / ED Course  I have reviewed the triage vital signs and the nursing notes.  Pertinent labs & imaging results that were available during my care of the patient were reviewed by me and considered  in my medical decision making (see chart for details).     Consult: [facility] hospitalist for admission  Final Clinical Impressions(s) / ED Diagnoses   Final diagnoses:  Chronic atrial fibrillation ([place])  AKI (acute kidney injury) ([place])  Type 2 diabetes mellitus with hyperglycemia, without long-term current use of insulin [facility])  Homeless  Patient presents from his PCP office with findings of AK I and uncontrolled diabetes. Patient is alert and appropriate. No respiratory distress. Fluids and insulin initiated. Patient will be admitted for ongoing treatment and stabilization of acute exacerbation of chronic medical problems.  New Prescriptions New Prescriptions   No medications on file     Charlesetta Shanks, MD 03/28/17 [phone]

## 2017-03-28 NOTE — H&P (Signed)
History and Physical  Craig VANSCYOC TKZ:[id number] DOB: 1928/05/20 DOA: 03/28/2017  Referring physician: EDP PCP: Craig Huddle, MD   Chief Complaint: weakness, elevated blood sugar, homeless  HPI: Craig Whitehead is a 83 y.o. male   With h/o NPH s/p VP shunt, ataxia, h/o bladder cancer s/p surgery, h/o BPH,  h/o afib , not on anticoagulation, h/o diabetes is sent from primary care doctor's office to [facility] ED for evaluation of generalized weakness, elevated blood sugar and homeless situation.   ED course: patient is in chronic afib with heart rate in the low 100's, bp stable, no fever, labs significant with wbc 14, blood glusoe 455, bun 64, cr 1.9, troponin negative. He is given ns bolus 500cc, and 10units of novolog. hospitalist called to admit the patient due to elevated blood sugar and homeless situation. I have requested EDP to obtain UA.  Patient is a retired Restaurant manager, fast food, he is oriented x3 but not a reliable historian. He reports he has been homeless and lives in his car for the last four months because he was evicted from his apartment. However when I reviewed prior cardiology office note, he told his cardiologist similar story in 2017.  He also reports he has run out of his meds, the only meds he has been taking is mirabetriq.  He denies dysuria, denies blood in urine, denies sob, no pain. He does reports chronic ataxia, unsteady gaits and fall history.  Fortunately , pmd office sent office note, I was able to get more medical history. I saw prednisone on his meds list from pmd officenote and asks him why and how he is taking prednisone. he reports he has been on oral prednisone daily for a long time for his dermatitis on his hands. he reports has never been on topical steroids.  Review of Systems:  Detail per HPI, Review of systems are otherwise negative  Past Medical History:  Diagnosis Date  . Anxiety   . Atrial fibrillation ([place])   . Bladder cancer ([place])   . BPH  (benign prostatic hypertrophy)   . Diabetes mellitus ([place])   . Diarrhea   . Difficult intubation   . ED (erectile dysfunction)   . GERD (gastroesophageal reflux disease)   . Hypercholesteremia   . Hypertension   . NPH (normal pressure hydrocephalus)   . Seborrheic dermatitis   . Vitamin D deficiency    Past Surgical History:  Procedure Laterality Date  . BLADDER SURGERY     x 4 for bladder cancer  . BRAIN SURGERY    . CATARACT EXTRACTION Bilateral   . INGUINAL HERNIA REPAIR Bilateral   . TRANSTHORACIC ECHOCARDIOGRAM  05/2016   [facility]: EF 45-50%. Mildly reduced function (likely related to A. fib). Marketed LA dilation. Moderate RA dilation. Aortic sclerosis without stenosis. Mild-moderate TR. Mild-moderate pulmonary tension. Small pericardial effusion versus pericardial fat  . VENTRICULOPERITONEAL SHUNT  2002   Social History:  reports that he quit smoking about 22 years ago. He has never used smokeless tobacco. He reports that he drinks about 0.6 oz of alcohol per week . He reports that he does not use drugs. Patient is homeless, lives in his care & is able to participate in activities of daily living independently   No Known Allergies  Family History  Problem Relation Age of Onset  . Rheum arthritis Mother   . Cancer Father       Prior to Admission medications   Medication Sig Start Date End Date  Taking? Authorizing Provider  aspirin EC 81 MG tablet Take 1 tablet (81 mg total) by mouth daily. 06/27/16  Yes Craig Man, MD  cholecalciferol (VITAMIN D) 1000 UNITS tablet Take 4,000 Units by mouth daily.   Yes [provider]  cholestyramine (QUESTRAN) 4 g packet Take 1 packet (4 g total) by mouth daily. 08/06/16  Yes Craig Artist, MD  mirabegron ER (MYRBETRIQ) 50 MG TB24 tablet Take 50 mg by mouth daily.   Yes [provider]  solifenacin (VESICARE) 10 MG tablet Take 10 mg by mouth daily.   Yes [provider]     Physical Exam: BP 128/75 (BP Location: Right Arm)   Pulse 80   Temp 97.9 F (36.6 C) (Oral)   Resp 18   Ht 5\' 6"  (1.676 m)   Wt 57 kg (125 lb 9.6 oz)   SpO2 100%   BMI 20.27 kg/m   General:  Frail, thin, unkept, NAD, aaox3 Eyes: PERRL ENT:dry oral mucosa, poor dentition  Neck: supple, no JVD Cardiovascular: IRRR Respiratory: CTABL Abdomen: soft/ND/ND, positive bowel sounds Skin: mild dermatitis on bilateral hands Musculoskeletal:  No edema Psychiatric: calm/cooperative Neurologic: no focal findings            Labs on Admission:  Basic Metabolic Panel:  Recent Labs Lab 03/28/17 1446  NA 135  K 4.9  CL 107  CO2 17*  GLUCOSE 455*  BUN 64*  CREATININE 1.91*  CALCIUM 8.5*   Liver Function Tests:  Recent Labs Lab 03/28/17 1446  AST 17  ALT 28  ALKPHOS 62  BILITOT 0.8  PROT 5.7*  ALBUMIN 3.5   No results for input(s): LIPASE, AMYLASE in the last 168 hours. No results for input(s): AMMONIA in the last 168 hours. CBC:  Recent Labs Lab 03/28/17 1446  WBC 14.3*  NEUTROABS 12.0*  HGB 12.1*  HCT 36.1*  MCV 98.9  PLT 156   Cardiac Enzymes: No results for input(s): CKTOTAL, CKMB, CKMBINDEX, TROPONINI in the last 168 hours.  BNP (last 3 results) No results for input(s): BNP in the last 8760 hours.  ProBNP (last 3 results) No results for input(s): PROBNP in the last 8760 hours.  CBG:  Recent Labs Lab 03/28/17 0973  ZHGDJM 426*    Radiological Exams on Admission: No results found.  EKG: Independently reviewed. Afib, no acute st/t changes  Assessment/Plan Present on Admission: **None**  Noninsulin dependent diabetes Presented with elevated blood sugar, no gap  ( he reports has been on prednisone daily for a long time, not sure this is reliable) He has not been on any diabetes meds. he reports h/o metfromin intolerance due to diarrhea.  will check a1c, start ssi. Continue ivf,   AKI on CKDII: Cr 1.9 on presentation Cr 0.9 in  2014, cr 1.14 in 2017 renal US ordered UA pending collection,   Leukocytosis: from dehydartion? He reports has not been eating for two days No fever ua pending Continue hydration, repeat lab in am.  Chronic afib: rate controlled  He reports has not been on any heart meds for a while, he has not been taking asa either Per last cardiology office note, he is on ccb prn (due to h/o bradycardia) and asa Will start asa. On tele.  Chronic systolic chf, thought from chronic afib Last echo in 04/2016 lvef 45% Currently dry, on hydration, close monitor volume status Strict intake and output, daily weight  H/o NPH s/p VP shunt with chronic ataxia  Will get Pt eval,  fall precaution  H/o bladder cancer h/o BPH He denies dysuria, denies blood in the urine ua pending collection Start flomax  Chronic alcohol use: 2shots of bourbon daily, report has never had problem with intoxication or withdrawal start folate/ thiamine  Malnutrition, will have nutrition eval Body mass index is 20.27 kg/m.  Homeless situation: Education officer, museum consulted.   DVT prophylaxis: lovenox 30mg  subQ  Consultants: Education officer, museum  Code Status: DNR, confirmed with patient  Family Communication:  Patient ( he reports no siblings, no children,  however , when I review past medical records he does has brothers and son and daughters)  Disposition Plan: admit to med tele  Time spent: 35mins  Craig Roanhorse MD, PhD [facility] Pager [phone] If 7PM-7AM, please contact night-coverage at [email or web address], password [facility]

## 2017-03-28 NOTE — ED Notes (Signed)
Ordered diet tray 

## 2017-03-28 NOTE — ED Triage Notes (Signed)
Pt presents from [place] for regular exam.  Pt c/o weakness, has not been able to afford his medication, has been living out of his car.  CBG: 358; PIV established with 18g to L AC.

## 2017-03-29 ENCOUNTER — Encounter (HOSPITAL_COMMUNITY): Payer: Self-pay

## 2017-03-29 DIAGNOSIS — I482 Chronic atrial fibrillation, unspecified: Secondary | ICD-10-CM

## 2017-03-29 DIAGNOSIS — Z8551 Personal history of malignant neoplasm of bladder: Secondary | ICD-10-CM

## 2017-03-29 DIAGNOSIS — G912 (Idiopathic) normal pressure hydrocephalus: Secondary | ICD-10-CM | POA: Diagnosis present

## 2017-03-29 DIAGNOSIS — Z5900 Homelessness unspecified: Secondary | ICD-10-CM

## 2017-03-29 DIAGNOSIS — N179 Acute kidney failure, unspecified: Secondary | ICD-10-CM | POA: Diagnosis present

## 2017-03-29 DIAGNOSIS — E1165 Type 2 diabetes mellitus with hyperglycemia: Principal | ICD-10-CM

## 2017-03-29 DIAGNOSIS — N189 Chronic kidney disease, unspecified: Secondary | ICD-10-CM | POA: Diagnosis present

## 2017-03-29 DIAGNOSIS — D72829 Elevated white blood cell count, unspecified: Secondary | ICD-10-CM | POA: Diagnosis present

## 2017-03-29 DIAGNOSIS — I5022 Chronic systolic (congestive) heart failure: Secondary | ICD-10-CM | POA: Diagnosis present

## 2017-03-29 DIAGNOSIS — N400 Enlarged prostate without lower urinary tract symptoms: Secondary | ICD-10-CM | POA: Diagnosis present

## 2017-03-29 DIAGNOSIS — E43 Unspecified severe protein-calorie malnutrition: Secondary | ICD-10-CM | POA: Diagnosis present

## 2017-03-29 LAB — CBC
HCT: 32.5 % — ABNORMAL LOW (ref 39.0–52.0)
Hemoglobin: 10.8 g/dL — ABNORMAL LOW (ref 13.0–17.0)
MCH: 32.7 pg (ref 26.0–34.0)
MCHC: 33.2 g/dL (ref 30.0–36.0)
MCV: 98.5 fL (ref 78.0–100.0)
Platelets: 141 10*3/uL — ABNORMAL LOW (ref 150–400)
RBC: 3.3 MIL/uL — ABNORMAL LOW (ref 4.22–5.81)
RDW: 13.8 % (ref 11.5–15.5)
WBC: 9.2 10*3/uL (ref 4.0–10.5)

## 2017-03-29 LAB — COMPREHENSIVE METABOLIC PANEL
ALT: 23 U/L (ref 17–63)
AST: 14 U/L — AB (ref 15–41)
Albumin: 2.9 g/dL — ABNORMAL LOW (ref 3.5–5.0)
Alkaline Phosphatase: 48 U/L (ref 38–126)
Anion gap: 6 (ref 5–15)
BILIRUBIN TOTAL: 0.7 mg/dL (ref 0.3–1.2)
BUN: 47 mg/dL — AB (ref 6–20)
CO2: 17 mmol/L — ABNORMAL LOW (ref 22–32)
CREATININE: 1.36 mg/dL — AB (ref 0.61–1.24)
Calcium: 7.9 mg/dL — ABNORMAL LOW (ref 8.9–10.3)
Chloride: 113 mmol/L — ABNORMAL HIGH (ref 101–111)
GFR calc Af Amer: 52 mL/min — ABNORMAL LOW (ref >60)
GFR, EST NON AFRICAN AMERICAN: 45 mL/min — AB (ref >60)
Glucose, Bld: 219 mg/dL — ABNORMAL HIGH (ref 65–99)
POTASSIUM: 4 mmol/L (ref 3.5–5.1)
Sodium: 136 mmol/L (ref 135–145)
TOTAL PROTEIN: 4.8 g/dL — AB (ref 6.5–8.1)

## 2017-03-29 LAB — HEMOGLOBIN A1C
Hgb A1c MFr Bld: 11.9 % — ABNORMAL HIGH (ref 4.8–5.6)
Mean Plasma Glucose: 294.83 mg/dL

## 2017-03-29 LAB — GLUCOSE, CAPILLARY
GLUCOSE-CAPILLARY: 210 mg/dL — AB (ref 65–99)
GLUCOSE-CAPILLARY: 210 mg/dL — AB (ref 65–99)
GLUCOSE-CAPILLARY: 192 mg/dL — AB (ref 65–99)
Glucose-Capillary: 183 mg/dL — ABNORMAL HIGH (ref 65–99)
Glucose-Capillary: 251 mg/dL — ABNORMAL HIGH (ref 65–99)
Glucose-Capillary: 272 mg/dL — ABNORMAL HIGH (ref 65–99)

## 2017-03-29 MED ORDER — SODIUM CHLORIDE 0.9 % IV BOLUS (SEPSIS)
1000.0000 mL | Freq: Once | INTRAVENOUS | Status: AC
  Administered 2017-03-29: 1000 mL via INTRAVENOUS

## 2017-03-29 MED ORDER — SODIUM CHLORIDE 0.9 % IV SOLN
INTRAVENOUS | Status: DC
  Administered 2017-03-29 – 2017-04-01 (×5): via INTRAVENOUS
  Administered 2017-04-01: 1000 mL via INTRAVENOUS

## 2017-03-29 MED ORDER — ENSURE ENLIVE PO LIQD
237.0000 mL | Freq: Three times a day (TID) | ORAL | Status: DC
  Administered 2017-03-29 – 2017-03-31 (×8): 237 mL via ORAL

## 2017-03-29 MED ORDER — INSULIN GLARGINE 100 UNIT/ML ~~LOC~~ SOLN
8.0000 [IU] | Freq: Every day | SUBCUTANEOUS | Status: DC
  Administered 2017-03-29: 8 [IU] via SUBCUTANEOUS
  Filled 2017-03-29 (×2): qty 0.08

## 2017-03-29 NOTE — Evaluation (Signed)
Physical Therapy Evaluation Patient Details Name: Craig Whitehead MRN: [id number] DOB: 02-12-1928 Today's Date: 03/29/2017   History of Present Illness  83 yo male with onset of weakness and AKI with kidney stones was found to have 13.7% wgt loss in last 4 months, has been homeless and has food insecurity.  He is also consuming EtOH and not sure of quantities.  PMHx:  a-fib, HTN, DM, bladder CA, NPH wiht vp shunt, EF 45-50%, CHF,   Clinical Impression  Pt is demonstrating a poor control of gait without some help with his SPC and steadying balance.  He Is prepared to get up with PT and did note his willingness to stay up in the chair.  He will be seen acutely for strengthening and balance, then to complete safety education regarding use of RW or SPC as is appropriate.     Follow Up Recommendations SNF    Equipment Recommendations  Rolling walker with 5" wheels    Recommendations for Other Services       Precautions / Restrictions Precautions Precautions: Fall (telemetry, condom catheter) Restrictions Weight Bearing Restrictions: No      Mobility  Bed Mobility Overal bed mobility: Needs Assistance Bed Mobility: Supine to Sit     Supine to sit: Min guard;Min assist     General bed mobility comments: cues for mobility direction and assisted him under trunk  Transfers Overall transfer level: Needs assistance Equipment used: Rolling walker (2 wheeled);1 person hand held assist Transfers: Sit to/from Stand Sit to Stand: Min assist         General transfer comment: reminders for hand placement  Ambulation/Gait Ambulation/Gait assistance: Min assist Ambulation Distance (Feet): 45 Feet Assistive device: 1 person hand held assist;Straight cane Gait Pattern/deviations: Step-through pattern;Step-to pattern;Wide base of support;Shuffle;Decreased stride length;Trunk flexed Gait velocity: reduced Gait velocity interpretation: Below normal speed for age/gender General Gait  Details: SPC with cues for safety, assisted LUE and prompted him to stand tall, to maneuver around furniture safely  Stairs            Wheelchair Mobility    Modified Rankin (Stroke Patients Only)       Balance Overall balance assessment: History of Falls;Needs assistance Sitting-balance support: Feet supported Sitting balance-Leahy Scale: Fair     Standing balance support: Bilateral upper extremity supported;During functional activity Standing balance-Leahy Scale: Poor                               Pertinent Vitals/Pain Pain Assessment: No/denies pain    Home Living Family/patient expects to be discharged to:: Unsure                      Prior Function Level of Independence: Independent with assistive device(s)         Comments: has SPC which he uses for gait     Hand Dominance   Dominant Hand: Right    Extremity/Trunk Assessment   Upper Extremity Assessment Upper Extremity Assessment: Generalized weakness    Lower Extremity Assessment Lower Extremity Assessment: Generalized weakness    Cervical / Trunk Assessment Cervical / Trunk Assessment: Kyphotic  Communication   Communication: No difficulties  Cognition Arousal/Alertness: Awake/alert Behavior During Therapy: WFL for tasks assessed/performed Overall Cognitive Status: No family/caregiver present to determine baseline cognitive functioning  General Comments: has some unrealistic ideas about his physical abilities      General Comments General comments (skin integrity, edema, etc.): pt can stand with minimal help but with RW lists backward and is a safety hazard to use [facility] alone    Exercises     Assessment/Plan    PT Assessment Patient needs continued PT services  PT Problem List Decreased strength;Decreased range of motion;Decreased activity tolerance;Decreased balance;Decreased mobility;Decreased coordination;Decreased  knowledge of use of DME;Decreased safety awareness;Cardiopulmonary status limiting activity;Decreased skin integrity;Pain       PT Treatment Interventions DME instruction;Gait training;Functional mobility training;Therapeutic activities;Therapeutic exercise;Balance training;Neuromuscular re-education;Cognitive remediation    PT Goals (Current goals can be found in the Care Plan section)  Acute Rehab PT Goals Patient Stated Goal: to walk and feel stronger PT Goal Formulation: With patient Time For Goal Achievement: 04/12/17 Potential to Achieve Goals: Good    Frequency Min 2X/week   Barriers to discharge Decreased caregiver support;Inaccessible home environment living in his car per chart    Co-evaluation               AM-PAC PT "6 Clicks" Daily Activity  Outcome Measure Difficulty turning over in bed (including adjusting bedclothes, sheets and blankets)?: Unable Difficulty moving from lying on back to sitting on the side of the bed? : Unable Difficulty sitting down on and standing up from a chair with arms (e.g., wheelchair, bedside commode, etc,.)?: Unable Help needed moving to and from a bed to chair (including a wheelchair)?: A Little Help needed walking in hospital room?: A Little Help needed climbing 3-5 steps with a railing? : Total 6 Click Score: 10    End of Session Equipment Utilized During Treatment: Gait belt Activity Tolerance: Patient limited by fatigue Patient left: in chair;with call bell/phone within reach;with chair alarm set Nurse Communication: Mobility status PT Visit Diagnosis: Unsteadiness on feet (R26.81);Repeated falls (R29.6);Muscle weakness (generalized) (M62.81);Difficulty in walking, not elsewhere classified (R26.2);History of falling (Z91.81)    Time: 9702-6378 PT Time Calculation (min) (ACUTE ONLY): 33 min   Charges:   PT Evaluation $PT Eval Moderate Complexity: 1 Mod PT Treatments $Gait Training: 8-22 mins   PT G Codes:   PT G-Codes  **NOT FOR INPATIENT CLASS** Functional Assessment Tool Used: AM-PAC 6 Clicks Basic Mobility    Ramond Dial 03/29/2017, 3:24 PM   Mee Hives, PT MS Acute Rehab Dept. Number: [place] and [place]

## 2017-03-29 NOTE — Progress Notes (Signed)
PROGRESS NOTE    Craig Whitehead  VQM:[id number] DOB: 1928-03-28 DOA: 03/28/2017 PCP: Josetta Huddle, MD    Brief Narrative:  Patient is a 83 year old gentleman history of NPH status post VP shunt, ataxia, history of bladder cancer status post surgery, history of BPH, history of A. fib not on anticoagulation, history of diabetes sent from PCPs office to the ED for generalized weakness, elevated blood sugar and homelessness.   Assessment & Plan:   Principal Problem:   Hyperglycemia due to type 2 diabetes mellitus (HCC) Active Problems:   Persistent atrial fibrillation ([place]): CHA2DS2Vasc 2 (age). Rate controlled without medications asymptomatic   Weakness   Protein-calorie malnutrition, severe   Acute kidney injury superimposed on CKD (HCC)   Leukocytosis   Chronic systolic CHF (congestive heart failure) (HCC)   NPH (normal pressure hydrocephalus)   BPH (benign prostatic hyperplasia)   History of bladder cancer   Homelessness  Noninsulin dependent diabetes/hyperglycemia Presented with elevated blood sugar, with no anion gap.  The admitting physician patient had reported being on prednisone. It is noted that patient was unable to tolerate metformin in the past due to diarrhea. Hemoglobin A1c elevated at 11.9. CBGs have ranged from 210 - 251. Stat Lantus 8 units daily. Sliding scale insulin. Consult with diabetic coordinator.  AKI on CKDII: Likely secondary to prerenal azotemia. Creatinine on admission was 1.9. Renal function improved with hydration. Renal ultrasound negative for hydronephrosis. Possible stones within both kidneys. Continue IV fluids. Follow.  Leukocytosis: Likely reactive leukocytosis versus secondary to dehydration. Patient with poor oral intake. Patient is afebrile. Urinalysis negative for UTI. Leukocytosis is trending down. Continue IV fluids. Follow.  Chronic afib: rate controlled  CHA2DSVASC 2 Patient told admitting physician has not been on any cardiac  medications or aspirin in a while. Per cardiology's last office note patient on a calcium channel blocker as needed due to history of bradycardia. Continue aspirin.  Chronic systolic chf, thought from chronic afib Currently appears euvolemic. Last echo in 04/2016 lvef 45% Patient also noted to have some borderline blood pressure/hypotension. Continue IV fluids and monitor closely for volume overload.   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 Fall precautions.  H/o bladder cancer h/o BPH Patient currently asymptomatic. Urinalysis with greater than 500 protein, trace leukocytes, nitrite negative, protein negative, 0-5 WBCs. Continue Flomax.  Chronic alcohol use:  Per admitting physician patient drinks 2shots of bourbon daily, report has never had problem with intoxication or withdrawal. Patient does not seem in any acute withdrawal issue right now. Continue thiamine and folic acid. Follow.  Severe Protein calorie Malnutrition, Patient is seen by the dietitian. Continue nutritional supplementation. Body mass index is 20.27 kg/m.  Homeless situation:  social work consultation pending. PT/OT pending.  Borderline blood pressure/hypertension Give a bolus of normal saline IV fluids 1 now. Continue maintenance fluids.    DVT prophylaxis: Lovenox Code Status: DO NOT RESUSCITATE Family Communication: Updated patient. No family at bedside. Disposition Plan: To be determined   Consultants:   None  Procedures:   None  Antimicrobials:   None   Subjective: Patient laying in bed eating lunch. Patient denies any chest. No shortness of breath.  Objective: Vitals:   03/28/17 2044 03/29/17 1541 03/29/17 1852 03/29/17 1857  BP: 126/66 (!) 86/50 109/66 (!) 96/54  Pulse: 71 (!) 50 92 (!) 58  Resp: 18 14    Temp: 97.8 F (36.6 C) 98.5 F (36.9 C) 98.2 F (36.8 C)   TempSrc: Oral Oral Oral  SpO2: 100% 100% 99%   Weight:      Height:        Intake/Output Summary  (Last 24 hours) at 03/29/17 2000 Last data filed at 03/29/17 1629  Gross per 24 hour  Intake              410 ml  Output             1600 ml  Net            -1190 ml   Filed Weights   03/28/17 1438  Weight: 57 kg (125 lb 9.6 oz)    Examination:  General exam: Appears calm and comfortable  Respiratory system: Clear to auscultation Anterior lung fields. No wheezing, no crackles, rhonchi.Marland Kitchen Respiratory effort normal. Cardiovascular system: S1 & S2 heard, RRR. No JVD, murmurs, rubs, gallops or clicks. No pedal edema. Gastrointestinal system: Abdomen is nondistended, soft and nontender. No organomegaly or masses felt. Normal bowel sounds heard. Central nervous system: Alert and oriented. No focal neurological deficits. Extremities: Symmetric 5 x 5 power. Skin: No rashes, lesions or ulcers Psychiatry: Judgement and insight appear fair. Mood & affect appropriate.     Data Reviewed: I have personally reviewed following labs and imaging studies  CBC:  Recent Labs Lab 03/28/17 1446 03/29/17 0437  WBC 14.3* 9.2  NEUTROABS 12.0*  --   HGB 12.1* 10.8*  HCT 36.1* 32.5*  MCV 98.9 98.5  PLT 156 161*   Basic Metabolic Panel:  Recent Labs Lab 03/28/17 1446 03/29/17 0437  NA 135 136  K 4.9 4.0  CL 107 113*  CO2 17* 17*  GLUCOSE 455* 219*  BUN 64* 47*  CREATININE 1.91* 1.36*  CALCIUM 8.5* 7.9*   GFR: Estimated Creatinine Clearance: 30.9 mL/min (A) (by C-G formula based on SCr of 1.36 mg/dL (H)). Liver Function Tests:  Recent Labs Lab 03/28/17 1446 03/29/17 0437  AST 17 14*  ALT 28 23  ALKPHOS 62 48  BILITOT 0.8 0.7  PROT 5.7* 4.8*  ALBUMIN 3.5 2.9*   No results for input(s): LIPASE, AMYLASE in the last 168 hours. No results for input(s): AMMONIA in the last 168 hours. Coagulation Profile: No results for input(s): INR, PROTIME in the last 168 hours. Cardiac Enzymes: No results for input(s): CKTOTAL, CKMB, CKMBINDEX, TROPONINI in the last 168 hours. BNP (last 3  results) No results for input(s): PROBNP in the last 8760 hours. HbA1C:  Recent Labs  03/29/17 0437  HGBA1C 11.9*   CBG:  Recent Labs Lab 03/29/17 0038 03/29/17 0604 03/29/17 0758 03/29/17 1148 03/29/17 1654  GLUCAP 272* 210* 210* 251* 183*   Lipid Profile: No results for input(s): CHOL, HDL, LDLCALC, TRIG, CHOLHDL, LDLDIRECT in the last 72 hours. Thyroid Function Tests: No results for input(s): TSH, T4TOTAL, FREET4, T3FREE, THYROIDAB in the last 72 hours. Anemia Panel: No results for input(s): VITAMINB12, FOLATE, FERRITIN, TIBC, IRON, RETICCTPCT in the last 72 hours. Sepsis Labs: No results for input(s): PROCALCITON, LATICACIDVEN in the last 168 hours.  No results found for this or any previous visit (from the past 240 hour(s)).       Radiology Studies: US Renal  Result Date: 03/28/2017 CLINICAL DATA:  Acute kidney injury EXAM: RENAL / URINARY TRACT ULTRASOUND COMPLETE COMPARISON:  08/21/2015 FINDINGS: Right Kidney: Length: 9.8 cm. Echogenicity within normal limits. No mass or hydronephrosis visualized. Echogenic focus in the lower pole measuring 6 mm, stone versus cortical calcification. Probable hypoechoic cyst upper pole right kidney measuring 0.8 x 0.8 x 0.8  cm. Left Kidney: Length: 10.2 cm. Echogenicity within normal limits. No mass or hydronephrosis visualized. Shadowing focus in the lower pole measuring 1.4 cm, probable stone. Re- demonstrated septated cyst in the upper pole measuring 3.5 x 3.1 x 3.6 cm, previously 3.4 x 3.3 x 3.2 cm. In the lower pole of the left kidney is a cyst measuring 6.2 x 6 x 6.2 cm, previously 4.6 x 5.6 x 5.1 cm. Bladder: Appears normal for degree of bladder distention. IMPRESSION: 1. Negative for hydronephrosis 2. Possible stones within both kidneys 3. 8 mm probable hypoechoic cyst right kidney 4. Stable septated cyst in the upper left kidney. Slight increase in size of a left lower pole cyst, now measuring 6.2 cm maximum Electronically Signed    By: Donavan Foil M.D.   On: 03/28/2017 18:37        Scheduled Meds: . aspirin EC  81 mg Oral Daily  . enoxaparin (LOVENOX) injection  30 mg Subcutaneous Q24H  . feeding supplement (ENSURE ENLIVE)  237 mL Oral TID BM  . folic acid  1 mg Oral Daily  . insulin aspart  0-9 Units Subcutaneous TID WC  . insulin aspart  9 Units Intravenous Once  . insulin glargine  8 Units Subcutaneous Daily  . tamsulosin  0.4 mg Oral Daily  . thiamine  100 mg Oral Daily  . triamcinolone 0.1 % cream : eucerin   Topical BID   Continuous Infusions: . sodium chloride 75 mL/hr at 03/29/17 1121     LOS: 1 day    Time spent: 62 minutes    Biruk Troia, MD [facility] Pager [phone]  If 7PM-7AM, please contact night-coverage [email or web address] Password [email or web address] 03/29/2017, 8:00 PM

## 2017-03-29 NOTE — Progress Notes (Signed)
Initial Nutrition Assessment  DOCUMENTATION CODES:  Severe malnutrition in context of chronic illness  INTERVENTION:  Pt severely malnourished with severe muscle/fat wasting and loss of 20 lbs (13.7% bw x4 months). Question cognitive etiology? -potentially has dementia or other impairment that has caused confusion/poor intake. Also could be drinking more etoh than reports  Pt makes it sound like he is able to [employer]. His REPORTED diet does not contain much to increase BG-question accuracy.  Ensure Enlive po TID, each supplement provides 350 kcal and 20 grams of protein. He would benefit from extra kcals/pro in this vs glucerna.   NUTRITION DIAGNOSIS:  Malnutrition (Severe, thought in chronic context) related to poor appetite, social / environmental circumstances?, etoh abuse?, confusion? as evidenced by severe muscle/fat loss and a loss of 13.7% bw x 4 months.   GOAL:  Patient will meet greater than or equal to 90% of their needs  MONITOR:  PO intake, Supplement acceptance, Labs, Weight trends, Skin  REASON FOR ASSESSMENT:  Consult Assessment of nutrition requirement/status vs Diet education?  ASSESSMENT:  83 y/o male PMHx Bph, Afib, DM, Anxiety, GERD, HTN, homeless. Sent from PCP office due to generalized weakness, elevated BG and homeless situation. Worked up for AKI, Leukocytosis and admitted for management.   Nutrition consult was placed for diet education, however, after reading MD note, consult thought more related to overall nutritional assessment.   Patient mostly would just say yes to questions. Unsure how reliable his history is.   He denies any n/v/c/d. When asked if he had any sickness, he replied "no sickness, just weak".   He says he has not eaten for two days. He does not know why he did this. RD asked if he just didn't feel like it and he said "yes". RD asked if he was indeed homeless and he says "recently". When asked if he lives in car, he replies  "Sometimes". When asked if he is able to afford food, he states "not a whole lot"... "I get by". He says he usually eats 2x a day, breakfast and dinner. He mostly eats fast food. Most meals are "something w/ chicken". He drinks 1 soda/day. He does not eat fries when he gets fast food. Denies eating pastas, rice, potatoes, sugary beverages other than 1 soda, sweets, baked goods. He drinks 2 shots bourbon/day.  His REPORTED diet did not really contain much that, alone, would elevate his BG to the levels he presented with. He is on steroids. He says he has to have these or he itches all over. He says he sad terrible diarrhea when he was on metformin.  Regarding his weight, he appears to have had substantial wt loss. He says his ubw is 185 lbs?? Per chart, he was 145 lbs in May of this year; he is now 125 lbs. This is a clinically significant loss of 13.7% in 4 months. His UBW actually appears to be ~150-155 lbs.   Difficult to know why he has had rapid wt loss. He appears to have some confusion/cognitive difficulty, potentially affecting PO intake. Could be drinking more than reports. "Homeless" situation could be worse than he reports.   Physical Exam: Severe fat wasting of thorax. Severe muscle wasting of quadriceps and interosseous. Mild-moderate wasting of deltoids/clavicular musculature, gastrocnemius, temporalis.   Labs: BGs now down to 210 from 455, A1C was  11.9-avg of 295 mg/dl, WBC now wdl, Albumin:2.9, bun/creat:47/1.36 Meds:Ensure Enlive BID, Folate, thiamin, insulin   Recent Labs Lab 03/28/17 1446 03/29/17 0437  NA 135 136  K 4.9 4.0  CL 107 113*  CO2 17* 17*  BUN 64* 47*  CREATININE 1.91* 1.36*  CALCIUM 8.5* 7.9*  GLUCOSE 455* 219*    Diet Order:  Diet Carb Modified Fluid consistency: Thin; Room service appropriate? Yes  Skin:Abrasion to buttocks  Last BM:  Unknown  Height:  Ht Readings from Last 1 Encounters:  03/28/17 5\' 6"  (1.676 m)  -subjectively appears taller.    Weight:  Wt Readings from Last 1 Encounters:  03/28/17 125 lb 9.6 oz (57 kg)   Wt Readings from Last 10 Encounters:  03/28/17 125 lb 9.6 oz (57 kg)  12/05/16 145 lb (65.8 kg)  08/06/16 149 lb 9.6 oz (67.9 kg)  06/27/16 150 lb 12.8 oz (68.4 kg)  05/31/16 153 lb 3.2 oz (69.5 kg)  05/13/16 153 lb (69.4 kg)  09/06/14 157 lb (71.2 kg)  07/06/14 155 lb (70.3 kg)  07/01/14 170 lb (77.1 kg)   Ideal Body Weight:  64.55 kg  BMI:  Body mass index is 20.27 kg/m.  Estimated Nutritional Needs:  Kcal:  1700-1900 (30-33 kcal/kg bw) Protein:  74-86 (1.3-1.5g/kg bw) Fluid:  >1.4 L fluid (25 ml/kg bw)  EDUCATION NEEDS:  Education needs no appropriate at this time  Burtis Junes RD, LDN, [place] Clinical Nutrition Pager: [phone] 03/29/2017 10:32 AM

## 2017-03-30 DIAGNOSIS — G912 (Idiopathic) normal pressure hydrocephalus: Secondary | ICD-10-CM

## 2017-03-30 DIAGNOSIS — N189 Chronic kidney disease, unspecified: Secondary | ICD-10-CM

## 2017-03-30 LAB — BASIC METABOLIC PANEL
Anion gap: 8 (ref 5–15)
BUN: 39 mg/dL — ABNORMAL HIGH (ref 6–20)
CALCIUM: 7.4 mg/dL — AB (ref 8.9–10.3)
CO2: 18 mmol/L — ABNORMAL LOW (ref 22–32)
CREATININE: 1.42 mg/dL — AB (ref 0.61–1.24)
Chloride: 111 mmol/L (ref 101–111)
GFR, EST AFRICAN AMERICAN: 50 mL/min — AB (ref >60)
GFR, EST NON AFRICAN AMERICAN: 43 mL/min — AB (ref >60)
Glucose, Bld: 368 mg/dL — ABNORMAL HIGH (ref 65–99)
Potassium: 4.6 mmol/L (ref 3.5–5.1)
SODIUM: 137 mmol/L (ref 135–145)

## 2017-03-30 LAB — CBC
HCT: 31.3 % — ABNORMAL LOW (ref 39.0–52.0)
Hemoglobin: 10.4 g/dL — ABNORMAL LOW (ref 13.0–17.0)
MCH: 33.1 pg (ref 26.0–34.0)
MCHC: 33.2 g/dL (ref 30.0–36.0)
MCV: 99.7 fL (ref 78.0–100.0)
PLATELETS: 120 10*3/uL — AB (ref 150–400)
RBC: 3.14 MIL/uL — AB (ref 4.22–5.81)
RDW: 14.2 % (ref 11.5–15.5)
WBC: 6.9 10*3/uL (ref 4.0–10.5)

## 2017-03-30 LAB — MAGNESIUM: MAGNESIUM: 1.5 mg/dL — AB (ref 1.7–2.4)

## 2017-03-30 LAB — GLUCOSE, CAPILLARY
GLUCOSE-CAPILLARY: 283 mg/dL — AB (ref 65–99)
GLUCOSE-CAPILLARY: 292 mg/dL — AB (ref 65–99)
Glucose-Capillary: 318 mg/dL — ABNORMAL HIGH (ref 65–99)
Glucose-Capillary: 234 mg/dL — ABNORMAL HIGH (ref 65–99)

## 2017-03-30 MED ORDER — INSULIN GLARGINE 100 UNIT/ML ~~LOC~~ SOLN
10.0000 [IU] | Freq: Every day | SUBCUTANEOUS | Status: DC
  Administered 2017-03-30: 10 [IU] via SUBCUTANEOUS
  Filled 2017-03-30 (×2): qty 0.1

## 2017-03-30 MED ORDER — CHOLESTYRAMINE 4 G PO PACK
4.0000 g | PACK | Freq: Every day | ORAL | Status: DC
  Administered 2017-03-31 – 2017-04-01 (×2): 4 g via ORAL
  Filled 2017-03-30 (×2): qty 1

## 2017-03-30 MED ORDER — INSULIN STARTER KIT- PEN NEEDLES (ENGLISH)
1.0000 | Freq: Once | Status: DC
  Filled 2017-03-30: qty 1

## 2017-03-30 MED ORDER — VITAMIN D 1000 UNITS PO TABS
4000.0000 [IU] | ORAL_TABLET | Freq: Every day | ORAL | Status: DC
  Administered 2017-03-31 – 2017-04-01 (×2): 4000 [IU] via ORAL
  Filled 2017-03-30 (×2): qty 4

## 2017-03-30 MED ORDER — LIVING WELL WITH DIABETES BOOK
Freq: Once | Status: AC
  Administered 2017-03-30: 13:00:00
  Filled 2017-03-30: qty 1

## 2017-03-30 MED ORDER — DARIFENACIN HYDROBROMIDE ER 7.5 MG PO TB24
7.5000 mg | ORAL_TABLET | Freq: Every day | ORAL | Status: DC
  Administered 2017-03-31 – 2017-04-01 (×2): 7.5 mg via ORAL
  Filled 2017-03-30 (×2): qty 1

## 2017-03-30 MED ORDER — MAGNESIUM SULFATE 4 GM/100ML IV SOLN
4.0000 g | Freq: Once | INTRAVENOUS | Status: AC
  Administered 2017-03-30: 4 g via INTRAVENOUS
  Filled 2017-03-30: qty 100

## 2017-03-30 MED ORDER — SODIUM BICARBONATE 650 MG PO TABS
650.0000 mg | ORAL_TABLET | Freq: Two times a day (BID) | ORAL | Status: DC
  Administered 2017-03-30 – 2017-04-01 (×5): 650 mg via ORAL
  Filled 2017-03-30 (×5): qty 1

## 2017-03-30 NOTE — Progress Notes (Signed)
Pt had beats of V tach, MD on call notified.

## 2017-03-30 NOTE — Progress Notes (Signed)
Inpatient Diabetes Program Recommendations  AACE/ADA: New Consensus Statement on Inpatient Glycemic Control (2015)  Target Ranges:  Prepandial:   less than 140 mg/dL      Peak postprandial:   less than 180 mg/dL (1-2 hours)      Critically ill patients:  140 - 180 mg/dL  Results for Craig Whitehead, Craig Whitehead (MRN [id number]) as of 03/30/2017 12:15  Ref. Range 03/29/2017 07:58 03/29/2017 11:48 03/29/2017 16:54 03/29/2017 21:40 03/30/2017 07:25  Glucose-Capillary Latest Ref Range: 65 - 99 mg/dL 210 (H) 251 (H) 183 (H) 192 (H) 318 (H)   Results for Craig Whitehead, Craig Whitehead (MRN [id number]) as of 03/30/2017 12:15  Ref. Range 03/28/2017 14:46 03/29/2017 04:37 03/30/2017 04:13  Glucose Latest Ref Range: 65 - 99 mg/dL 455 (H) 219 (H) 368 (H)  Hemoglobin A1C Latest Ref Range: 4.8 - 5.6 %  11.9 (H)    Review of Glycemic Control  Diabetes history: Unclear; per ED note on 12/05/16 pt has hx of DM2 Outpatient Diabetes medications: None Current orders for Inpatient glycemic control: Lantus 10 units daily, Novolog 0-9 units TID with meals  Inpatient Diabetes Program Recommendations: Insulin - Basal: Please consider increasing Lantus to 12 units daily. Correction (SSI): Please consider ordering Novolog 0-5 units QHS for bedtime correction scale. Insulin - Meal Coverage: If patient is eating at least 50% of meals, please consider ordering Novolog 3 units TID wtih meals for meal coverage. HgbA1C: A1C 11.9% on 03/29/17. Patient will need DM medication as an outpatient and follow up with PCP regarding DM control.  NOTE: Noted Diabetes Coordinator consult; chart reviewed. Diabetes Coordinator not on campus over the weekend but available for questions or concerns by pager from 8am-5pm.  In reviewing chart, noted DM hx noted on ED note dated 12/05/16 but unclear if patient has an actual DM dx or not. Per H&P, patient takes steroids chronically for dermatitis and is currently homeless and living out of car. A1C 11.9% on 03/29/17. MD, please make  notation in progress not of discharge plan for DM control. Please note that insulin will need to be kept cool which may be an issue if patient is living out of his car.  Ordered: Living Well with Diabetes booklet, insulin starter kit (in case pt will discharge on insulin), RD consult, patient education by bedside RNs, [facility] CM consult for outpatient follow up, and DM education videos. SW consult already ordered regarding homelessness. Diabetes Coordinator will plan to see patient on 03/31/17.  NURSING: Please use each patient interaction to provide diabetes education. Please review Living Well with Diabetes booklet with the patient, have patient watch patient education videos on diabetes, and instruct on insulin administration. Please allow patient to be actively engaged with diabetes management by allowing patient to check own glucose and self-administer insulin injections.   Thanks, Barnie Alderman, RN, MSN, CDE Diabetes Coordinator Inpatient Diabetes Program [phone] (Team Pager from 8am to 5pm)

## 2017-03-30 NOTE — Progress Notes (Signed)
PROGRESS NOTE    Craig EARNSHAW  QZE:[id number] DOB: 1928-06-14 DOA: 03/28/2017 PCP: Josetta Huddle, MD    Brief Narrative:  Patient is a 83 year old gentleman history of NPH status post VP shunt, ataxia, history of bladder cancer status post surgery, history of BPH, history of A. fib not on anticoagulation, history of diabetes sent from PCPs office to the ED for generalized weakness, elevated blood sugar and homelessness.   Assessment & Plan:   Principal Problem:   Hyperglycemia due to type 2 diabetes mellitus (HCC) Active Problems:   Persistent atrial fibrillation ([place]): CHA2DS2Vasc 2 (age). Rate controlled without medications asymptomatic   Weakness   Protein-calorie malnutrition, severe   Acute kidney injury superimposed on CKD (HCC)   Leukocytosis   Chronic systolic CHF (congestive heart failure) (HCC)   NPH (normal pressure hydrocephalus)   BPH (benign prostatic hyperplasia)   History of bladder cancer   Homelessness   AKI (acute kidney injury) ([place])   Chronic atrial fibrillation ([place])  Noninsulin dependent diabetes/hyperglycemia Presented with elevated blood sugar, with no anion gap.  The admitting physician patient had reported being on prednisone. It is noted that patient was unable to tolerate metformin in the past due to diarrhea. Hemoglobin A1c elevated at 11.9. CBGs have ranged from 192 - 318. Increase Lantus to 10 units daily. Sliding scale insulin. Consult with diabetic coordinator.  AKI on CKDII: Likely secondary to prerenal azotemia. Creatinine on admission was 1.9. Renal function improved with hydration. Renal ultrasound negative for hydronephrosis. Possible stones within both kidneys. Continue IV fluids. Follow.  Leukocytosis: Likely reactive leukocytosis versus secondary to dehydration. Patient with poor oral intake. Patient is afebrile. Urinalysis negative for UTI. Leukocytosis is trending down. Continue IV fluids. Follow.  Chronic afib: rate controlled   CHA2DSVASC 2 Patient told admitting physician has not been on any cardiac medications or aspirin in a while. Per cardiology's last office note patient on a calcium channel blocker as needed due to history of bradycardia. Continue aspirin.  Chronic systolic chf, thought from chronic afib Currently euvolemic. Last echo in 04/2016 lvef 45% Patient also noted to have some borderline blood pressure/hypotension which has improved with hydration.. Continue IV fluids and monitor closely for volume overload.   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 Fall precautions.  H/o bladder cancer h/o BPH Patient currently asymptomatic. Urinalysis with greater than 500 protein, trace leukocytes, nitrite negative, protein negative, 0-5 WBCs. Continue Flomax.  Chronic alcohol use:  Per admitting physician patient drinks 2 shots of bourbon daily, report has never had problem with intoxication or withdrawal. Patient does not seem in any acute withdrawal issue right now. Continue thiamine and folic acid. Follow.  Severe Protein calorie Malnutrition, Patient is seen by the dietitian. Continue nutritional supplementation. Body mass index is 20.27 kg/m.  Homeless situation:  social work consultation pending. PT/OT feels patient will benefit from skilled nursing facility. Social work consultation pending.   Borderline blood pressure/hypertension Blood pressure improved. Continue IV fluids.     DVT prophylaxis: Lovenox Code Status: DO NOT RESUSCITATE Family Communication: Updated patient. No family at bedside. Disposition Plan: Likely skilled nursing facility   Consultants:   None  Procedures:   None  Antimicrobials:   None   Subjective: Patient on the toilet. No chest pain. No shortness of breath.   Objective: Vitals:   03/29/17 1857 03/29/17 2144 03/30/17 0439 03/30/17 1517  BP: (!) 96/54 115/79 123/65 118/67  Pulse: (!) 58  65 87  Resp:  16  16 (!) 22  Temp:  97.7 F (36.5  C) 97.7 F (36.5 C) 98.3 F (36.8 C)  TempSrc:  Oral  Oral  SpO2:  98% 98% 100%  Weight:      Height:        Intake/Output Summary (Last 24 hours) at 03/30/17 1638 Last data filed at 03/30/17 1500  Gross per 24 hour  Intake          1708.33 ml  Output             2250 ml  Net          -541.67 ml   Filed Weights   03/28/17 1438  Weight: 57 kg (125 lb 9.6 oz)    Examination:  General exam: Appears calm and comfortable  Respiratory system: Clear to auscultation Anterior lung fields. No wheezing, no crackles, rhonchi.Marland Kitchen Respiratory effort normal. Cardiovascular system: S1 & S2 heard, RRR. No JVD, murmurs, rubs, gallops or clicks. No pedal edema. Gastrointestinal system: Abdomen is Soft, nontender, nondistended, positive bowel sounds. No rebound. No guarding.  Central nervous system: Alert and oriented. No focal neurological deficits. Extremities: Symmetric 5 x 5 power. Skin: No rashes, lesions or ulcers Psychiatry: Judgement and insight appear fair. Mood & affect appropriate.     Data Reviewed: I have personally reviewed following labs and imaging studies  CBC:  Recent Labs Lab 03/28/17 1446 03/29/17 0437 03/30/17 0413  WBC 14.3* 9.2 6.9  NEUTROABS 12.0*  --   --   HGB 12.1* 10.8* 10.4*  HCT 36.1* 32.5* 31.3*  MCV 98.9 98.5 99.7  PLT 156 141* 426*   Basic Metabolic Panel:  Recent Labs Lab 03/28/17 1446 03/29/17 0437 03/30/17 0413 03/30/17 1224  NA 135 136 137  --   K 4.9 4.0 4.6  --   CL 107 113* 111  --   CO2 17* 17* 18*  --   GLUCOSE 455* 219* 368*  --   BUN 64* 47* 39*  --   CREATININE 1.91* 1.36* 1.42*  --   CALCIUM 8.5* 7.9* 7.4*  --   MG  --   --   --  1.5*   GFR: Estimated Creatinine Clearance: 29.5 mL/min (A) (by C-G formula based on SCr of 1.42 mg/dL (H)). Liver Function Tests:  Recent Labs Lab 03/28/17 1446 03/29/17 0437  AST 17 14*  ALT 28 23  ALKPHOS 62 48  BILITOT 0.8 0.7  PROT 5.7* 4.8*  ALBUMIN 3.5 2.9*   No results for  input(s): LIPASE, AMYLASE in the last 168 hours. No results for input(s): AMMONIA in the last 168 hours. Coagulation Profile: No results for input(s): INR, PROTIME in the last 168 hours. Cardiac Enzymes: No results for input(s): CKTOTAL, CKMB, CKMBINDEX, TROPONINI in the last 168 hours. BNP (last 3 results) No results for input(s): PROBNP in the last 8760 hours. HbA1C:  Recent Labs  03/29/17 0437  HGBA1C 11.9*   CBG:  Recent Labs Lab 03/29/17 1148 03/29/17 1654 03/29/17 2140 03/30/17 0725 03/30/17 1311  GLUCAP 251* 183* 192* 318* 234*   Lipid Profile: No results for input(s): CHOL, HDL, LDLCALC, TRIG, CHOLHDL, LDLDIRECT in the last 72 hours. Thyroid Function Tests: No results for input(s): TSH, T4TOTAL, FREET4, T3FREE, THYROIDAB in the last 72 hours. Anemia Panel: No results for input(s): VITAMINB12, FOLATE, FERRITIN, TIBC, IRON, RETICCTPCT in the last 72 hours. Sepsis Labs: No results for input(s): PROCALCITON, LATICACIDVEN in the last 168 hours.  No results found for this or any previous visit (from  the past 240 hour(s)).       Radiology Studies: US Renal  Result Date: 03/28/2017 CLINICAL DATA:  Acute kidney injury EXAM: RENAL / URINARY TRACT ULTRASOUND COMPLETE COMPARISON:  08/21/2015 FINDINGS: Right Kidney: Length: 9.8 cm. Echogenicity within normal limits. No mass or hydronephrosis visualized. Echogenic focus in the lower pole measuring 6 mm, stone versus cortical calcification. Probable hypoechoic cyst upper pole right kidney measuring 0.8 x 0.8 x 0.8 cm. Left Kidney: Length: 10.2 cm. Echogenicity within normal limits. No mass or hydronephrosis visualized. Shadowing focus in the lower pole measuring 1.4 cm, probable stone. Re- demonstrated septated cyst in the upper pole measuring 3.5 x 3.1 x 3.6 cm, previously 3.4 x 3.3 x 3.2 cm. In the lower pole of the left kidney is a cyst measuring 6.2 x 6 x 6.2 cm, previously 4.6 x 5.6 x 5.1 cm. Bladder: Appears normal for  degree of bladder distention. IMPRESSION: 1. Negative for hydronephrosis 2. Possible stones within both kidneys 3. 8 mm probable hypoechoic cyst right kidney 4. Stable septated cyst in the upper left kidney. Slight increase in size of a left lower pole cyst, now measuring 6.2 cm maximum Electronically Signed   By: Donavan Foil M.D.   On: 03/28/2017 18:37        Scheduled Meds: . aspirin EC  81 mg Oral Daily  . [START ON 03/31/2017] cholecalciferol  4,000 Units Oral Daily  . [START ON 03/31/2017] cholestyramine  4 g Oral Daily  . [START ON 03/31/2017] darifenacin  7.5 mg Oral Daily  . enoxaparin (LOVENOX) injection  30 mg Subcutaneous Q24H  . feeding supplement (ENSURE ENLIVE)  237 mL Oral TID BM  . folic acid  1 mg Oral Daily  . insulin aspart  0-9 Units Subcutaneous TID WC  . insulin aspart  9 Units Intravenous Once  . insulin glargine  10 Units Subcutaneous Daily  . insulin starter kit- pen needles  1 kit Other Once  . sodium bicarbonate  650 mg Oral BID  . tamsulosin  0.4 mg Oral Daily  . thiamine  100 mg Oral Daily  . triamcinolone 0.1 % cream : eucerin   Topical BID   Continuous Infusions: . sodium chloride 100 mL/hr at 03/30/17 0536  . magnesium sulfate 1 - 4 g bolus IVPB       LOS: 2 days    Time spent: 35 minutes    ,, MD [facility] Pager [phone]  If 7PM-7AM, please contact night-coverage [email or web address] Password [facility] 03/30/2017, 4:38 PM

## 2017-03-31 LAB — BASIC METABOLIC PANEL
ANION GAP: 6 (ref 5–15)
BUN: 32 mg/dL — ABNORMAL HIGH (ref 6–20)
CO2: 21 mmol/L — AB (ref 22–32)
Calcium: 7.5 mg/dL — ABNORMAL LOW (ref 8.9–10.3)
Chloride: 109 mmol/L (ref 101–111)
Creatinine, Ser: 1.27 mg/dL — ABNORMAL HIGH (ref 0.61–1.24)
GFR calc Af Amer: 57 mL/min — ABNORMAL LOW (ref >60)
GFR calc non Af Amer: 49 mL/min — ABNORMAL LOW (ref >60)
GLUCOSE: 384 mg/dL — AB (ref 65–99)
POTASSIUM: 4 mmol/L (ref 3.5–5.1)
Sodium: 136 mmol/L (ref 135–145)

## 2017-03-31 LAB — GLUCOSE, CAPILLARY
GLUCOSE-CAPILLARY: 258 mg/dL — AB (ref 65–99)
GLUCOSE-CAPILLARY: 154 mg/dL — AB (ref 65–99)
Glucose-Capillary: 354 mg/dL — ABNORMAL HIGH (ref 65–99)
Glucose-Capillary: 207 mg/dL — ABNORMAL HIGH (ref 65–99)

## 2017-03-31 LAB — MAGNESIUM: Magnesium: 2.3 mg/dL (ref 1.7–2.4)

## 2017-03-31 MED ORDER — INSULIN ASPART 100 UNIT/ML ~~LOC~~ SOLN
3.0000 [IU] | Freq: Three times a day (TID) | SUBCUTANEOUS | Status: DC
  Administered 2017-03-31 – 2017-04-01 (×4): 3 [IU] via SUBCUTANEOUS

## 2017-03-31 MED ORDER — INSULIN GLARGINE 100 UNIT/ML ~~LOC~~ SOLN
14.0000 [IU] | Freq: Every day | SUBCUTANEOUS | Status: DC
  Administered 2017-03-31 – 2017-04-01 (×2): 14 [IU] via SUBCUTANEOUS
  Filled 2017-03-31 (×2): qty 0.14

## 2017-03-31 NOTE — Progress Notes (Addendum)
Brief Nutrition Follow-Up Note  RD re-consulted for diet education.   RD evaluated pt on 03/29/17; please refer to progress notes for further details. Of note, significant recent wt loss may be related to uncontrolled DM.  Pt sleeping soundly at time of visit and unable to arise pt enough to participate in interview; noted DM education videos playing on TV at time of visit. Pt also with confusion, so unsure how much information pt will retain. Per CSW note, plan to d/c to SNF.  Noted current orders for glycemic control  0-9 units insulin aspart TID with meals, 3 units insulin aspart TID with meals, and 14 units insulin glargine daily.   Noted CBGS: 937-902. No information in regards to meal intake. Pt with severe malnutrition and would benefit from nutrient dense supplement. One Ensure Enlive supplement provides 350 kcals, 20 grams protein, and 44-45 grams of carbohydrate vs one Glucerna shake supplement, which provides 220 kcals, 10 grams of protein, and 26 grams of carbohydrate. Given pt's hx of DM, RD will continue to monitor PO intake, CBGS, and adjust supplement regimen as appropriate.   RD will continue to follow.  Craig Whitehead A. Jimmye Norman, RD, LDN, CDE Pager: [phone] After hours Pager: [phone]

## 2017-03-31 NOTE — Clinical Social Work Note (Signed)
Clinical Social Work Assessment  Patient Details  Name: Craig Whitehead MRN: [id number] Date of Birth: Jun 11, 1928  Date of referral:  03/31/17               Reason for consult:  Facility Placement                Permission sought to share information with:  Facility Art therapist granted to share information::  Yes, Verbal Permission Granted  Name::        Agency::  SNFs  Relationship::     Contact Information:     Housing/Transportation Living arrangements for the past 2 months:  Homeless Source of Information:  Patient Patient Interpreter Needed:  None Criminal Activity/Legal Involvement Pertinent to Current Situation/Hospitalization:  No - Comment as needed Significant Relationships:  None Lives with:  Self Do you feel safe going back to the place where you live?  No Need for family participation in patient care:  No (Coment)  Care giving concerns:  CSW received consult for possible SNF placement at time of discharge. CSW spoke with patient regarding PT recommendation of SNF placement at time of discharge. Patient reported that he lives in his car and is agreeable to SNF placement at time of discharge. CSW to continue to follow and assist with discharge planning needs.   Social Worker assessment / plan:  CSW spoke with patient concerning possibility of rehab at [facility].  Employment status:  Retired Forensic scientist:  Medicare PT Recommendations:  [place] / Referral to community resources:  [place]  Patient/Family's Response to care:  Patient recognizes need for rehab and is agreeable to a SNF in [place]. Patient reported that he does not want CSW to call his brother, that "he doesn't care about me".  Patient/Family's Understanding of and Emotional Response to Diagnosis, Current Treatment, and Prognosis:  Patient/family is realistic regarding therapy needs and expressed being hopeful for SNF placement.  Patient expressed understanding of CSW role and discharge process as well as medical condition. No questions/concerns about plan or treatment.    Emotional Assessment Appearance:  Appears stated age Attitude/Demeanor/Rapport:  Other (Appropriate) Affect (typically observed):  Accepting, Appropriate Orientation:  Oriented to Self, Oriented to Situation, Oriented to Place, Oriented to  Time Alcohol / Substance use:  Not Applicable Psych involvement (Current and /or in the community):  No (Comment)  Discharge Needs  Concerns to be addressed:  Care Coordination Readmission within the last 30 days:  No Current discharge risk:  None Barriers to Discharge:  Continued Medical Work up   [employer], [place] 03/31/2017, 4:41 PM

## 2017-03-31 NOTE — Consult Note (Signed)
   Craig Whitehead   03/31/2017  Craig Whitehead [birth year] [id number]     [facility] Care Management referral received. Chart reviewed. Noted discharge plan is for SNF. Spoke with inpatient RNCM to confirm.  No identifiable [facility] Care Management needs.  Discussed with inpatient RNCM.   Marthenia Rolling, MSN-Ed, RN,BSN [facility] Liaison [phone]

## 2017-03-31 NOTE — [place] FL2 (Signed)
[place] MEDICAID FL2 LEVEL OF CARE SCREENING TOOL     IDENTIFICATION  Patient Name: Craig Whitehead Birthdate: [birth year] Sex: male Admission Date (Current Location): 03/28/2017  [facility] and [place] Number:  Herbalist and Address:  [facility], [place] [address], [place], [place] [address]      Provider Number: [id number]  Attending Physician Name and Address:  Eugenie Filler, MD  Relative Name and Phone Number:  Craig Whitehead, brother, [phone]    Current Level of Care: Hospital Recommended Level of Care: [place] Prior Approval Number:    Date Approved/Denied:   PASRR Number: [id number]  Discharge Plan: SNF    Current Diagnoses: Patient Active Problem List   Diagnosis Date Noted  . Protein-calorie malnutrition, severe 03/29/2017  . Hyperglycemia due to type 2 diabetes mellitus ([place]) 03/29/2017  . Acute kidney injury superimposed on CKD ([place]) 03/29/2017  . Leukocytosis 03/29/2017  . Chronic systolic CHF (congestive heart failure) ([place]) 03/29/2017  . NPH (normal pressure hydrocephalus) 03/29/2017  . BPH (benign prostatic hyperplasia) 03/29/2017  . History of bladder cancer 03/29/2017  . Homelessness 03/29/2017  . AKI (acute kidney injury) ([place])   . Chronic atrial fibrillation ([place])   . Weakness 03/28/2017  . Persistent atrial fibrillation ([place]): CHA2DS2Vasc 2 (age). Rate controlled without medications asymptomatic 06/02/2016  . Medication management 05/31/2016  . Major depressive disorder, recurrent severe without psychotic features ([place]) 05/13/2016    Orientation RESPIRATION BLADDER Height & Weight     Self, Time, Situation, Place  Normal Continent, External catheter Weight: 57 kg (125 lb 9.6 oz) Height:  '5\' 6"'$  (167.6 cm)  BEHAVIORAL SYMPTOMS/MOOD NEUROLOGICAL BOWEL NUTRITION STATUS      Continent Diet (Please see DC Summary)  AMBULATORY STATUS COMMUNICATION OF NEEDS Skin   Limited Assist Verbally  Normal                       Personal Care Assistance Level of Assistance  Bathing, Feeding, Dressing Bathing Assistance: Maximum assistance Feeding assistance: Independent Dressing Assistance: Limited assistance     Functional Limitations Info             SPECIAL CARE FACTORS FREQUENCY  PT (By licensed PT)     PT Frequency: 5x/week              Contractures      Additional Factors Info  Code Status, Allergies, Insulin Sliding Scale Code Status Info: DNR Allergies Info: NKA   Insulin Sliding Scale Info: 3x daily with meals       Current Medications (03/31/2017):  This is the current hospital active medication list Current Facility-Administered Medications  Medication Dose Route Frequency Provider Last Rate Last Dose  . 0.9 %  sodium chloride infusion   Intravenous Continuous Eugenie Filler, MD 75 mL/hr at 03/31/17 [phone]    . aspirin EC tablet 81 mg  81 mg Oral Daily Honor Loh, Alianza   81 mg at 03/30/17 0931  . cholecalciferol (VITAMIN D) tablet 4,000 Units  4,000 Units Oral Daily Eugenie Filler, MD      . cholestyramine Lucrezia Starch) packet 4 g  4 g Oral Daily Eugenie Filler, MD      . darifenacin (ENABLEX) 24 hr tablet 7.5 mg  7.5 mg Oral Daily Eugenie Filler, MD      . enoxaparin (LOVENOX) injection 30 mg  30 mg Subcutaneous Q24H Florencia Reasons, MD   30 mg at 03/31/17 2536  .  feeding supplement (ENSURE ENLIVE) (ENSURE ENLIVE) liquid 237 mL  237 mL Oral TID BM Eugenie Filler, MD   237 mL at 03/31/17 1000  . folic acid (FOLVITE) tablet 1 mg  1 mg Oral Daily Florencia Reasons, MD   1 mg at 03/30/17 0932  . insulin aspart (novoLOG) injection 0-9 Units  0-9 Units Subcutaneous TID WC Florencia Reasons, MD   9 Units at 03/31/17 [phone]  . insulin aspart (novoLOG) injection 3 Units  3 Units Subcutaneous TID WC Eugenie Filler, MD      . insulin aspart (novoLOG) injection 9 Units  9 Units Intravenous Once Blount, Scarlette Shorts T, NP      . insulin glargine (LANTUS) injection 14  Units  14 Units Subcutaneous Daily Eugenie Filler, MD      . insulin starter kit- pen needles (English) 1 kit  1 kit Other Once Eugenie Filler, MD      . sodium bicarbonate tablet 650 mg  650 mg Oral BID Eugenie Filler, MD   650 mg at 03/30/17 2151  . tamsulosin (FLOMAX) capsule 0.4 mg  0.4 mg Oral Daily Florencia Reasons, MD   0.4 mg at 03/30/17 0932  . thiamine (VITAMIN B-1) tablet 100 mg  100 mg Oral Daily Florencia Reasons, MD   100 mg at 03/30/17 0932  . triamcinolone 0.1 % cream : eucerin cream, 1:1   Topical BID Florencia Reasons, MD         Discharge Medications: Please see discharge summary for a list of discharge medications.  Relevant Imaging Results:  Relevant Lab Results:   Additional Information SSN: [place], [place]

## 2017-03-31 NOTE — Progress Notes (Addendum)
PROGRESS NOTE    Craig Whitehead  WER:[id number] DOB: 06/06/1928 DOA: 03/28/2017 PCP: Josetta Huddle, MD    Brief Narrative:  Patient is a 83 year old gentleman history of NPH status post VP shunt, ataxia, history of bladder cancer status post surgery, history of BPH, history of A. fib not on anticoagulation, history of diabetes sent from PCPs office to the ED for generalized weakness, elevated blood sugar and homelessness.   Assessment & Plan:   Principal Problem:   Hyperglycemia due to type 2 diabetes mellitus (HCC) Active Problems:   Persistent atrial fibrillation ([place]): CHA2DS2Vasc 2 (age). Rate controlled without medications asymptomatic   Weakness   Protein-calorie malnutrition, severe   Acute kidney injury superimposed on CKD (HCC)   Leukocytosis   Chronic systolic CHF (congestive heart failure) (HCC)   NPH (normal pressure hydrocephalus)   BPH (benign prostatic hyperplasia)   History of bladder cancer   Homelessness   AKI (acute kidney injury) ([place])   Chronic atrial fibrillation ([place])  Noninsulin dependent diabetes/hyperglycemia Presented with elevated blood sugar, with no anion gap.  The admitting physician patient had reported being on prednisone. It is noted that patient was unable to tolerate metformin in the past due to diarrhea. Hemoglobin A1c elevated at 11.9. CBGs have ranged from 207 - 354. Increase Lantus to 14 units daily. Sliding scale insulin. Consult with diabetic coordinator.  AKI on CKDII: Likely secondary to prerenal azotemia. Creatinine on admission was 1.9. Creatinine trending down with hydration. Renal ultrasound negative for hydronephrosis. Possible stones within both kidneys. Continue IV fluids. Follow.  Leukocytosis: Likely reactive leukocytosis versus secondary to dehydration. Patient with poor oral intake. Patient is afebrile. Urinalysis negative for UTI. Leukocytosis is trending down. Continue IV fluids. Follow.  Chronic afib: rate  controlled  CHA2DSVASC 2 Patient told admitting physician has not been on any cardiac medications or aspirin in a while. Per cardiology's last office note patient on a calcium channel blocker as needed due to history of bradycardia. Continue aspirin.  Chronic systolic chf, thought from chronic afib Currently euvolemic. Last echo in 04/2016 lvef 45% Patient also noted to have some borderline blood pressure/hypotension which has improved with hydration.. Continue IV fluids and monitor closely for volume overload.   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 Fall precautions.  H/o bladder cancer h/o BPH Patient currently asymptomatic. Urinalysis with greater than 500 protein, trace leukocytes, nitrite negative, protein negative, 0-5 WBCs. Continue Flomax.  Chronic alcohol use:  Per admitting physician patient drinks 2 shots of bourbon daily, report has never had problem with intoxication or withdrawal. Patient not exhibiting any signs of withdrawal. Continue thiamine and folic acid.  Severe Protein calorie Malnutrition, Patient is seen by the dietitian. Continue nutritional supplementation. Body mass index is 20.27 kg/m.  Homeless situation:  social work consultation pending. PT/OT feels patient will benefit from skilled nursing facility. Social work consultation pending.   Borderline blood pressure/hypertension Blood pressure improved. Continue IV fluids.     DVT prophylaxis: Lovenox Code Status: DO NOT RESUSCITATE Family Communication: Updated patient. No family at bedside. Disposition Plan: Likely skilled nursing facility    Consultants:   None  Procedures:   None  Antimicrobials:   None   Subjective: Patient eating lunch. States he feels well. No chest pain. No shortness of breath. No nausea. No vomiting.  Objective: Vitals:   03/30/17 1517 03/30/17 2047 03/31/17 0538 03/31/17 1436  BP: 118/67 (!) 111/57 115/70 (!) 129/49  Pulse: 87 (!) 52 (!) 104 82  Resp: (!) 22 18 (!) 21 16  Temp: 98.3 F (36.8 C) 97.6 F (36.4 C) 98.2 F (36.8 C) 97.9 F (36.6 C)  TempSrc: Oral Oral Oral Oral  SpO2: 100% 99% 98% 98%  Weight:      Height:        Intake/Output Summary (Last 24 hours) at 03/31/17 1627 Last data filed at 03/31/17 1300  Gross per 24 hour  Intake              340 ml  Output             3200 ml  Net            -2860 ml   Filed Weights   03/28/17 1438  Weight: 57 kg (125 lb 9.6 oz)    Examination:  General exam: Appears calm and comfortable  Respiratory system: Clear to auscultation bilaterally. No crackles, no rhonchi, no wheezing. Cardiovascular system: S1 & S2 heard, RRR. No JVD, murmurs, rubs, gallops or clicks. No pedal edema. Gastrointestinal system: Abdomen is Soft, nontender, nondistended, positive bowel sounds. No rebound. No guarding.  Central nervous system: Alert and oriented. No focal neurological deficits. Extremities: Symmetric 5 x 5 power. Skin: No rashes, lesions or ulcers Psychiatry: Judgement and insight appear fair. Mood & affect appropriate.     Data Reviewed: I have personally reviewed following labs and imaging studies  CBC:  Recent Labs Lab 03/28/17 1446 03/29/17 0437 03/30/17 0413  WBC 14.3* 9.2 6.9  NEUTROABS 12.0*  --   --   HGB 12.1* 10.8* 10.4*  HCT 36.1* 32.5* 31.3*  MCV 98.9 98.5 99.7  PLT 156 141* 102*   Basic Metabolic Panel:  Recent Labs Lab 03/28/17 1446 03/29/17 0437 03/30/17 0413 03/30/17 1224 03/31/17 0250  NA 135 136 137  --  136  K 4.9 4.0 4.6  --  4.0  CL 107 113* 111  --  109  CO2 17* 17* 18*  --  21*  GLUCOSE 455* 219* 368*  --  384*  BUN 64* 47* 39*  --  32*  CREATININE 1.91* 1.36* 1.42*  --  1.27*  CALCIUM 8.5* 7.9* 7.4*  --  7.5*  MG  --   --   --  1.5* 2.3   GFR: Estimated Creatinine Clearance: 33 mL/min (A) (by C-G formula based on SCr of 1.27 mg/dL (H)). Liver Function Tests:  Recent Labs Lab 03/28/17 1446 03/29/17 0437  AST 17 14*  ALT  28 23  ALKPHOS 62 48  BILITOT 0.8 0.7  PROT 5.7* 4.8*  ALBUMIN 3.5 2.9*   No results for input(s): LIPASE, AMYLASE in the last 168 hours. No results for input(s): AMMONIA in the last 168 hours. Coagulation Profile: No results for input(s): INR, PROTIME in the last 168 hours. Cardiac Enzymes: No results for input(s): CKTOTAL, CKMB, CKMBINDEX, TROPONINI in the last 168 hours. BNP (last 3 results) No results for input(s): PROBNP in the last 8760 hours. HbA1C:  Recent Labs  03/29/17 0437  HGBA1C 11.9*   CBG:  Recent Labs Lab 03/30/17 1311 03/30/17 1636 03/30/17 2045 03/31/17 0759 03/31/17 1226  GLUCAP 234* 283* 292* 354* 207*   Lipid Profile: No results for input(s): CHOL, HDL, LDLCALC, TRIG, CHOLHDL, LDLDIRECT in the last 72 hours. Thyroid Function Tests: No results for input(s): TSH, T4TOTAL, FREET4, T3FREE, THYROIDAB in the last 72 hours. Anemia Panel: No results for input(s): VITAMINB12, FOLATE, FERRITIN, TIBC, IRON, RETICCTPCT in the last 72 hours. Sepsis Labs: No results for  input(s): PROCALCITON, LATICACIDVEN in the last 168 hours.  No results found for this or any previous visit (from the past 240 hour(s)).       Radiology Studies: No results found.      Scheduled Meds: . aspirin EC  81 mg Oral Daily  . cholecalciferol  4,000 Units Oral Daily  . cholestyramine  4 g Oral Daily  . darifenacin  7.5 mg Oral Daily  . enoxaparin (LOVENOX) injection  30 mg Subcutaneous Q24H  . feeding supplement (ENSURE ENLIVE)  237 mL Oral TID BM  . folic acid  1 mg Oral Daily  . insulin aspart  0-9 Units Subcutaneous TID WC  . insulin aspart  3 Units Subcutaneous TID WC  . insulin aspart  9 Units Intravenous Once  . insulin glargine  14 Units Subcutaneous Daily  . insulin starter kit- pen needles  1 kit Other Once  . sodium bicarbonate  650 mg Oral BID  . tamsulosin  0.4 mg Oral Daily  . thiamine  100 mg Oral Daily  . triamcinolone 0.1 % cream : eucerin   Topical  BID   Continuous Infusions: . sodium chloride 75 mL/hr at 03/31/17 0519     LOS: 3 days    Time spent: 12 minutes    THOMPSON,DANIEL, MD [facility] Pager [phone]  If 7PM-7AM, please contact night-coverage [email or web address] Password [facility] 03/31/2017, 4:27 PM

## 2017-04-01 DIAGNOSIS — I481 Persistent atrial fibrillation: Secondary | ICD-10-CM

## 2017-04-01 DIAGNOSIS — R2689 Other abnormalities of gait and mobility: Secondary | ICD-10-CM | POA: Diagnosis not present

## 2017-04-01 DIAGNOSIS — N182 Chronic kidney disease, stage 2 (mild): Secondary | ICD-10-CM | POA: Diagnosis not present

## 2017-04-01 DIAGNOSIS — E43 Unspecified severe protein-calorie malnutrition: Secondary | ICD-10-CM | POA: Diagnosis not present

## 2017-04-01 DIAGNOSIS — R488 Other symbolic dysfunctions: Secondary | ICD-10-CM | POA: Diagnosis not present

## 2017-04-01 DIAGNOSIS — Z8551 Personal history of malignant neoplasm of bladder: Secondary | ICD-10-CM | POA: Diagnosis not present

## 2017-04-01 DIAGNOSIS — I130 Hypertensive heart and chronic kidney disease with heart failure and stage 1 through stage 4 chronic kidney disease, or unspecified chronic kidney disease: Secondary | ICD-10-CM | POA: Diagnosis not present

## 2017-04-01 DIAGNOSIS — I5022 Chronic systolic (congestive) heart failure: Secondary | ICD-10-CM | POA: Diagnosis not present

## 2017-04-01 DIAGNOSIS — E1365 Other specified diabetes mellitus with hyperglycemia: Secondary | ICD-10-CM | POA: Diagnosis not present

## 2017-04-01 DIAGNOSIS — N179 Acute kidney failure, unspecified: Secondary | ICD-10-CM | POA: Diagnosis not present

## 2017-04-01 DIAGNOSIS — L8990 Pressure ulcer of unspecified site, unspecified stage: Secondary | ICD-10-CM | POA: Insufficient documentation

## 2017-04-01 DIAGNOSIS — M6281 Muscle weakness (generalized): Secondary | ICD-10-CM | POA: Diagnosis not present

## 2017-04-01 DIAGNOSIS — N400 Enlarged prostate without lower urinary tract symptoms: Secondary | ICD-10-CM | POA: Diagnosis not present

## 2017-04-01 DIAGNOSIS — Z590 Homelessness: Secondary | ICD-10-CM | POA: Diagnosis not present

## 2017-04-01 DIAGNOSIS — D72829 Elevated white blood cell count, unspecified: Secondary | ICD-10-CM | POA: Diagnosis not present

## 2017-04-01 DIAGNOSIS — E1165 Type 2 diabetes mellitus with hyperglycemia: Secondary | ICD-10-CM | POA: Diagnosis not present

## 2017-04-01 DIAGNOSIS — I482 Chronic atrial fibrillation: Secondary | ICD-10-CM | POA: Diagnosis not present

## 2017-04-01 DIAGNOSIS — N189 Chronic kidney disease, unspecified: Secondary | ICD-10-CM | POA: Diagnosis not present

## 2017-04-01 DIAGNOSIS — G912 (Idiopathic) normal pressure hydrocephalus: Secondary | ICD-10-CM | POA: Diagnosis not present

## 2017-04-01 DIAGNOSIS — R5381 Other malaise: Secondary | ICD-10-CM | POA: Diagnosis not present

## 2017-04-01 LAB — MAGNESIUM: MAGNESIUM: 1.6 mg/dL — AB (ref 1.7–2.4)

## 2017-04-01 LAB — BASIC METABOLIC PANEL
ANION GAP: 5 (ref 5–15)
BUN: 28 mg/dL — AB (ref 6–20)
CHLORIDE: 110 mmol/L (ref 101–111)
CO2: 22 mmol/L (ref 22–32)
Calcium: 7.7 mg/dL — ABNORMAL LOW (ref 8.9–10.3)
Creatinine, Ser: 1.15 mg/dL (ref 0.61–1.24)
GFR calc Af Amer: >60 mL/min (ref >60)
GFR, EST NON AFRICAN AMERICAN: 55 mL/min — AB (ref >60)
Glucose, Bld: 192 mg/dL — ABNORMAL HIGH (ref 65–99)
POTASSIUM: 4 mmol/L (ref 3.5–5.1)
SODIUM: 137 mmol/L (ref 135–145)

## 2017-04-01 LAB — GLUCOSE, CAPILLARY
GLUCOSE-CAPILLARY: 167 mg/dL — AB (ref 65–99)
GLUCOSE-CAPILLARY: 165 mg/dL — AB (ref 65–99)
Glucose-Capillary: 155 mg/dL — ABNORMAL HIGH (ref 65–99)

## 2017-04-01 LAB — TSH: TSH: 2.793 u[IU]/mL (ref 0.350–4.500)

## 2017-04-01 MED ORDER — GLUCERNA SHAKE PO LIQD: 237.0000 mL | Freq: Three times a day (TID) | ORAL | 0 refills | Status: DC

## 2017-04-01 MED ORDER — THIAMINE HCL 100 MG PO TABS: 100.0000 mg | ORAL_TABLET | Freq: Every day | ORAL | Status: DC

## 2017-04-01 MED ORDER — TRIAMCINOLONE 0.1 % CREAM:EUCERIN CREAM 1:1: 1.0000 "application " | TOPICAL_CREAM | Freq: Two times a day (BID) | CUTANEOUS | Status: DC

## 2017-04-01 MED ORDER — INSULIN ASPART 100 UNIT/ML ~~LOC~~ SOLN: 3.0000 [IU] | Freq: Three times a day (TID) | SUBCUTANEOUS | 0 refills | Status: DC

## 2017-04-01 MED ORDER — TAMSULOSIN HCL 0.4 MG PO CAPS: 0.4000 mg | ORAL_CAPSULE | Freq: Every day | ORAL | 0 refills | Status: DC

## 2017-04-01 MED ORDER — INSULIN GLARGINE 100 UNIT/ML ~~LOC~~ SOLN: 14.0000 [IU] | Freq: Every day | SUBCUTANEOUS | 0 refills | Status: DC

## 2017-04-01 MED ORDER — GLUCERNA SHAKE PO LIQD: 237.0000 mL | Freq: Three times a day (TID) | ORAL | Status: DC

## 2017-04-01 MED ORDER — FOLIC ACID 1 MG PO TABS: 1.0000 mg | ORAL_TABLET | Freq: Every day | ORAL | Status: DC

## 2017-04-01 MED ORDER — MAGNESIUM SULFATE 4 GM/100ML IV SOLN
4.0000 g | Freq: Once | INTRAVENOUS | Status: AC
  Administered 2017-04-01: 4 g via INTRAVENOUS
  Filled 2017-04-01: qty 100

## 2017-04-01 NOTE — Progress Notes (Signed)
Physical Therapy Treatment Patient Details Name: Craig Whitehead MRN: [id number] DOB: 1928/02/13 Today's Date: 04/01/2017    History of Present Illness 83 yo male with onset of weakness and AKI with kidney stones was found to have 13.7% wgt loss in last 4 months, has been homeless and has food insecurity.  He is also consuming EtOH and not sure of quantities.  PMHx:  a-fib, HTN, DM, bladder CA, NPH wiht vp shunt, EF 45-50%, CHF,     PT Comments    Pt tolerated increased ambulation today, 150', much safer with RW than cane. Needed only light min A most of the time but by last 25' was very fatigued and having difficulty advancing LE's, needed full 25% assist to ambulate safely. HR 85-95 bpm with activity. 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 PT will continue to follow.    Follow Up Recommendations  SNF     Equipment Recommendations  Rolling walker with 5" wheels    Recommendations for Other Services       Precautions / Restrictions Precautions Precautions: Fall Restrictions Weight Bearing Restrictions: No    Mobility  Bed Mobility Overal bed mobility: Needs Assistance Bed Mobility: Supine to Sit     Supine to sit: Min assist     General bed mobility comments: pt needed assist managing covers and min A for trunk elevation  Transfers Overall transfer level: Needs assistance Equipment used: Rolling walker (2 wheeled);Straight cane Transfers: Sit to/from Stand Sit to Stand: Min guard         General transfer comment: pt first stood with SPC, min-guard A for safety. Subsequent times stood to RW with min-guard A from bed and supervision from toielt with use of grab bars  Ambulation/Gait Ambulation/Gait assistance: Min assist Ambulation Distance (Feet): 150 Feet Assistive device: Rolling walker (2 wheeled);Straight cane Gait Pattern/deviations: Decreased weight shift to  right;Step-through pattern Gait velocity: decreased Gait velocity interpretation: Below normal speed for age/gender General Gait Details: pt unsteady with SPC and heavy min A needed for safety. Changed to RW and was able to ambulate with light min A. Drifted R consistently, cued to attend to pushing R side RW and was able to straighten course. Fatigued by 125' and needed heavy min A again for last 25' back to bed.    Stairs            Wheelchair Mobility    Modified Rankin (Stroke Patients Only)       Balance Overall balance assessment: History of Falls;Needs assistance Sitting-balance support: Feet supported Sitting balance-Leahy Scale: Fair     Standing balance support: Bilateral upper extremity supported;During functional activity Standing balance-Leahy Scale: Poor                              Cognition Arousal/Alertness: Awake/alert Behavior During Therapy: WFL for tasks assessed/performed Overall Cognitive Status: No family/caregiver present to determine baseline cognitive functioning                                 General Comments: 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 Doubting that he is remembering this currently.       Exercises      General Comments General comments (skin integrity, edema, etc.): went to the bathroom twice to try to urinate  and was unable      Pertinent Vitals/Pain Pain Assessment: No/denies pain    Home Living                      Prior Function            PT Goals (current goals can now be found in the care plan section) Acute Rehab PT Goals Patient Stated Goal: to walk and feel stronger PT Goal Formulation: With patient Time For Goal Achievement: 04/12/17 Potential to Achieve Goals: Good Progress towards PT goals: Progressing toward goals    Frequency    Min 2X/week      PT Plan Current plan remains appropriate    Co-evaluation               AM-PAC PT "6 Clicks" Daily Activity  Outcome Measure  Difficulty turning over in bed (including adjusting bedclothes, sheets and blankets)?: Unable Difficulty moving from lying on back to sitting on the side of the bed? : Unable Difficulty sitting down on and standing up from a chair with arms (e.g., wheelchair, bedside commode, etc,.)?: A Little Help needed moving to and from a bed to chair (including a wheelchair)?: A Little Help needed walking in hospital room?: A Little Help needed climbing 3-5 steps with a railing? : Total 6 Click Score: 12    End of Session Equipment Utilized During Treatment: Gait belt Activity Tolerance: Patient tolerated treatment well Patient left: in bed;with call bell/phone within reach;with nursing/sitter in room Nurse Communication: Mobility status PT Visit Diagnosis: Unsteadiness on feet (R26.81);Repeated falls (R29.6);Muscle weakness (generalized) (M62.81);Difficulty in walking, not elsewhere classified (R26.2);History of falling (Z91.81)     Time: 3888-2800 PT Time Calculation (min) (ACUTE ONLY): 26 min  Charges:  $Gait Training: 23-37 mins                    G Codes:       [place]  [place] 04/01/2017, 1:34 PM

## 2017-04-01 NOTE — Care Management Important Message (Signed)
Important Message  Patient Details  Name: Craig Whitehead MRN: [id number] Date of Birth: Jun 24, 1928   Medicare Important Message Given:  Yes    Nathen May 04/01/2017, 9:53 AM

## 2017-04-01 NOTE — Progress Notes (Addendum)
Per MD verbal order, order for Echocardiogram was D/C. Patient has order to be discharged to SNF.

## 2017-04-01 NOTE — Progress Notes (Signed)
Patient will DC to: [facility] Anticipated DC date: 04/01/17  Family notified: N/A Transport by: Corey Harold   Per MD patient ready for DC to [facility]. RN, patient, patient's family, and facility notified of DC. Discharge Summary sent to facility. RN given number for report [phone]). DC packet on chart. Ambulance transport requested for patient.   CSW signing off.  Cedric Fishman, [place] Social Worker [phone]

## 2017-04-01 NOTE — Clinical Social Work Placement (Signed)
   CLINICAL SOCIAL WORK PLACEMENT  NOTE  Date:  04/01/2017  Patient Details  Name: DERAL SCHELLENBERG MRN: [id number] Date of Birth: Dec 30, 1927  Clinical Social Work is seeking post-discharge placement for this patient at the [place] level of care (*CSW will initial, date and re-position this form in  chart as items are completed):  Yes   Patient/family provided with [place] Work Department's list of facilities offering this level of care within the geographic area requested by the patient (or if unable, by the patient's family).  Yes   Patient/family informed of their freedom to choose among providers that offer the needed level of care, that participate in Medicare, Medicaid or managed care program needed by the patient, have an available bed and are willing to accept the patient.  Yes   Patient/family informed of [facility]'s ownership interest in [facility] and [facility], as well as of the fact that they are under no obligation to receive care at these facilities.  PASRR submitted to EDS on 03/31/17     PASRR number received on 03/31/17     Existing PASRR number confirmed on       FL2 transmitted to all facilities in geographic area requested by pt/family on 03/31/17     FL2 transmitted to all facilities within larger geographic area on       Patient informed that his/her managed care company has contracts with or will negotiate with certain facilities, including the following:        Yes   Patient/family informed of bed offers received.  Patient chooses bed at [facility]     Physician recommends and patient chooses bed at      Patient to be transferred to [facility] on 04/01/17.  Patient to be transferred to facility by [facility]     Patient family notified on 04/01/17 of transfer.  Name of family member notified:  N/A     PHYSICIAN       Additional Comment:     _______________________________________________ Benard Halsted, [place] 04/01/2017, 4:16 PM

## 2017-04-01 NOTE — Progress Notes (Signed)
Pt had a 2.42 secs pause on telemetry. Patient asymptomatic. MD notified.

## 2017-04-01 NOTE — Progress Notes (Signed)
Pt had a 2.35secs pause on telemetry. MD notified. Will pass on care to the day RN.

## 2017-04-01 NOTE — Progress Notes (Signed)
Patient was discharged to nursing home [facility]) by MD order; discharged instructions review and sent to facility via PTAR with care notes and prescriptions; IV DIC; patient will be transported to facility via [place]. Facility was called for report with no result. RN will sent the phone number via PTAR.

## 2017-04-01 NOTE — Discharge Summary (Signed)
Physician Discharge Summary  Craig Whitehead:[id number] DOB: July 27, 1927 DOA: 03/28/2017  PCP: Josetta Huddle, MD  Admit date: 03/28/2017 Discharge date: 04/01/2017  Time spent: 65 minutes  Recommendations for Outpatient Follow-up:  1. Patient be discharged to skilled nursing facility. Follow-up with M.D. at skilled nursing facility. Patient's blood sugars need to be monitored and insulin adjusted as needed for better blood glucose control.   Discharge Diagnoses:  Principal Problem:   Hyperglycemia due to type 2 diabetes mellitus ([place]) Active Problems:   Persistent atrial fibrillation ([place]): CHA2DS2Vasc 2 (age). Rate controlled without medications asymptomatic   Weakness   Protein-calorie malnutrition, severe   Acute kidney injury superimposed on CKD (HCC)   Leukocytosis   Chronic systolic CHF (congestive heart failure) (HCC)   NPH (normal pressure hydrocephalus)   BPH (benign prostatic hyperplasia)   History of bladder cancer   Homelessness   AKI (acute kidney injury) ([place])   Chronic atrial fibrillation (HCC)   Pressure injury of skin   Discharge Condition: Stable and improved.  Diet recommendation: Carb modified diet.  Filed Weights   03/28/17 1438  Weight: 57 kg (125 lb 9.6 oz)    History of present illness:  Per Dr Barbera Setters is a 83 y.o. male   With h/o NPH s/p VP shunt, ataxia, h/o bladder cancer s/p surgery, h/o BPH,  h/o afib , not on anticoagulation, h/o diabetes is sent from primary care doctor's office to [facility] ED for evaluation of generalized weakness, elevated blood sugar and homeless situation.   ED course: patient is in chronic afib with heart rate in the low 100's, bp stable, no fever, labs significant with wbc 14, blood glusoe 455, bun 64, cr 1.9, troponin negative. He is given ns bolus 500cc, and 10units of novolog. hospitalist called to admit the patient due to elevated blood sugar and homeless situation. I have requested EDP to obtain  UA.  Patient is a retired Restaurant manager, fast food, he is oriented x3 but not a reliable historian. He reported he has been homeless and lives in his car for the last four months because he was evicted from his apartment. However when I reviewed prior cardiology office note, he told his cardiologist similar story in 2017.  He also reported he has run out of his meds, the only meds he has been taking is mirabetriq.  He denies dysuria, denies blood in urine, denies sob, no pain. He does reports chronic ataxia, unsteady gaits and fall history.  Fortunately , pmd office sent office note, I was able to get more medical history. I saw prednisone on his meds list from pmd officenote and asks him why and how he is taking prednisone. he reported he has been on oral prednisone daily for a long time for his dermatitis on his hands. he reports has never been on topical steroids  Hospital Course:  Noninsulin dependent diabetes/hyperglycemia Presented with elevated blood sugar, with no anion gap.  The admitting physician patient had reported being on prednisone. It is noted that patient was unable to tolerate metformin in the past due to diarrhea. Hemoglobin A1c elevated at 11.9. Patient was placed on Lantus and dose adjusted for better blood glucose control. Patient was also maintained on sliding-scale insulin as well as milk coverage NovoLog 3 units daily. Lantus was increased up to 14 units daily with CBGs ranging from 154 -167. Outpatient follow-up.   AKI on CKDII: Likely secondary to prerenal azotemia. Creatinine on admission was 1.9. Creatinine trended down with  hydration. Renal ultrasound negative for hydronephrosis. Possible stones within both kidneys. Creatinine was down to 1.15 by day of discharge. Outpatient follow-up.   Leukocytosis: Likely reactive leukocytosis versus secondary to dehydration. Patient with poor oral intake. Patient is afebrile. Urinalysis negative for UTI. Leukocytosis is trended down.  Patient hydrated with IV fluids and leukocytosis had resolved by day of discharge.   Chronic afib: rate controlled  CHA2DSVASC 2/nocturnal pauses 2.3, 2.6 seconds Patient told admitting physician has not been on any cardiac medications or aspirin in a while. Per cardiology's last office note patient on a calcium channel blocker as needed due to history of bradycardia. Patient remained in A. fib however rate controlled. Patient had some nocturnal pauses of 2.3 and 2.6 seconds however remained asymptomatic. Patient's nocturnal pauses were discussed with EP who felt no further workup was needed. Continued on aspirin.  Chronic systolic chf, thought from chronic afib Patient remained euvolemic throughout the hospitalization.  Last echo in 04/2016 lvef 45% Patient also noted to have some borderline blood pressure/hypotension which improved with hydration.  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 Fall precautions. Patient was discharged to skilled nursing facility.  H/o bladder cancer h/o BPH Patient currently asymptomatic. Urinalysis with greater than 500 protein, trace leukocytes, nitrite negative, protein negative, 0-5 WBCs. Patient maintained on Flomax.   Chronic alcohol use:  Per admitting physician patient drinks 2 shots of bourbon daily, report has never had problem with intoxication or withdrawal. Patient not exhibiting any signs of withdrawal. Patient maintained on thiamine and folic acid.  Severe Protein calorie Malnutrition, Patient is seen by the dietitian. Continue nutritional supplementation. Body mass index is 20.27 kg/m. Patient started on nutritional supplementation.  Homeless situation:  patient was seen by physical therapy were recommending skilled nursing facility. Patient will be discharged to a skilled assistant facility.   Borderline blood pressure/hypertension Blood pressure improved on hydration. Patient remained  asymptomatic.    Procedures:  None  Consultations:  None  Discharge Exam: Vitals:   04/01/17 0504 04/01/17 1337  BP: 130/88 (!) 95/54  Pulse: 66 69  Resp: 18 20  Temp: 98 F (36.7 C) 98.1 F (36.7 C)  SpO2: 100% 97%    General: NAD Cardiovascular: RRR Respiratory: CTAB  Discharge Instructions   Discharge Instructions    Diet Carb Modified    Complete by:  As directed    Increase activity slowly    Complete by:  As directed      Current Discharge Medication List    START taking these medications   Details  feeding supplement, GLUCERNA SHAKE, (GLUCERNA SHAKE) LIQD Take 237 mLs by mouth 3 (three) times daily between meals. Refills: 0    folic acid (FOLVITE) 1 MG tablet Take 1 tablet (1 mg total) by mouth daily.    insulin aspart (NOVOLOG) 100 UNIT/ML injection Inject 3 Units into the skin 3 (three) times daily with meals. Qty: 10 mL, Refills: 0    insulin glargine (LANTUS) 100 UNIT/ML injection Inject 0.14 mLs (14 Units total) into the skin daily. Qty: 10 mL, Refills: 0    tamsulosin (FLOMAX) 0.4 MG CAPS capsule Take 1 capsule (0.4 mg total) by mouth daily. Qty: 30 capsule, Refills: 0    thiamine 100 MG tablet Take 1 tablet (100 mg total) by mouth daily.    Triamcinolone Acetonide (TRIAMCINOLONE 0.1 % CREAM : EUCERIN) CREA Apply 1 application topically 2 (two) times daily.      CONTINUE these medications which have NOT CHANGED  Details  aspirin EC 81 MG tablet Take 1 tablet (81 mg total) by mouth daily. Qty: 90 tablet, Refills: 3    cholecalciferol (VITAMIN D) 1000 UNITS tablet Take 4,000 Units by mouth daily.    cholestyramine (QUESTRAN) 4 g packet Take 1 packet (4 g total) by mouth daily. Qty: 30 each, Refills: 11    solifenacin (VESICARE) 10 MG tablet Take 10 mg by mouth daily.      STOP taking these medications     mirabegron ER (MYRBETRIQ) 50 MG TB24 tablet        No Known Allergies Contact information for after-discharge care     [facility] Follow up.   Specialty:  [place] information: [address] [place] [phone]               The results of significant diagnostics from this hospitalization (including imaging, microbiology, ancillary and laboratory) are listed below for reference.    Significant Diagnostic Studies: US Renal  Result Date: 03/28/2017 CLINICAL DATA:  Acute kidney injury EXAM: RENAL / URINARY TRACT ULTRASOUND COMPLETE COMPARISON:  08/21/2015 FINDINGS: Right Kidney: Length: 9.8 cm. Echogenicity within normal limits. No mass or hydronephrosis visualized. Echogenic focus in the lower pole measuring 6 mm, stone versus cortical calcification. Probable hypoechoic cyst upper pole right kidney measuring 0.8 x 0.8 x 0.8 cm. Left Kidney: Length: 10.2 cm. Echogenicity within normal limits. No mass or hydronephrosis visualized. Shadowing focus in the lower pole measuring 1.4 cm, probable stone. Re- demonstrated septated cyst in the upper pole measuring 3.5 x 3.1 x 3.6 cm, previously 3.4 x 3.3 x 3.2 cm. In the lower pole of the left kidney is a cyst measuring 6.2 x 6 x 6.2 cm, previously 4.6 x 5.6 x 5.1 cm. Bladder: Appears normal for degree of bladder distention. IMPRESSION: 1. Negative for hydronephrosis 2. Possible stones within both kidneys 3. 8 mm probable hypoechoic cyst right kidney 4. Stable septated cyst in the upper left kidney. Slight increase in size of a left lower pole cyst, now measuring 6.2 cm maximum Electronically Signed   By: Donavan Foil M.D.   On: 03/28/2017 18:37    Microbiology: No results found for this or any previous visit (from the past 240 hour(s)).   Labs: Basic Metabolic Panel:  Recent Labs Lab 03/28/17 1446 03/29/17 0437 03/30/17 0413 03/30/17 1224 03/31/17 0250 04/01/17 0531 04/01/17 1127  NA 135 136 137  --  136 137  --   K 4.9 4.0 4.6  --  4.0 4.0  --   CL 107 113* 111  --  109  110  --   CO2 17* 17* 18*  --  21* 22  --   GLUCOSE 455* 219* 368*  --  384* 192*  --   BUN 64* 47* 39*  --  32* 28*  --   CREATININE 1.91* 1.36* 1.42*  --  1.27* 1.15  --   CALCIUM 8.5* 7.9* 7.4*  --  7.5* 7.7*  --   MG  --   --   --  1.5* 2.3  --  1.6*   Liver Function Tests:  Recent Labs Lab 03/28/17 1446 03/29/17 0437  AST 17 14*  ALT 28 23  ALKPHOS 62 48  BILITOT 0.8 0.7  PROT 5.7* 4.8*  ALBUMIN 3.5 2.9*   No results for input(s): LIPASE, AMYLASE in the last 168 hours. No results for input(s): AMMONIA in the last  168 hours. CBC:  Recent Labs Lab 03/28/17 1446 03/29/17 0437 03/30/17 0413  WBC 14.3* 9.2 6.9  NEUTROABS 12.0*  --   --   HGB 12.1* 10.8* 10.4*  HCT 36.1* 32.5* 31.3*  MCV 98.9 98.5 99.7  PLT 156 141* 120*   Cardiac Enzymes: No results for input(s): CKTOTAL, CKMB, CKMBINDEX, TROPONINI in the last 168 hours. BNP: BNP (last 3 results) No results for input(s): BNP in the last 8760 hours.  ProBNP (last 3 results) No results for input(s): PROBNP in the last 8760 hours.  CBG:  Recent Labs Lab 03/31/17 1226 03/31/17 1713 03/31/17 2218 04/01/17 0815 04/01/17 1145  GLUCAP 207* 258* 154* 167* 165*       Signed:  THOMPSON,DANIEL MD.  [facility] 04/01/2017, 4:19 PM

## 2017-04-02 ENCOUNTER — Other Ambulatory Visit (HOSPITAL_COMMUNITY): Payer: Self-pay

## 2017-04-30 ENCOUNTER — Other Ambulatory Visit: Payer: Self-pay | Admitting: *Deleted

## 2017-04-30 DIAGNOSIS — E0800 Diabetes mellitus due to underlying condition with hyperosmolarity without nonketotic hyperglycemic-hyperosmolar coma (NKHHC): Secondary | ICD-10-CM

## 2017-04-30 NOTE — Patient Outreach (Signed)
[place] [facility]) Care Management  04/30/2017  Craig Whitehead 1927/08/19 [id number]   Met with [name], SW at facility, she states patient will discharge tomorrow. He refused all attempts at ALF, she states she had  ALF representatives visit patient while he was there, but he refused all places due to "affordability".  She states he has money and is still working a couple of days a week. Unsure if he would qualify for ALF if still working.  She states she gave patient list of extended stay hotels he could check into. She states patient has money, he does not have Medicaid.   Met with patient.  Patient confirms he is homeless, he will discharge tomorrow to his grandson's warehouse/storage unit where he has been living the past month. He reports he became homeless when he moved out of rental and his former landlord is not giving him a good reference for any new rentals he has attempted to find.  Patient confirms he works 2 days a week 2 hours per day (M,W) at [address] 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  Patient confirms he has a working cell phone, the number is [phone]  Patient reports that he was given a list of weekly stay hotels by SW at facility, but wants to find assistance for rent and find an apartment. He states he does not understand why he cannot get help with rent.  Patient mentions ALF, he states no one came by to facility regarding ALF even though SW said she had several ALF's visit and patient refused due to money.  Patient reports his 2 main concerns are being homeless and his blood sugars being "high", he reports he uses Product/process development scientist and is on Insulin. Patient states he has Medicaid in addition to Medicare but it is not on his record.   RNCM reviewed process of applying for subsidized  housing and unsure of process and procedure RNCM reviewed patient would need MD to complete an FL-2 for ALF placement, if he qualified  RNCM Reviewed [facility] care management. Patient states he needs all the help he can get. Consent obtained. RNCM left packet with RNCM contact, also put the information on weekly hotels in [facility] folder for patient reference.   Plan to refer to [facility] community care management.  Patient states to call him to set up an appointment to see where he will be, he states a possibility of meeting at the hair salon on [address].   Royetta Crochet. Laymond Purser, RN, BSN, [place] [phone]) Business Cell  [phone]) Toll Free Office

## 2017-05-02 ENCOUNTER — Other Ambulatory Visit: Payer: Self-pay | Admitting: *Deleted

## 2017-05-02 NOTE — Patient Outreach (Signed)
[place] [facility]) Care Management  05/02/2017  ELIAB CLOSSON December 24, 1927 [id number]   Referral received from post acute coordinator [facility]) for member as he was recently hospitalized and admitted to rehab facility.  He was admitted on 9/7 for complications of diabetes, discharged to facility on 9/11.  Per PAC, member was scheduled for discharge yesterday.  According to chart, he also has history of atrial fibrillation, congestive heart failure, and major depressive disorder.  Discharge complicated due to member being homeless.  He was however open to involvement with [facility].  Call placed to member, no answer.  HIPAA compliant voice message.  Will await call back, if no call back, will follow up next week.  Valente David, [place], MSN [place] [phone]

## 2017-05-05 ENCOUNTER — Other Ambulatory Visit: Payer: Self-pay | Admitting: *Deleted

## 2017-05-05 ENCOUNTER — Other Ambulatory Visit: Payer: Self-pay | Admitting: Licensed Clinical Social Worker

## 2017-05-05 NOTE — Patient Outreach (Signed)
[employer] [facility]) Care Management  05/05/2017  TYQUAVIOUS GAMEL 1927-09-23 [id number]   2nd attempt made to contact member for transition of care, no answer.  Unable to leave message as mailbox is full.  Will make 3rd attempt within the next 2 days.  Valente David, [place], MSN [place] [phone]

## 2017-05-05 NOTE — Patient Outreach (Signed)
[place] [facility]) Care Management  05/05/2017  Craig Whitehead July 02, 1928 [id number]  Assessment-CSW completed outreach initial attempt today after receiving referral for social work assistance. CSW unable to reach patient successfully. CSW left a HIPPA compliant voice message encouraging patient to return call once available.  Plan-CSW will await return call or complete an additional outreach if needed.  Eula Fried, BSW, MSW, [email or web address] Phone: [phone] Fax: [phone]

## 2017-05-07 ENCOUNTER — Other Ambulatory Visit: Payer: Self-pay | Admitting: Licensed Clinical Social Worker

## 2017-05-07 ENCOUNTER — Encounter: Payer: Self-pay | Admitting: *Deleted

## 2017-05-07 ENCOUNTER — Other Ambulatory Visit: Payer: Self-pay | Admitting: *Deleted

## 2017-05-07 NOTE — Patient Outreach (Signed)
[place] [facility]) Care Management  05/07/2017  ROBIE MCNIEL 16-Jan-1928 [id number]  Assessment-CSW completed second outreach attempt today. CSW unable to reach patient successfully. CSW left a HIPPA compliant voice message encouraging patient to return call once available.  Plan-CSW will await return call or complete third outreach within one week.  Eula Fried, BSW, MSW, [email or web address] Phone: [phone] Fax: [phone]

## 2017-05-07 NOTE — Patient Outreach (Signed)
[name]) Care Management  05/07/2017  Craig Whitehead 09/14/1927 [id number]   3rd attempt to contact member for transition of care, no answer.  HIPAA compliant voice message left.  Member is noted to be homeless, but does have PO box listed.  Will send outreach letter.  If no response in 10 days, will close case.   Valente David, [place], MSN [place] [phone]

## 2017-05-12 DIAGNOSIS — L299 Pruritus, unspecified: Secondary | ICD-10-CM | POA: Diagnosis not present

## 2017-05-12 DIAGNOSIS — J209 Acute bronchitis, unspecified: Secondary | ICD-10-CM | POA: Diagnosis not present

## 2017-05-14 ENCOUNTER — Other Ambulatory Visit: Payer: Self-pay | Admitting: Licensed Clinical Social Worker

## 2017-05-14 NOTE — Patient Outreach (Signed)
[place] [facility]) Care Management  05/14/2017  Craig Whitehead 1928-04-03 [id number]  Assessment-CSW completed third and final outreach attempt today. CSW unable to reach patient successfully. [facility] RNCM has mailed outreach barrier letter to patient.   Plan-CSW will await 10 business days to hear back before completing case closure.   Eula Fried, BSW, MSW, [email or web address] Phone: [phone] Fax: [phone]

## 2017-05-21 ENCOUNTER — Ambulatory Visit
Admission: RE | Admit: 2017-05-21 | Discharge: 2017-05-21 | Disposition: A | Discharge status: Home / Self Care | Payer: Medicare Other | Source: Ambulatory Visit | Attending: Internal Medicine | Admitting: Internal Medicine

## 2017-05-21 ENCOUNTER — Other Ambulatory Visit: Payer: Self-pay | Admitting: Internal Medicine

## 2017-05-21 DIAGNOSIS — R05 Cough: Secondary | ICD-10-CM

## 2017-05-21 DIAGNOSIS — R059 Cough, unspecified: Secondary | ICD-10-CM

## 2017-05-21 DIAGNOSIS — R0689 Other abnormalities of breathing: Secondary | ICD-10-CM | POA: Diagnosis not present

## 2017-05-22 ENCOUNTER — Encounter: Payer: Self-pay | Admitting: *Deleted

## 2017-05-22 ENCOUNTER — Other Ambulatory Visit: Payer: Self-pay | Admitting: Licensed Clinical Social Worker

## 2017-05-22 ENCOUNTER — Other Ambulatory Visit: Payer: Self-pay | Admitting: *Deleted

## 2017-05-22 NOTE — Patient Outreach (Signed)
[place] [facility]) Care Management  05/22/2017  Craig Whitehead 1928-06-12 [id number]  Assessment- [facility] RNCM and CSW will completing case closure at this time due to inability to establish contact with patient.   Plan-CSW will complete case closure at this time and notify [place] Management Assistant.  Eula Fried, BSW, MSW, [email or web address] Phone: [phone] Fax: [phone]

## 2017-05-22 NOTE — Patient Outreach (Signed)
[place] [facility]) Care Management  05/22/2017  Craig Whitehead 05/17/1928 [id number]   Outreach letter sent to member on 10/17, no response.  Will close case at this time.  Will notify care management assistant and primary MD.  LCSW updated.  Valente David, [place], MSN [place] [phone]

## 2017-06-10 DIAGNOSIS — I4891 Unspecified atrial fibrillation: Secondary | ICD-10-CM | POA: Diagnosis not present

## 2017-06-10 DIAGNOSIS — E1165 Type 2 diabetes mellitus with hyperglycemia: Secondary | ICD-10-CM | POA: Diagnosis not present

## 2017-06-10 DIAGNOSIS — R270 Ataxia, unspecified: Secondary | ICD-10-CM | POA: Diagnosis not present

## 2017-06-10 DIAGNOSIS — G914 Hydrocephalus in diseases classified elsewhere: Secondary | ICD-10-CM | POA: Diagnosis not present

## 2017-06-10 DIAGNOSIS — R159 Full incontinence of feces: Secondary | ICD-10-CM | POA: Diagnosis not present

## 2017-08-27 DIAGNOSIS — R159 Full incontinence of feces: Secondary | ICD-10-CM | POA: Diagnosis not present

## 2017-08-27 DIAGNOSIS — I4891 Unspecified atrial fibrillation: Secondary | ICD-10-CM | POA: Diagnosis not present

## 2017-08-27 DIAGNOSIS — E1165 Type 2 diabetes mellitus with hyperglycemia: Secondary | ICD-10-CM | POA: Diagnosis not present

## 2017-08-27 DIAGNOSIS — R270 Ataxia, unspecified: Secondary | ICD-10-CM | POA: Diagnosis not present

## 2017-08-27 DIAGNOSIS — G914 Hydrocephalus in diseases classified elsewhere: Secondary | ICD-10-CM | POA: Diagnosis not present

## 2017-08-27 DIAGNOSIS — Z794 Long term (current) use of insulin: Secondary | ICD-10-CM | POA: Diagnosis not present

## 2017-10-24 DIAGNOSIS — G914 Hydrocephalus in diseases classified elsewhere: Secondary | ICD-10-CM | POA: Diagnosis not present

## 2017-10-24 DIAGNOSIS — Z794 Long term (current) use of insulin: Secondary | ICD-10-CM | POA: Diagnosis not present

## 2017-10-24 DIAGNOSIS — I4891 Unspecified atrial fibrillation: Secondary | ICD-10-CM | POA: Diagnosis not present

## 2017-10-24 DIAGNOSIS — E1165 Type 2 diabetes mellitus with hyperglycemia: Secondary | ICD-10-CM | POA: Diagnosis not present

## 2017-10-24 DIAGNOSIS — B351 Tinea unguium: Secondary | ICD-10-CM | POA: Diagnosis not present

## 2017-10-24 DIAGNOSIS — R270 Ataxia, unspecified: Secondary | ICD-10-CM | POA: Diagnosis not present

## 2017-10-24 DIAGNOSIS — R159 Full incontinence of feces: Secondary | ICD-10-CM | POA: Diagnosis not present

## 2017-11-27 DIAGNOSIS — R270 Ataxia, unspecified: Secondary | ICD-10-CM | POA: Diagnosis not present

## 2017-11-27 DIAGNOSIS — G914 Hydrocephalus in diseases classified elsewhere: Secondary | ICD-10-CM | POA: Diagnosis not present

## 2017-11-27 DIAGNOSIS — M549 Dorsalgia, unspecified: Secondary | ICD-10-CM | POA: Diagnosis not present

## 2017-11-27 DIAGNOSIS — R159 Full incontinence of feces: Secondary | ICD-10-CM | POA: Diagnosis not present

## 2017-11-27 DIAGNOSIS — B351 Tinea unguium: Secondary | ICD-10-CM | POA: Diagnosis not present

## 2017-11-27 DIAGNOSIS — I4891 Unspecified atrial fibrillation: Secondary | ICD-10-CM | POA: Diagnosis not present

## 2017-11-27 DIAGNOSIS — E1165 Type 2 diabetes mellitus with hyperglycemia: Secondary | ICD-10-CM | POA: Diagnosis not present

## 2018-01-05 DIAGNOSIS — E1165 Type 2 diabetes mellitus with hyperglycemia: Secondary | ICD-10-CM | POA: Diagnosis not present

## 2018-01-05 DIAGNOSIS — M549 Dorsalgia, unspecified: Secondary | ICD-10-CM | POA: Diagnosis not present

## 2018-01-05 DIAGNOSIS — R159 Full incontinence of feces: Secondary | ICD-10-CM | POA: Diagnosis not present

## 2018-01-05 DIAGNOSIS — G914 Hydrocephalus in diseases classified elsewhere: Secondary | ICD-10-CM | POA: Diagnosis not present

## 2018-01-05 DIAGNOSIS — I4891 Unspecified atrial fibrillation: Secondary | ICD-10-CM | POA: Diagnosis not present

## 2018-01-05 DIAGNOSIS — R270 Ataxia, unspecified: Secondary | ICD-10-CM | POA: Diagnosis not present

## 2018-01-20 DIAGNOSIS — I4891 Unspecified atrial fibrillation: Secondary | ICD-10-CM | POA: Diagnosis not present

## 2018-01-20 DIAGNOSIS — R270 Ataxia, unspecified: Secondary | ICD-10-CM | POA: Diagnosis not present

## 2018-01-20 DIAGNOSIS — M549 Dorsalgia, unspecified: Secondary | ICD-10-CM | POA: Diagnosis not present

## 2018-01-20 DIAGNOSIS — G914 Hydrocephalus in diseases classified elsewhere: Secondary | ICD-10-CM | POA: Diagnosis not present

## 2018-01-20 DIAGNOSIS — R159 Full incontinence of feces: Secondary | ICD-10-CM | POA: Diagnosis not present

## 2018-01-20 DIAGNOSIS — E1165 Type 2 diabetes mellitus with hyperglycemia: Secondary | ICD-10-CM | POA: Diagnosis not present

## 2018-02-24 DIAGNOSIS — E1165 Type 2 diabetes mellitus with hyperglycemia: Secondary | ICD-10-CM | POA: Diagnosis not present

## 2018-02-24 DIAGNOSIS — M549 Dorsalgia, unspecified: Secondary | ICD-10-CM | POA: Diagnosis not present

## 2018-02-24 DIAGNOSIS — I4891 Unspecified atrial fibrillation: Secondary | ICD-10-CM | POA: Diagnosis not present

## 2018-02-24 DIAGNOSIS — R270 Ataxia, unspecified: Secondary | ICD-10-CM | POA: Diagnosis not present

## 2018-02-24 DIAGNOSIS — G914 Hydrocephalus in diseases classified elsewhere: Secondary | ICD-10-CM | POA: Diagnosis not present

## 2018-02-24 DIAGNOSIS — R159 Full incontinence of feces: Secondary | ICD-10-CM | POA: Diagnosis not present

## 2018-03-27 DIAGNOSIS — G914 Hydrocephalus in diseases classified elsewhere: Secondary | ICD-10-CM | POA: Diagnosis not present

## 2018-03-27 DIAGNOSIS — R270 Ataxia, unspecified: Secondary | ICD-10-CM | POA: Diagnosis not present

## 2018-03-27 DIAGNOSIS — I4891 Unspecified atrial fibrillation: Secondary | ICD-10-CM | POA: Diagnosis not present

## 2018-03-27 DIAGNOSIS — M549 Dorsalgia, unspecified: Secondary | ICD-10-CM | POA: Diagnosis not present

## 2018-03-27 DIAGNOSIS — R159 Full incontinence of feces: Secondary | ICD-10-CM | POA: Diagnosis not present

## 2018-03-27 DIAGNOSIS — E1165 Type 2 diabetes mellitus with hyperglycemia: Secondary | ICD-10-CM | POA: Diagnosis not present

## 2018-04-01 ENCOUNTER — Other Ambulatory Visit: Payer: Self-pay | Admitting: *Deleted

## 2018-04-01 NOTE — Patient Outreach (Signed)
[place] [facility]) Care Management  04/01/2018  Craig Whitehead 06-03-1928 [id number]   Telephone Screen  Referral Date: 03/27/18 Referral Source: MD referral - Josetta Huddle  Referral Reason:  In need of assisted living no medicaid needs assistance with medication Insurance: medicare    Outreach attempt #1 No answer. [facility] RN CM left HIPAA compliant voicemail message along with CM's contact info.   Plan: [facility] RN CM sent an unsuccessful outreach letter and scheduled this patient for another call attempt within 4 business days  Kaislyn Gulas L. Lavina Hamman, RN, BSN, [place] Coordinator Office number [phone] Mobile number [phone]  Main [facility] number [phone] Fax number [phone]

## 2018-04-02 ENCOUNTER — Ambulatory Visit: Payer: Self-pay | Admitting: *Deleted

## 2018-04-03 ENCOUNTER — Ambulatory Visit: Payer: Self-pay | Admitting: *Deleted

## 2018-04-07 ENCOUNTER — Ambulatory Visit: Payer: Self-pay | Admitting: *Deleted

## 2018-04-08 ENCOUNTER — Other Ambulatory Visit: Payer: Self-pay | Admitting: *Deleted

## 2018-04-08 NOTE — Patient Outreach (Signed)
[place] [facility]) Care Management  04/08/2018  Craig Whitehead 1927/11/28 [id number]   Telephone Screen  Referral Date: 03/27/18 Referral Source: MD referral - Josetta Huddle  Referral Reason:  In need of assisted living no medicaid needs assistance with medication Insurance: medicare    Outreach attempt #2 No answer. [facility] RN CM left HIPAA compliant voicemail message along with CM's contact info.  [facility] RN CM spoke with his brother and left a HIPPA compliant message along with CM's contact info  Plan: [facility] RN CM  scheduled this patient for a third call attempt within 4 business days  Cleophus Mendonsa L. Lavina Hamman, RN, BSN, [place] Coordinator Office number [phone] Mobile number [phone]  Main [facility] number [phone] Fax number [phone]

## 2018-04-13 ENCOUNTER — Ambulatory Visit: Payer: Self-pay | Admitting: *Deleted

## 2018-04-14 ENCOUNTER — Other Ambulatory Visit: Payer: Self-pay | Admitting: *Deleted

## 2018-04-15 ENCOUNTER — Other Ambulatory Visit: Payer: Self-pay | Admitting: *Deleted

## 2018-04-15 NOTE — Patient Outreach (Addendum)
[place] [facility]) Care Management  04/15/2018  Craig Whitehead 1927-10-25 [id number]   Telephone Screen  Referral Date:03/27/18 Referral Source:MD referral - Craig Whitehead Referral Reason:In need of assisted living no medicaid needs assistance with medication Insurance:medicare   Outreach attempt #3 Craig Whitehead reached but with noted telephone audio difficulties.  He answered a few assessment questions, questioned the contact, refused to answer further questions and disconnected from CM    Plans case closure if no return call from patient  Joelene Millin L. Lavina Hamman, RN, BSN, [place] Coordinator Office number [phone] Mobile number [phone]  Main [facility] number [phone] Fax number [phone]'

## 2018-04-27 DIAGNOSIS — E1165 Type 2 diabetes mellitus with hyperglycemia: Secondary | ICD-10-CM | POA: Diagnosis not present

## 2018-04-27 DIAGNOSIS — R627 Adult failure to thrive: Secondary | ICD-10-CM | POA: Diagnosis not present

## 2018-04-27 DIAGNOSIS — M549 Dorsalgia, unspecified: Secondary | ICD-10-CM | POA: Diagnosis not present

## 2018-04-27 DIAGNOSIS — I4891 Unspecified atrial fibrillation: Secondary | ICD-10-CM | POA: Diagnosis not present

## 2018-04-27 DIAGNOSIS — G914 Hydrocephalus in diseases classified elsewhere: Secondary | ICD-10-CM | POA: Diagnosis not present

## 2018-04-27 DIAGNOSIS — R270 Ataxia, unspecified: Secondary | ICD-10-CM | POA: Diagnosis not present

## 2018-04-27 DIAGNOSIS — R159 Full incontinence of feces: Secondary | ICD-10-CM | POA: Diagnosis not present

## 2018-06-16 DIAGNOSIS — M549 Dorsalgia, unspecified: Secondary | ICD-10-CM | POA: Diagnosis not present

## 2018-06-16 DIAGNOSIS — I4891 Unspecified atrial fibrillation: Secondary | ICD-10-CM | POA: Diagnosis not present

## 2018-06-16 DIAGNOSIS — E1165 Type 2 diabetes mellitus with hyperglycemia: Secondary | ICD-10-CM | POA: Diagnosis not present

## 2018-06-16 DIAGNOSIS — D649 Anemia, unspecified: Secondary | ICD-10-CM | POA: Diagnosis not present

## 2018-06-16 DIAGNOSIS — R627 Adult failure to thrive: Secondary | ICD-10-CM | POA: Diagnosis not present

## 2018-06-16 DIAGNOSIS — R270 Ataxia, unspecified: Secondary | ICD-10-CM | POA: Diagnosis not present

## 2018-06-16 DIAGNOSIS — R159 Full incontinence of feces: Secondary | ICD-10-CM | POA: Diagnosis not present

## 2018-06-16 DIAGNOSIS — G914 Hydrocephalus in diseases classified elsewhere: Secondary | ICD-10-CM | POA: Diagnosis not present

## 2018-07-03 DIAGNOSIS — E538 Deficiency of other specified B group vitamins: Secondary | ICD-10-CM | POA: Diagnosis not present

## 2018-07-10 DIAGNOSIS — I4891 Unspecified atrial fibrillation: Secondary | ICD-10-CM | POA: Diagnosis not present

## 2018-07-10 DIAGNOSIS — G914 Hydrocephalus in diseases classified elsewhere: Secondary | ICD-10-CM | POA: Diagnosis not present

## 2018-07-10 DIAGNOSIS — E538 Deficiency of other specified B group vitamins: Secondary | ICD-10-CM | POA: Diagnosis not present

## 2018-07-10 DIAGNOSIS — R627 Adult failure to thrive: Secondary | ICD-10-CM | POA: Diagnosis not present

## 2018-07-10 DIAGNOSIS — D649 Anemia, unspecified: Secondary | ICD-10-CM | POA: Diagnosis not present

## 2018-07-10 DIAGNOSIS — M549 Dorsalgia, unspecified: Secondary | ICD-10-CM | POA: Diagnosis not present

## 2018-07-10 DIAGNOSIS — E1165 Type 2 diabetes mellitus with hyperglycemia: Secondary | ICD-10-CM | POA: Diagnosis not present

## 2018-07-10 DIAGNOSIS — R159 Full incontinence of feces: Secondary | ICD-10-CM | POA: Diagnosis not present

## 2018-07-10 DIAGNOSIS — R270 Ataxia, unspecified: Secondary | ICD-10-CM | POA: Diagnosis not present

## 2018-07-20 ENCOUNTER — Emergency Department (HOSPITAL_COMMUNITY)
Admission: EM | Admit: 2018-07-20 | Discharge: 2018-07-20 | Disposition: A | Discharge status: Home / Self Care | Payer: Medicare Other | Attending: Emergency Medicine | Admitting: Emergency Medicine

## 2018-07-20 ENCOUNTER — Emergency Department (HOSPITAL_COMMUNITY): Payer: Medicare Other

## 2018-07-20 ENCOUNTER — Encounter (HOSPITAL_COMMUNITY): Payer: Self-pay

## 2018-07-20 ENCOUNTER — Other Ambulatory Visit: Payer: Self-pay

## 2018-07-20 DIAGNOSIS — S3992XA Unspecified injury of lower back, initial encounter: Secondary | ICD-10-CM | POA: Diagnosis not present

## 2018-07-20 DIAGNOSIS — Z8551 Personal history of malignant neoplasm of bladder: Secondary | ICD-10-CM | POA: Insufficient documentation

## 2018-07-20 DIAGNOSIS — I5022 Chronic systolic (congestive) heart failure: Secondary | ICD-10-CM | POA: Insufficient documentation

## 2018-07-20 DIAGNOSIS — I110 Hypertensive heart disease with heart failure: Secondary | ICD-10-CM | POA: Insufficient documentation

## 2018-07-20 DIAGNOSIS — E1165 Type 2 diabetes mellitus with hyperglycemia: Secondary | ICD-10-CM | POA: Diagnosis not present

## 2018-07-20 DIAGNOSIS — S79912A Unspecified injury of left hip, initial encounter: Secondary | ICD-10-CM | POA: Diagnosis not present

## 2018-07-20 DIAGNOSIS — R5381 Other malaise: Secondary | ICD-10-CM | POA: Diagnosis not present

## 2018-07-20 DIAGNOSIS — Z87891 Personal history of nicotine dependence: Secondary | ICD-10-CM | POA: Insufficient documentation

## 2018-07-20 DIAGNOSIS — R55 Syncope and collapse: Secondary | ICD-10-CM | POA: Insufficient documentation

## 2018-07-20 DIAGNOSIS — M545 Low back pain, unspecified: Secondary | ICD-10-CM

## 2018-07-20 DIAGNOSIS — I10 Essential (primary) hypertension: Secondary | ICD-10-CM | POA: Diagnosis not present

## 2018-07-20 DIAGNOSIS — Z79899 Other long term (current) drug therapy: Secondary | ICD-10-CM | POA: Insufficient documentation

## 2018-07-20 DIAGNOSIS — Z794 Long term (current) use of insulin: Secondary | ICD-10-CM | POA: Insufficient documentation

## 2018-07-20 DIAGNOSIS — I4891 Unspecified atrial fibrillation: Secondary | ICD-10-CM | POA: Diagnosis not present

## 2018-07-20 DIAGNOSIS — W19XXXA Unspecified fall, initial encounter: Secondary | ICD-10-CM

## 2018-07-20 DIAGNOSIS — R296 Repeated falls: Secondary | ICD-10-CM | POA: Diagnosis not present

## 2018-07-20 DIAGNOSIS — M25552 Pain in left hip: Secondary | ICD-10-CM | POA: Diagnosis not present

## 2018-07-20 DIAGNOSIS — Z7982 Long term (current) use of aspirin: Secondary | ICD-10-CM | POA: Diagnosis not present

## 2018-07-20 LAB — CBC WITH DIFFERENTIAL/PLATELET
ABS IMMATURE GRANULOCYTES: 0.02 10*3/uL (ref 0.00–0.07)
BASOS ABS: 0 10*3/uL (ref 0.0–0.1)
Basophils Relative: 1 %
EOS PCT: 2 %
Eosinophils Absolute: 0.1 10*3/uL (ref 0.0–0.5)
HCT: 31.7 % — ABNORMAL LOW (ref 39.0–52.0)
HEMOGLOBIN: 10 g/dL — AB (ref 13.0–17.0)
Immature Granulocytes: 0 %
LYMPHS PCT: 24 %
Lymphs Abs: 1.3 10*3/uL (ref 0.7–4.0)
MCH: 34.1 pg — ABNORMAL HIGH (ref 26.0–34.0)
MCHC: 31.5 g/dL (ref 30.0–36.0)
MCV: 108.2 fL — ABNORMAL HIGH (ref 80.0–100.0)
Monocytes Absolute: 0.4 10*3/uL (ref 0.1–1.0)
Monocytes Relative: 8 %
NEUTROS ABS: 3.5 10*3/uL (ref 1.7–7.7)
NRBC: 0 % (ref 0.0–0.2)
Neutrophils Relative %: 65 %
Platelets: 215 10*3/uL (ref 150–400)
RBC: 2.93 MIL/uL — AB (ref 4.22–5.81)
RDW: 13.7 % (ref 11.5–15.5)
WBC: 5.3 10*3/uL (ref 4.0–10.5)

## 2018-07-20 LAB — URINALYSIS, ROUTINE W REFLEX MICROSCOPIC
BILIRUBIN URINE: NEGATIVE
Bacteria, UA: NONE SEEN
Glucose, UA: NEGATIVE mg/dL
Ketones, ur: NEGATIVE mg/dL
LEUKOCYTES UA: NEGATIVE
Nitrite: NEGATIVE
PROTEIN: NEGATIVE mg/dL
Specific Gravity, Urine: 1.011 (ref 1.005–1.030)
pH: 5 (ref 5.0–8.0)

## 2018-07-20 LAB — BASIC METABOLIC PANEL
Anion gap: 9 (ref 5–15)
BUN: 25 mg/dL — ABNORMAL HIGH (ref 8–23)
CHLORIDE: 111 mmol/L (ref 98–111)
CO2: 20 mmol/L — ABNORMAL LOW (ref 22–32)
Calcium: 8.5 mg/dL — ABNORMAL LOW (ref 8.9–10.3)
Creatinine, Ser: 1.73 mg/dL — ABNORMAL HIGH (ref 0.61–1.24)
GFR, EST AFRICAN AMERICAN: 40 mL/min — AB (ref >60)
GFR, EST NON AFRICAN AMERICAN: 34 mL/min — AB (ref >60)
Glucose, Bld: 175 mg/dL — ABNORMAL HIGH (ref 70–99)
Potassium: 4.3 mmol/L (ref 3.5–5.1)
SODIUM: 140 mmol/L (ref 135–145)

## 2018-07-20 LAB — I-STAT TROPONIN, ED: Troponin i, poc: 0.02 ng/mL (ref 0.00–0.08)

## 2018-07-20 LAB — I-STAT CG4 LACTIC ACID, ED: LACTIC ACID, VENOUS: 0.84 mmol/L (ref 0.5–1.9)

## 2018-07-20 MED ORDER — MORPHINE SULFATE (PF) 4 MG/ML IV SOLN
4.0000 mg | Freq: Once | INTRAVENOUS | Status: AC
  Administered 2018-07-20: 4 mg via INTRAVENOUS
  Filled 2018-07-20: qty 1

## 2018-07-20 MED ORDER — SODIUM CHLORIDE 0.9 % IV BOLUS
500.0000 mL | Freq: Once | INTRAVENOUS | Status: AC
  Administered 2018-07-20: 500 mL via INTRAVENOUS

## 2018-07-20 MED ORDER — LIDOCAINE 5 % EX PTCH: 1.0000 | MEDICATED_PATCH | CUTANEOUS | 0 refills | Status: DC

## 2018-07-20 NOTE — ED Notes (Signed)
Patient verbalizes understanding of discharge instructions. Opportunity for questioning and answers were provided. Armband removed by staff, pt discharged from ED via wheelchair w/ daughter  

## 2018-07-20 NOTE — ED Triage Notes (Signed)
Pt presents from home for evaluation of increased falls since July. No new injuries. Pt is AxO x4. 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

## 2018-07-20 NOTE — Discharge Instructions (Signed)
You can take 1000 mg of Tylenol.  Do not exceed 4000 mg of Tylenol a day.  Use lidocaine patch as directed for pain.  As you discussed, your creatinine today was slightly high.  Please have your primary care doctor follow this up.  Make sure you are using your walker when you are walking.  Return the emergency department for any chest pain, difficulty breathing, numbness/weakness of your arms or legs or any other worsening or concerning symptoms.

## 2018-07-20 NOTE — ED Provider Notes (Signed)
[facility] EMERGENCY DEPARTMENT Provider Note   CSN: [id number] Arrival date & time: 07/20/18  1420     History   Chief Complaint Chief Complaint  Patient presents with  . Fall    HPI Craig Whitehead is a 84 y.o. male with past medical history of anxiety, A. fib, BPH, diabetes, GERD, NPH with shunts placed who presents for evaluation of multiple falls over the last week.  Daughter states that about a week ago, patient was at her house and states that he was walking from the bathroom to the kitchen when he fell and landed on a table.  She reports he hit his lower back on the table.  He did not hit his head.  She reports that he has been able to ambulate and bear weight since that fall.  She states that the next day, patient was trying to alleviate some back pain so he used a teeter inversion table to help with his back pain.  He reports that it got stuck upside down and he had to call his daughter to come help him.  Patient also reports that he had a fall this morning.  He states he was trying to get out of bed and was getting up from a supine position when he fell off balance and caused him to fall.  He states he did not hit his head or have any LOC.  Patient reports that he has had lower back pain and left hip pain since the fall a week ago.  He has been able to ambulate and put weight on the leg but does report worsening pain with doing so.  Patient states that he does not have any prior chest pain or dizziness prior to his falls.  Patient states that he does have a history of diabetes but states he has been taking his medications as directed.  Patient denies any vision changes, chest pain, difficulty breathing, nausea/vomiting, abdominal pain, numbness/weakness of his arms or legs.  The history is provided by the patient.    Past Medical History:  Diagnosis Date  . Anxiety   . Atrial fibrillation ([place])   . Bladder cancer ([place])   . BPH (benign prostatic hypertrophy)    . Diabetes mellitus ([place])   . Diarrhea   . Difficult intubation   . ED (erectile dysfunction)   . GERD (gastroesophageal reflux disease)   . Hypercholesteremia   . Hypertension   . NPH (normal pressure hydrocephalus) ([place])   . Seborrheic dermatitis   . Vitamin D deficiency     Patient Active Problem List   Diagnosis Date Noted  . Pressure injury of skin 04/01/2017  . Protein-calorie malnutrition, severe 03/29/2017  . Hyperglycemia due to type 2 diabetes mellitus ([place]) 03/29/2017  . Acute kidney injury superimposed on CKD ([place]) 03/29/2017  . Leukocytosis 03/29/2017  . Chronic systolic CHF (congestive heart failure) ([place]) 03/29/2017  . NPH (normal pressure hydrocephalus) ([place]) 03/29/2017  . BPH (benign prostatic hyperplasia) 03/29/2017  . History of bladder cancer 03/29/2017  . Homelessness 03/29/2017  . AKI (acute kidney injury) ([place])   . Chronic atrial fibrillation   . Weakness 03/28/2017  . Persistent atrial fibrillation ([place]): CHA2DS2Vasc 2 (age). Rate controlled without medications asymptomatic 06/02/2016  . Medication management 05/31/2016  . Major depressive disorder, recurrent severe without psychotic features ([place]) 05/13/2016    Past Surgical History:  Procedure Laterality Date  . BLADDER SURGERY     x 4 for bladder cancer  . BRAIN  SURGERY    . CATARACT EXTRACTION Bilateral   . INGUINAL HERNIA REPAIR Bilateral   . TRANSTHORACIC ECHOCARDIOGRAM  05/2016   [facility]: EF 45-50%. Mildly reduced function (likely related to A. fib). Marketed LA dilation. Moderate RA dilation. Aortic sclerosis without stenosis. Mild-moderate TR. Mild-moderate pulmonary tension. Small pericardial effusion versus pericardial fat  . VENTRICULOPERITONEAL SHUNT  2002        Home Medications    Prior to Admission medications   Medication Sig Start Date End Date Taking? Authorizing Provider  aspirin EC 81 MG tablet Take 1 tablet (81 mg total) by mouth daily. 06/27/16    Leonie Man, MD  cholecalciferol (VITAMIN D) 1000 UNITS tablet Take 4,000 Units by mouth daily.    [provider]  cholestyramine (QUESTRAN) 4 g packet Take 1 packet (4 g total) by mouth daily. 08/06/16   Ladene Artist, MD  feeding supplement, GLUCERNA SHAKE, (GLUCERNA SHAKE) LIQD Take 237 mLs by mouth 3 (three) times daily between meals. 04/01/17   Eugenie Filler, MD  folic acid (FOLVITE) 1 MG tablet Take 1 tablet (1 mg total) by mouth daily. 04/02/17   Eugenie Filler, MD  insulin aspart (NOVOLOG) 100 UNIT/ML injection Inject 3 Units into the skin 3 (three) times daily with meals. 04/01/17   Eugenie Filler, MD  insulin glargine (LANTUS) 100 UNIT/ML injection Inject 0.14 mLs (14 Units total) into the skin daily. 04/02/17   Eugenie Filler, MD  lidocaine (LIDODERM) 5 % Place 1 patch onto the skin daily. Remove & Discard patch within 12 hours or as directed by MD 07/20/18   Volanda Napoleon, PA-C  solifenacin (VESICARE) 10 MG tablet Take 10 mg by mouth daily.    [provider]  tamsulosin (FLOMAX) 0.4 MG CAPS capsule Take 1 capsule (0.4 mg total) by mouth daily. 04/02/17   Eugenie Filler, MD  thiamine 100 MG tablet Take 1 tablet (100 mg total) by mouth daily. 04/02/17   Eugenie Filler, MD  Triamcinolone Acetonide (TRIAMCINOLONE 0.1 % CREAM : EUCERIN) CREA Apply 1 application topically 2 (two) times daily. 04/01/17   Eugenie Filler, MD    Family History Family History  Problem Relation Age of Onset  . Rheum arthritis Mother   . Cancer Father     Social History Social History   Tobacco Use  . Smoking status: Former Smoker    Last attempt to quit: 07/06/1994    Years since quitting: 24.0  . Smokeless tobacco: Never Used  Substance Use Topics  . Alcohol use: Yes    Alcohol/week: 1.0 standard drinks    Types: 1 Shots of liquor per week    Comment: 1 bourbon daily  . Drug use: No     Allergies   Patient has no known allergies.   Review  of Systems Review of Systems  Constitutional: Negative for fever.  Eyes: Negative for visual disturbance.  Respiratory: Negative for cough and shortness of breath.   Cardiovascular: Negative for chest pain.  Gastrointestinal: Negative for abdominal pain, nausea and vomiting.  Genitourinary: Negative for dysuria and hematuria.  Musculoskeletal: Positive for back pain.       Left hip pain  Neurological: Negative for headaches.  All other systems reviewed and are negative.    Physical Exam Updated Vital Signs BP 123/81   Pulse 75   Temp 98.5 F (36.9 C) (Oral)   Resp 16   SpO2 100%   Physical Exam Vitals  signs and nursing note reviewed.  Constitutional:      Appearance: Normal appearance. He is well-developed.  HENT:     Head: Normocephalic and atraumatic.     Comments: No tenderness to palpation of skull. No deformities or crepitus noted. No open wounds, abrasions or lacerations.  Eyes:     General: Lids are normal.     Conjunctiva/sclera: Conjunctivae normal.     Pupils: Pupils are equal, round, and reactive to light.  Neck:     Musculoskeletal: Full passive range of motion without pain.     Comments: Full flexion/extension and lateral movement of neck fully intact. No bony midline tenderness. No deformities or crepitus.  Cardiovascular:     Rate and Rhythm: Normal rate and regular rhythm.     Pulses: Normal pulses.     Heart sounds: Normal heart sounds. No murmur. No friction rub. No gallop.   Pulmonary:     Effort: Pulmonary effort is normal.     Breath sounds: Normal breath sounds.     Comments: Lungs clear to auscultation bilaterally.  Symmetric chest rise.  No wheezing, rales, rhonchi. Abdominal:     Palpations: Abdomen is soft. Abdomen is not rigid.     Tenderness: There is no abdominal tenderness. There is no guarding.     Comments: Abdomen is soft, non-distended, non-tender. No rigidity, No guarding. No peritoneal signs.  Musculoskeletal: Normal range of  motion.     Thoracic back: He exhibits no tenderness.     Lumbar back: He exhibits tenderness.       Back:     Comments: No midline T-spine tenderness.  No deformity or crepitus noted.  Tenderness palpation noted in the L-spine that extends down into the gluteal region.  Tenderness palpation noted to left hip.  No deformity or crepitus noted.  Flexion/extension of left lower extremity intact.  He does have some pain with internal rotation of the left lower hip.  No pain with external rotation.  No tenderness palpation of the right lower extremity.  Full range of motion of right lower extremity intact without any difficulty.  No shortening or rotation noted of left lower extremity.  Skin:    General: Skin is warm and dry.     Capillary Refill: Capillary refill takes less than 2 seconds.  Neurological:     Mental Status: He is alert and oriented to person, place, and time.     Comments: Cranial nerves III-XII intact Follows commands, Moves all extremities  5/5 strength to BUE and BLE  Sensation intact throughout all major nerve distributions Normal finger to nose. No dysdiadochokinesia. No pronator drift. No gait ataxia No slurred speech. No facial droop.   Psychiatric:        Speech: Speech normal.      ED Treatments / Results  Labs (all labs ordered are listed, but only abnormal results are displayed) Labs Reviewed  BASIC METABOLIC PANEL - Abnormal; Notable for the following components:      Result Value   CO2 20 (*)    Glucose, Bld 175 (*)    BUN 25 (*)    Creatinine, Ser 1.73 (*)    Calcium 8.5 (*)    GFR calc non Af Amer 34 (*)    GFR calc Af Amer 40 (*)    All other components within normal limits  CBC WITH DIFFERENTIAL/PLATELET - Abnormal; Notable for the following components:   RBC 2.93 (*)    Hemoglobin 10.0 (*)    HCT  31.7 (*)    MCV 108.2 (*)    MCH 34.1 (*)    All other components within normal limits  URINALYSIS, ROUTINE W REFLEX MICROSCOPIC - Abnormal;  Notable for the following components:   Color, Urine STRAW (*)    Hgb urine dipstick SMALL (*)    All other components within normal limits  I-STAT TROPONIN, ED  I-STAT CG4 LACTIC ACID, ED    EKG EKG Interpretation  Date/Time:  Monday July 20 2018 14:33:34 EST Ventricular Rate:  89 PR Interval:    QRS Duration: 103 QT Interval:  393 QTC Calculation: 442 R Axis:   -29 Text Interpretation:  Atrial fibrillation Ventricular premature complex Borderline left axis deviation When comapred to prior, similar afib.  No STEMI Confirmed by Antony Blackbird [phone]) on 07/20/2018 2:45:46 PM Also confirmed by Antony Blackbird [phone]), editor Philomena Doheny [phone])  on 07/20/2018 3:58:05 PM   Radiology Dg Lumbar Spine Complete  Result Date: 07/20/2018 CLINICAL DATA:  Patient fell onto his back recently and has been having back pain from T8 inferiorly. The patient has a burning sensation when he assumes certain positions. Patient also reports severe left hip pain with movement. EXAM: LUMBAR SPINE - COMPLETE 4+ VIEW COMPARISON:  KUB of Dec 05, 2016 FINDINGS: The images are technically limited. The lumbar vertebral bodies are preserved in height. There is congenital partial fusion across the L1-2 disc space. There is mild disc space narrowing at L2-3, L3-4, and L5 L4-5. There is no spondylolisthesis. There is mild facet joint hypertrophy at L3-4, L4-5, and L5-S1. As best as can be determined the pedicles are intact. A ventriculoperitoneal shunt tube is visible. IMPRESSION: Multilevel degenerative disc disease of the lumbar spine without compression fracture or spondylolisthesis. Mild facet joint hypertrophy from L3 inferiorly. Given the patient's complaints referable to approximately T8 inferiorly, a thoracic spine series is recommended to exclude thoracic compression fractures. Electronically Signed   By: David  Martinique M.D.   On: 07/20/2018 16:11   Ct Head Wo Contrast  Result Date: 07/20/2018 CLINICAL  DATA:  Recurrent falls. EXAM: CT HEAD WITHOUT CONTRAST TECHNIQUE: Contiguous axial images were obtained from the base of the skull through the vertex without intravenous contrast. COMPARISON:  CT head dated Dec 05, 2016. FINDINGS: Brain: No evidence of acute infarction, hemorrhage, hydrocephalus, extra-axial collection or mass lesion/mass effect. Unchanged right frontal approach VP shunt catheter. Unchanged right frontal gliosis along the shunt catheter. Stable generalized atrophy. Vascular: Atherosclerotic vascular calcification of the carotid siphons. No hyperdense vessel. Skull: Normal. Negative for fracture or focal lesion. Sinuses/Orbits: No acute finding. Other: None. IMPRESSION: 1.  No acute intracranial abnormality.  Stable VP shunt. Electronically Signed   By: Titus Dubin M.D.   On: 07/20/2018 16:24   Dg Hip Unilat W Or Wo Pelvis 2-3 Views Left  Result Date: 07/20/2018 CLINICAL DATA:  The patient reports falling onto is back recently. Patient reports severe left hip pain with movement. EXAM: DG HIP (WITH OR WITHOUT PELVIS) 2-3V LEFT COMPARISON:  KUB of Dec 05, 2016 which included portions of the left hip. FINDINGS: The bones are subjectively osteopenic. No lytic or blastic bony lesion of the pelvis is observed. AP and lateral views of the left hip reveal preservation of the joint space. The articular surfaces of the left femoral head and acetabulum remains smoothly rounded. The femoral neck, intertrochanteric, and subtrochanteric regions reveal no acute abnormalities. IMPRESSION: No acute fracture of the left hip is observed. No significant degenerative change of the left hip  is demonstrated. Electronically Signed   By: David  Martinique M.D.   On: 07/20/2018 16:12    Procedures Procedures (including critical care time)  Medications Ordered in ED Medications  sodium chloride 0.9 % bolus 500 mL (0 mLs Intravenous Stopped 07/20/18 1803)  morphine 4 MG/ML injection 4 mg (4 mg Intravenous Given  07/20/18 1702)     Initial Impression / Assessment and Plan / ED Course  I have reviewed the triage vital signs and the nursing notes.  Pertinent labs & imaging results that were available during my care of the patient were reviewed by me and considered in my medical decision making (see chart for details).     84 year old male who presents for evaluation of fall that occurred this morning while getting out of bed.  No preceding chest pain or dizziness.  Daughter reports that he had another fall about a week ago.  Patient states that he has been able to ambulate but does report some balance issues.  He states that he normally ambulates with a cane or walker but has not been using it.  No chest pain because of dizziness.  He does have a history of VP shunt but states that he had it reevaluated earlier this year with no abnormalities.  He does have a history of A. fib but is not currently on anticoagulation. Patient is afebrile, non-toxic appearing, sitting comfortably on examination table. Vital signs reviewed and stable. No neuro deficits noted on exam.  Patient with tenderness palpation noted to left lower back as well as left hip.  We will plan for x-rays.  Additionally, will plan for CT scan of head for evaluation of VP shunt.  Lactic acid negative.  BMP shows bicarb of 20, BUN of 25 and creatinine of 1.73.  This is slightly elevated from his previous.  Baseline creatinine is normally around 1.2-1.4.  CBC shows no leukocytosis.  Hemoglobin is 10.0.  This is consistent with patient's baseline.  Trop negative.  UA negative for any acute infectious etiology.  CT head negative for any acute abnormality.  VP shunt stable.  Hip x-ray negative for any acute bony abnormality.  Lumbar x-ray shows degenerative disc disease of lumbar spine.  No obvious compression fracture spondylolisthesis.  There is mention about getting a dedicated thoracic spine series.  Patient exhibits no tenderness noted to the  thoracic T-spine.  All of his tenderness is noted to the lower lumbar region.  Orthostatic VS for the past 24 hrs:  BP- Lying Pulse- Lying BP- Sitting Pulse- Sitting BP- Standing at 0 minutes Pulse- Standing at 0 minutes  07/20/18 1712 - - 152/80 83 140/83 87  07/20/18 1657 (!) 141/97 85 - - - -    Discussed results with patient.  Patient reports improvement in pain after analgesics here in the ED.  Patient able to ambulate in the department with assistance.  He did not display any gait ataxia.  Given that he is able to bear weight and ambulate on leg, do not feel that CT abdomen pelvis is indicated for evaluation of occult fracture.  He states that he felt okay walking.  Patient is supposed be using a walker but does endorse that he has not been using the walker lately.  Suspect that this may be contributing to his falls.  I discussed at length with both patient and daughter about evaluation of PT/OT and home health services for evaluation of mobility issues.  Daughter states that where he is currently living is not  suitable for evaluation and he is getting ready to be affected on 07/26/18.  Discussed with daughter and patient that this still may be a good idea for evaluation to help with mobility aids.  At this time, do not believe the cause of patient symptoms be posterior circulation stroke, ACS etiology. At this time, patient exhibits no emergent life-threatening condition that require further evaluation in ED or admission. Discussed patient with Dr. Ashok Cordia who agrees with plan. Patient had ample opportunity for questions and discussion. All patient's questions were answered with full understanding. Strict return precautions discussed. Patient expresses understanding and agreement to plan.   Portions of this note were generated with Lobbyist. Dictation errors may occur despite best attempts at proofreading.    Final Clinical Impressions(s) / ED Diagnoses   Final diagnoses:    Fall, initial encounter  Acute left-sided low back pain, unspecified whether sciatica present    ED Discharge Orders         Ordered    lidocaine (LIDODERM) 5 %  Every 24 hours     07/20/18 [place]     07/20/18 1823    Face-to-face encounter (required for Medicare/Medicaid patients)    Comments:  I Volanda Napoleon certify that this patient is under my care and that I, or a nurse practitioner or physician's assistant working with me, had a face-to-face encounter that meets the physician face-to-face encounter requirements with this patient on 07/20/2018. The encounter with the patient was in whole, or in part for the following medical condition(s) which is the primary reason for home health care (List medical condition): Fall   07/20/18 1823           Volanda Napoleon, PA-C 07/20/18 2201    Tegeler, Gwenyth Allegra, MD 07/21/18 [phone]

## 2018-07-23 ENCOUNTER — Other Ambulatory Visit: Payer: Self-pay | Admitting: *Deleted

## 2018-07-23 NOTE — Patient Outreach (Signed)
[facility]) Care Management  07/23/2018  BEAUREGARD JARRELLS 21-Dec-1927 [id number]   Telephone Screen  Referral Date:03/27/18 Referral Source:MD referral - Josetta Huddle Referral Reason:In need of assisted living no medicaid needs assistance with medication Insurance:medicare   Outreach attempt #3 No answer. [facility] RN CM left HIPAA compliant voicemail message along with CM's contact info.   Plan: [facility] RN CM  scheduled this patient for case closure within 4 business days  Sondi Desch L. Lavina Hamman, RN, BSN, [place] Coordinator Office number [phone] Mobile number [phone]  Main [facility] number [phone] Fax number [phone]

## 2018-07-25 DIAGNOSIS — I4891 Unspecified atrial fibrillation: Secondary | ICD-10-CM | POA: Diagnosis not present

## 2018-07-25 DIAGNOSIS — R159 Full incontinence of feces: Secondary | ICD-10-CM | POA: Diagnosis not present

## 2018-07-25 DIAGNOSIS — M549 Dorsalgia, unspecified: Secondary | ICD-10-CM | POA: Diagnosis not present

## 2018-07-25 DIAGNOSIS — B351 Tinea unguium: Secondary | ICD-10-CM | POA: Diagnosis not present

## 2018-07-25 DIAGNOSIS — G914 Hydrocephalus in diseases classified elsewhere: Secondary | ICD-10-CM | POA: Diagnosis not present

## 2018-07-27 ENCOUNTER — Other Ambulatory Visit: Payer: Self-pay | Admitting: *Deleted

## 2018-07-27 NOTE — Patient Outreach (Signed)
[facility]) Care Management  07/27/2018  CLESTER CHLEBOWSKI 11-Aug-1927 [id number]   Case closure   Call attempts made on 04/01/18, 04/08/18 and 07/23/2018 (+Transition of care) Unsuccessful outreach letter sent on 04/01/18 without a response   Plan [facility] RN CM will close case after no response from patient within 10+ business days. Unable to reach [place] called Dr Inda Merlin office and left a voice message when sent to voice message for Ginny [phone]- updating on attempts to reach patient without success for referral and transition of care but now with case closure CM left CM contact number   Jesyca Weisenburger L. Lavina Hamman, RN, BSN, [place] Coordinator Office number [phone] Mobile number [phone]  Main [facility] number [phone] Fax number [phone]

## 2018-07-29 DIAGNOSIS — E1165 Type 2 diabetes mellitus with hyperglycemia: Secondary | ICD-10-CM | POA: Diagnosis not present

## 2018-07-29 DIAGNOSIS — G914 Hydrocephalus in diseases classified elsewhere: Secondary | ICD-10-CM | POA: Diagnosis not present

## 2018-07-29 DIAGNOSIS — M549 Dorsalgia, unspecified: Secondary | ICD-10-CM | POA: Diagnosis not present

## 2018-07-29 DIAGNOSIS — R627 Adult failure to thrive: Secondary | ICD-10-CM | POA: Diagnosis not present

## 2018-07-29 DIAGNOSIS — R270 Ataxia, unspecified: Secondary | ICD-10-CM | POA: Diagnosis not present

## 2018-07-29 DIAGNOSIS — I4891 Unspecified atrial fibrillation: Secondary | ICD-10-CM | POA: Diagnosis not present

## 2018-07-29 DIAGNOSIS — R159 Full incontinence of feces: Secondary | ICD-10-CM | POA: Diagnosis not present

## 2018-08-25 DIAGNOSIS — M549 Dorsalgia, unspecified: Secondary | ICD-10-CM | POA: Diagnosis not present

## 2018-08-25 DIAGNOSIS — B351 Tinea unguium: Secondary | ICD-10-CM | POA: Diagnosis not present

## 2018-08-25 DIAGNOSIS — I4891 Unspecified atrial fibrillation: Secondary | ICD-10-CM | POA: Diagnosis not present

## 2018-08-25 DIAGNOSIS — G914 Hydrocephalus in diseases classified elsewhere: Secondary | ICD-10-CM | POA: Diagnosis not present

## 2018-08-25 DIAGNOSIS — R159 Full incontinence of feces: Secondary | ICD-10-CM | POA: Diagnosis not present

## 2018-08-31 DIAGNOSIS — G914 Hydrocephalus in diseases classified elsewhere: Secondary | ICD-10-CM | POA: Diagnosis not present

## 2018-08-31 DIAGNOSIS — R627 Adult failure to thrive: Secondary | ICD-10-CM | POA: Diagnosis not present

## 2018-08-31 DIAGNOSIS — E1165 Type 2 diabetes mellitus with hyperglycemia: Secondary | ICD-10-CM | POA: Diagnosis not present

## 2018-08-31 DIAGNOSIS — R159 Full incontinence of feces: Secondary | ICD-10-CM | POA: Diagnosis not present

## 2018-08-31 DIAGNOSIS — M549 Dorsalgia, unspecified: Secondary | ICD-10-CM | POA: Diagnosis not present

## 2018-08-31 DIAGNOSIS — I4891 Unspecified atrial fibrillation: Secondary | ICD-10-CM | POA: Diagnosis not present

## 2018-08-31 DIAGNOSIS — R270 Ataxia, unspecified: Secondary | ICD-10-CM | POA: Diagnosis not present

## 2018-09-03 DIAGNOSIS — M545 Low back pain: Secondary | ICD-10-CM | POA: Diagnosis not present

## 2018-09-03 DIAGNOSIS — I4891 Unspecified atrial fibrillation: Secondary | ICD-10-CM | POA: Diagnosis not present

## 2018-09-03 DIAGNOSIS — N401 Enlarged prostate with lower urinary tract symptoms: Secondary | ICD-10-CM | POA: Diagnosis not present

## 2018-09-03 DIAGNOSIS — E1140 Type 2 diabetes mellitus with diabetic neuropathy, unspecified: Secondary | ICD-10-CM | POA: Diagnosis not present

## 2018-09-03 DIAGNOSIS — G20 Parkinson's disease: Secondary | ICD-10-CM | POA: Diagnosis not present

## 2018-09-03 DIAGNOSIS — I10 Essential (primary) hypertension: Secondary | ICD-10-CM | POA: Diagnosis not present

## 2018-09-03 DIAGNOSIS — N39498 Other specified urinary incontinence: Secondary | ICD-10-CM | POA: Diagnosis not present

## 2018-09-03 DIAGNOSIS — G914 Hydrocephalus in diseases classified elsewhere: Secondary | ICD-10-CM | POA: Diagnosis not present

## 2018-09-03 DIAGNOSIS — F419 Anxiety disorder, unspecified: Secondary | ICD-10-CM | POA: Diagnosis not present

## 2018-09-04 DIAGNOSIS — F419 Anxiety disorder, unspecified: Secondary | ICD-10-CM | POA: Diagnosis not present

## 2018-09-04 DIAGNOSIS — N401 Enlarged prostate with lower urinary tract symptoms: Secondary | ICD-10-CM | POA: Diagnosis not present

## 2018-09-04 DIAGNOSIS — G20 Parkinson's disease: Secondary | ICD-10-CM | POA: Diagnosis not present

## 2018-09-04 DIAGNOSIS — I4891 Unspecified atrial fibrillation: Secondary | ICD-10-CM | POA: Diagnosis not present

## 2018-09-04 DIAGNOSIS — I10 Essential (primary) hypertension: Secondary | ICD-10-CM | POA: Diagnosis not present

## 2018-09-04 DIAGNOSIS — N39498 Other specified urinary incontinence: Secondary | ICD-10-CM | POA: Diagnosis not present

## 2018-09-04 DIAGNOSIS — M545 Low back pain: Secondary | ICD-10-CM | POA: Diagnosis not present

## 2018-09-04 DIAGNOSIS — G914 Hydrocephalus in diseases classified elsewhere: Secondary | ICD-10-CM | POA: Diagnosis not present

## 2018-09-04 DIAGNOSIS — E1140 Type 2 diabetes mellitus with diabetic neuropathy, unspecified: Secondary | ICD-10-CM | POA: Diagnosis not present

## 2018-09-07 DIAGNOSIS — E1140 Type 2 diabetes mellitus with diabetic neuropathy, unspecified: Secondary | ICD-10-CM | POA: Diagnosis not present

## 2018-09-07 DIAGNOSIS — I10 Essential (primary) hypertension: Secondary | ICD-10-CM | POA: Diagnosis not present

## 2018-09-07 DIAGNOSIS — I4891 Unspecified atrial fibrillation: Secondary | ICD-10-CM | POA: Diagnosis not present

## 2018-09-07 DIAGNOSIS — G20 Parkinson's disease: Secondary | ICD-10-CM | POA: Diagnosis not present

## 2018-09-07 DIAGNOSIS — N39498 Other specified urinary incontinence: Secondary | ICD-10-CM | POA: Diagnosis not present

## 2018-09-07 DIAGNOSIS — N401 Enlarged prostate with lower urinary tract symptoms: Secondary | ICD-10-CM | POA: Diagnosis not present

## 2018-09-07 DIAGNOSIS — F419 Anxiety disorder, unspecified: Secondary | ICD-10-CM | POA: Diagnosis not present

## 2018-09-07 DIAGNOSIS — G914 Hydrocephalus in diseases classified elsewhere: Secondary | ICD-10-CM | POA: Diagnosis not present

## 2018-09-07 DIAGNOSIS — M545 Low back pain: Secondary | ICD-10-CM | POA: Diagnosis not present

## 2018-09-08 DIAGNOSIS — M545 Low back pain: Secondary | ICD-10-CM | POA: Diagnosis not present

## 2018-09-08 DIAGNOSIS — F419 Anxiety disorder, unspecified: Secondary | ICD-10-CM | POA: Diagnosis not present

## 2018-09-08 DIAGNOSIS — G914 Hydrocephalus in diseases classified elsewhere: Secondary | ICD-10-CM | POA: Diagnosis not present

## 2018-09-08 DIAGNOSIS — N401 Enlarged prostate with lower urinary tract symptoms: Secondary | ICD-10-CM | POA: Diagnosis not present

## 2018-09-08 DIAGNOSIS — N39498 Other specified urinary incontinence: Secondary | ICD-10-CM | POA: Diagnosis not present

## 2018-09-08 DIAGNOSIS — G20 Parkinson's disease: Secondary | ICD-10-CM | POA: Diagnosis not present

## 2018-09-08 DIAGNOSIS — E1140 Type 2 diabetes mellitus with diabetic neuropathy, unspecified: Secondary | ICD-10-CM | POA: Diagnosis not present

## 2018-09-08 DIAGNOSIS — I4891 Unspecified atrial fibrillation: Secondary | ICD-10-CM | POA: Diagnosis not present

## 2018-09-08 DIAGNOSIS — I10 Essential (primary) hypertension: Secondary | ICD-10-CM | POA: Diagnosis not present

## 2018-09-10 DIAGNOSIS — N401 Enlarged prostate with lower urinary tract symptoms: Secondary | ICD-10-CM | POA: Diagnosis not present

## 2018-09-10 DIAGNOSIS — G914 Hydrocephalus in diseases classified elsewhere: Secondary | ICD-10-CM | POA: Diagnosis not present

## 2018-09-10 DIAGNOSIS — I4891 Unspecified atrial fibrillation: Secondary | ICD-10-CM | POA: Diagnosis not present

## 2018-09-10 DIAGNOSIS — F419 Anxiety disorder, unspecified: Secondary | ICD-10-CM | POA: Diagnosis not present

## 2018-09-10 DIAGNOSIS — M545 Low back pain: Secondary | ICD-10-CM | POA: Diagnosis not present

## 2018-09-10 DIAGNOSIS — N39498 Other specified urinary incontinence: Secondary | ICD-10-CM | POA: Diagnosis not present

## 2018-09-10 DIAGNOSIS — I10 Essential (primary) hypertension: Secondary | ICD-10-CM | POA: Diagnosis not present

## 2018-09-10 DIAGNOSIS — E1140 Type 2 diabetes mellitus with diabetic neuropathy, unspecified: Secondary | ICD-10-CM | POA: Diagnosis not present

## 2018-09-10 DIAGNOSIS — G20 Parkinson's disease: Secondary | ICD-10-CM | POA: Diagnosis not present

## 2018-09-14 DIAGNOSIS — G914 Hydrocephalus in diseases classified elsewhere: Secondary | ICD-10-CM | POA: Diagnosis not present

## 2018-09-14 DIAGNOSIS — E1140 Type 2 diabetes mellitus with diabetic neuropathy, unspecified: Secondary | ICD-10-CM | POA: Diagnosis not present

## 2018-09-14 DIAGNOSIS — G20 Parkinson's disease: Secondary | ICD-10-CM | POA: Diagnosis not present

## 2018-09-14 DIAGNOSIS — M545 Low back pain: Secondary | ICD-10-CM | POA: Diagnosis not present

## 2018-09-14 DIAGNOSIS — F419 Anxiety disorder, unspecified: Secondary | ICD-10-CM | POA: Diagnosis not present

## 2018-09-14 DIAGNOSIS — N39498 Other specified urinary incontinence: Secondary | ICD-10-CM | POA: Diagnosis not present

## 2018-09-14 DIAGNOSIS — N401 Enlarged prostate with lower urinary tract symptoms: Secondary | ICD-10-CM | POA: Diagnosis not present

## 2018-09-14 DIAGNOSIS — I4891 Unspecified atrial fibrillation: Secondary | ICD-10-CM | POA: Diagnosis not present

## 2018-09-14 DIAGNOSIS — I10 Essential (primary) hypertension: Secondary | ICD-10-CM | POA: Diagnosis not present

## 2018-09-15 DIAGNOSIS — I4891 Unspecified atrial fibrillation: Secondary | ICD-10-CM | POA: Diagnosis not present

## 2018-09-15 DIAGNOSIS — G914 Hydrocephalus in diseases classified elsewhere: Secondary | ICD-10-CM | POA: Diagnosis not present

## 2018-09-15 DIAGNOSIS — I10 Essential (primary) hypertension: Secondary | ICD-10-CM | POA: Diagnosis not present

## 2018-09-15 DIAGNOSIS — F419 Anxiety disorder, unspecified: Secondary | ICD-10-CM | POA: Diagnosis not present

## 2018-09-15 DIAGNOSIS — M545 Low back pain: Secondary | ICD-10-CM | POA: Diagnosis not present

## 2018-09-15 DIAGNOSIS — N39498 Other specified urinary incontinence: Secondary | ICD-10-CM | POA: Diagnosis not present

## 2018-09-15 DIAGNOSIS — G20 Parkinson's disease: Secondary | ICD-10-CM | POA: Diagnosis not present

## 2018-09-15 DIAGNOSIS — N401 Enlarged prostate with lower urinary tract symptoms: Secondary | ICD-10-CM | POA: Diagnosis not present

## 2018-09-15 DIAGNOSIS — E1140 Type 2 diabetes mellitus with diabetic neuropathy, unspecified: Secondary | ICD-10-CM | POA: Diagnosis not present

## 2018-09-17 DIAGNOSIS — G20 Parkinson's disease: Secondary | ICD-10-CM | POA: Diagnosis not present

## 2018-09-17 DIAGNOSIS — E1140 Type 2 diabetes mellitus with diabetic neuropathy, unspecified: Secondary | ICD-10-CM | POA: Diagnosis not present

## 2018-09-17 DIAGNOSIS — I10 Essential (primary) hypertension: Secondary | ICD-10-CM | POA: Diagnosis not present

## 2018-09-17 DIAGNOSIS — I4891 Unspecified atrial fibrillation: Secondary | ICD-10-CM | POA: Diagnosis not present

## 2018-09-17 DIAGNOSIS — G914 Hydrocephalus in diseases classified elsewhere: Secondary | ICD-10-CM | POA: Diagnosis not present

## 2018-09-17 DIAGNOSIS — M545 Low back pain: Secondary | ICD-10-CM | POA: Diagnosis not present

## 2018-09-17 DIAGNOSIS — N39498 Other specified urinary incontinence: Secondary | ICD-10-CM | POA: Diagnosis not present

## 2018-09-17 DIAGNOSIS — F419 Anxiety disorder, unspecified: Secondary | ICD-10-CM | POA: Diagnosis not present

## 2018-09-17 DIAGNOSIS — N401 Enlarged prostate with lower urinary tract symptoms: Secondary | ICD-10-CM | POA: Diagnosis not present

## 2018-09-21 DIAGNOSIS — G20 Parkinson's disease: Secondary | ICD-10-CM | POA: Diagnosis not present

## 2018-09-21 DIAGNOSIS — G914 Hydrocephalus in diseases classified elsewhere: Secondary | ICD-10-CM | POA: Diagnosis not present

## 2018-09-21 DIAGNOSIS — E1140 Type 2 diabetes mellitus with diabetic neuropathy, unspecified: Secondary | ICD-10-CM | POA: Diagnosis not present

## 2018-09-21 DIAGNOSIS — I10 Essential (primary) hypertension: Secondary | ICD-10-CM | POA: Diagnosis not present

## 2018-09-21 DIAGNOSIS — M545 Low back pain: Secondary | ICD-10-CM | POA: Diagnosis not present

## 2018-09-21 DIAGNOSIS — N401 Enlarged prostate with lower urinary tract symptoms: Secondary | ICD-10-CM | POA: Diagnosis not present

## 2018-09-21 DIAGNOSIS — N39498 Other specified urinary incontinence: Secondary | ICD-10-CM | POA: Diagnosis not present

## 2018-09-21 DIAGNOSIS — I4891 Unspecified atrial fibrillation: Secondary | ICD-10-CM | POA: Diagnosis not present

## 2018-09-21 DIAGNOSIS — F419 Anxiety disorder, unspecified: Secondary | ICD-10-CM | POA: Diagnosis not present

## 2018-09-22 DIAGNOSIS — G914 Hydrocephalus in diseases classified elsewhere: Secondary | ICD-10-CM | POA: Diagnosis not present

## 2018-09-22 DIAGNOSIS — N401 Enlarged prostate with lower urinary tract symptoms: Secondary | ICD-10-CM | POA: Diagnosis not present

## 2018-09-22 DIAGNOSIS — F419 Anxiety disorder, unspecified: Secondary | ICD-10-CM | POA: Diagnosis not present

## 2018-09-22 DIAGNOSIS — I4891 Unspecified atrial fibrillation: Secondary | ICD-10-CM | POA: Diagnosis not present

## 2018-09-22 DIAGNOSIS — M545 Low back pain: Secondary | ICD-10-CM | POA: Diagnosis not present

## 2018-09-22 DIAGNOSIS — G20 Parkinson's disease: Secondary | ICD-10-CM | POA: Diagnosis not present

## 2018-09-22 DIAGNOSIS — E1140 Type 2 diabetes mellitus with diabetic neuropathy, unspecified: Secondary | ICD-10-CM | POA: Diagnosis not present

## 2018-09-22 DIAGNOSIS — I10 Essential (primary) hypertension: Secondary | ICD-10-CM | POA: Diagnosis not present

## 2018-09-22 DIAGNOSIS — N39498 Other specified urinary incontinence: Secondary | ICD-10-CM | POA: Diagnosis not present

## 2018-09-23 DIAGNOSIS — G914 Hydrocephalus in diseases classified elsewhere: Secondary | ICD-10-CM | POA: Diagnosis not present

## 2018-09-23 DIAGNOSIS — B351 Tinea unguium: Secondary | ICD-10-CM | POA: Diagnosis not present

## 2018-09-23 DIAGNOSIS — M549 Dorsalgia, unspecified: Secondary | ICD-10-CM | POA: Diagnosis not present

## 2018-09-23 DIAGNOSIS — I4891 Unspecified atrial fibrillation: Secondary | ICD-10-CM | POA: Diagnosis not present

## 2018-09-24 DIAGNOSIS — N39498 Other specified urinary incontinence: Secondary | ICD-10-CM | POA: Diagnosis not present

## 2018-09-24 DIAGNOSIS — E1140 Type 2 diabetes mellitus with diabetic neuropathy, unspecified: Secondary | ICD-10-CM | POA: Diagnosis not present

## 2018-09-24 DIAGNOSIS — F419 Anxiety disorder, unspecified: Secondary | ICD-10-CM | POA: Diagnosis not present

## 2018-09-24 DIAGNOSIS — N401 Enlarged prostate with lower urinary tract symptoms: Secondary | ICD-10-CM | POA: Diagnosis not present

## 2018-09-24 DIAGNOSIS — M545 Low back pain: Secondary | ICD-10-CM | POA: Diagnosis not present

## 2018-09-24 DIAGNOSIS — G914 Hydrocephalus in diseases classified elsewhere: Secondary | ICD-10-CM | POA: Diagnosis not present

## 2018-09-24 DIAGNOSIS — G20 Parkinson's disease: Secondary | ICD-10-CM | POA: Diagnosis not present

## 2018-09-24 DIAGNOSIS — I4891 Unspecified atrial fibrillation: Secondary | ICD-10-CM | POA: Diagnosis not present

## 2018-09-24 DIAGNOSIS — I10 Essential (primary) hypertension: Secondary | ICD-10-CM | POA: Diagnosis not present

## 2018-10-24 DIAGNOSIS — I4891 Unspecified atrial fibrillation: Secondary | ICD-10-CM | POA: Diagnosis not present

## 2018-10-24 DIAGNOSIS — G914 Hydrocephalus in diseases classified elsewhere: Secondary | ICD-10-CM | POA: Diagnosis not present

## 2018-10-24 DIAGNOSIS — B351 Tinea unguium: Secondary | ICD-10-CM | POA: Diagnosis not present

## 2018-10-24 DIAGNOSIS — M549 Dorsalgia, unspecified: Secondary | ICD-10-CM | POA: Diagnosis not present

## 2018-10-29 DIAGNOSIS — R270 Ataxia, unspecified: Secondary | ICD-10-CM | POA: Diagnosis not present

## 2018-10-29 DIAGNOSIS — R627 Adult failure to thrive: Secondary | ICD-10-CM | POA: Diagnosis not present

## 2018-10-29 DIAGNOSIS — G914 Hydrocephalus in diseases classified elsewhere: Secondary | ICD-10-CM | POA: Diagnosis not present

## 2018-10-29 DIAGNOSIS — I4891 Unspecified atrial fibrillation: Secondary | ICD-10-CM | POA: Diagnosis not present

## 2018-10-29 DIAGNOSIS — R159 Full incontinence of feces: Secondary | ICD-10-CM | POA: Diagnosis not present

## 2018-10-29 DIAGNOSIS — E1165 Type 2 diabetes mellitus with hyperglycemia: Secondary | ICD-10-CM | POA: Diagnosis not present

## 2018-10-29 DIAGNOSIS — M549 Dorsalgia, unspecified: Secondary | ICD-10-CM | POA: Diagnosis not present

## 2018-11-23 DIAGNOSIS — G914 Hydrocephalus in diseases classified elsewhere: Secondary | ICD-10-CM | POA: Diagnosis not present

## 2018-11-23 DIAGNOSIS — M549 Dorsalgia, unspecified: Secondary | ICD-10-CM | POA: Diagnosis not present

## 2018-11-23 DIAGNOSIS — B351 Tinea unguium: Secondary | ICD-10-CM | POA: Diagnosis not present

## 2018-11-23 DIAGNOSIS — I4891 Unspecified atrial fibrillation: Secondary | ICD-10-CM | POA: Diagnosis not present

## 2018-12-24 DIAGNOSIS — M549 Dorsalgia, unspecified: Secondary | ICD-10-CM | POA: Diagnosis not present

## 2018-12-24 DIAGNOSIS — G914 Hydrocephalus in diseases classified elsewhere: Secondary | ICD-10-CM | POA: Diagnosis not present

## 2018-12-24 DIAGNOSIS — I4891 Unspecified atrial fibrillation: Secondary | ICD-10-CM | POA: Diagnosis not present

## 2018-12-24 DIAGNOSIS — B351 Tinea unguium: Secondary | ICD-10-CM | POA: Diagnosis not present

## 2019-01-13 ENCOUNTER — Emergency Department (HOSPITAL_COMMUNITY): Payer: Medicare HMO

## 2019-01-13 ENCOUNTER — Emergency Department (HOSPITAL_COMMUNITY)
Admission: EM | Admit: 2019-01-13 | Discharge: 2019-01-13 | Disposition: A | Discharge status: Home / Self Care | Payer: Medicare HMO | Attending: Emergency Medicine | Admitting: Emergency Medicine

## 2019-01-13 ENCOUNTER — Encounter (HOSPITAL_COMMUNITY): Payer: Self-pay | Admitting: Emergency Medicine

## 2019-01-13 ENCOUNTER — Other Ambulatory Visit: Payer: Self-pay

## 2019-01-13 DIAGNOSIS — Z87891 Personal history of nicotine dependence: Secondary | ICD-10-CM | POA: Insufficient documentation

## 2019-01-13 DIAGNOSIS — E119 Type 2 diabetes mellitus without complications: Secondary | ICD-10-CM | POA: Insufficient documentation

## 2019-01-13 DIAGNOSIS — Z20828 Contact with and (suspected) exposure to other viral communicable diseases: Secondary | ICD-10-CM | POA: Diagnosis not present

## 2019-01-13 DIAGNOSIS — M6281 Muscle weakness (generalized): Secondary | ICD-10-CM | POA: Insufficient documentation

## 2019-01-13 DIAGNOSIS — E860 Dehydration: Secondary | ICD-10-CM | POA: Diagnosis not present

## 2019-01-13 DIAGNOSIS — R531 Weakness: Secondary | ICD-10-CM

## 2019-01-13 DIAGNOSIS — D649 Anemia, unspecified: Secondary | ICD-10-CM | POA: Diagnosis not present

## 2019-01-13 DIAGNOSIS — Z7982 Long term (current) use of aspirin: Secondary | ICD-10-CM | POA: Diagnosis not present

## 2019-01-13 DIAGNOSIS — Z794 Long term (current) use of insulin: Secondary | ICD-10-CM | POA: Insufficient documentation

## 2019-01-13 DIAGNOSIS — Z03818 Encounter for observation for suspected exposure to other biological agents ruled out: Secondary | ICD-10-CM | POA: Insufficient documentation

## 2019-01-13 DIAGNOSIS — I10 Essential (primary) hypertension: Secondary | ICD-10-CM | POA: Insufficient documentation

## 2019-01-13 DIAGNOSIS — R4182 Altered mental status, unspecified: Secondary | ICD-10-CM | POA: Diagnosis not present

## 2019-01-13 DIAGNOSIS — R69 Illness, unspecified: Secondary | ICD-10-CM | POA: Diagnosis not present

## 2019-01-13 DIAGNOSIS — R5381 Other malaise: Secondary | ICD-10-CM | POA: Diagnosis not present

## 2019-01-13 LAB — URINALYSIS, ROUTINE W REFLEX MICROSCOPIC
Bilirubin Urine: NEGATIVE
Glucose, UA: 50 mg/dL — AB
Hgb urine dipstick: NEGATIVE
Ketones, ur: NEGATIVE mg/dL
Leukocytes,Ua: NEGATIVE
Nitrite: NEGATIVE
Protein, ur: NEGATIVE mg/dL
Specific Gravity, Urine: 1.01 (ref 1.005–1.030)
pH: 5 (ref 5.0–8.0)

## 2019-01-13 LAB — COMPREHENSIVE METABOLIC PANEL
ALT: 66 U/L — ABNORMAL HIGH (ref 0–44)
AST: 31 U/L (ref 15–41)
Albumin: 3.5 g/dL (ref 3.5–5.0)
Alkaline Phosphatase: 71 U/L (ref 38–126)
Anion gap: 10 (ref 5–15)
BUN: 33 mg/dL — ABNORMAL HIGH (ref 8–23)
CO2: 14 mmol/L — ABNORMAL LOW (ref 22–32)
Calcium: 8 mg/dL — ABNORMAL LOW (ref 8.9–10.3)
Chloride: 117 mmol/L — ABNORMAL HIGH (ref 98–111)
Creatinine, Ser: 1.78 mg/dL — ABNORMAL HIGH (ref 0.61–1.24)
GFR calc Af Amer: 38 mL/min — ABNORMAL LOW (ref >60)
GFR calc non Af Amer: 33 mL/min — ABNORMAL LOW (ref >60)
Glucose, Bld: 138 mg/dL — ABNORMAL HIGH (ref 70–99)
Potassium: 3.7 mmol/L (ref 3.5–5.1)
Sodium: 141 mmol/L (ref 135–145)
Total Bilirubin: 0.3 mg/dL (ref 0.3–1.2)
Total Protein: 5.9 g/dL — ABNORMAL LOW (ref 6.5–8.1)

## 2019-01-13 LAB — CBC WITH DIFFERENTIAL/PLATELET
Abs Immature Granulocytes: 0.03 10*3/uL (ref 0.00–0.07)
Basophils Absolute: 0 10*3/uL (ref 0.0–0.1)
Basophils Relative: 1 %
Eosinophils Absolute: 0.1 10*3/uL (ref 0.0–0.5)
Eosinophils Relative: 2 %
HCT: 28.5 % — ABNORMAL LOW (ref 39.0–52.0)
Hemoglobin: 9 g/dL — ABNORMAL LOW (ref 13.0–17.0)
Immature Granulocytes: 1 %
Lymphocytes Relative: 22 %
Lymphs Abs: 1.1 10*3/uL (ref 0.7–4.0)
MCH: 34.4 pg — ABNORMAL HIGH (ref 26.0–34.0)
MCHC: 31.6 g/dL (ref 30.0–36.0)
MCV: 108.8 fL — ABNORMAL HIGH (ref 80.0–100.0)
Monocytes Absolute: 0.4 10*3/uL (ref 0.1–1.0)
Monocytes Relative: 8 %
Neutro Abs: 3.5 10*3/uL (ref 1.7–7.7)
Neutrophils Relative %: 66 %
Platelets: 164 10*3/uL (ref 150–400)
RBC: 2.62 MIL/uL — ABNORMAL LOW (ref 4.22–5.81)
RDW: 14.3 % (ref 11.5–15.5)
WBC: 5.2 10*3/uL (ref 4.0–10.5)
nRBC: 0 % (ref 0.0–0.2)

## 2019-01-13 LAB — LIPASE, BLOOD: Lipase: 17 U/L (ref 11–51)

## 2019-01-13 LAB — TSH: TSH: 1.738 u[IU]/mL (ref 0.350–4.500)

## 2019-01-13 LAB — SARS CORONAVIRUS 2 BY RT PCR (HOSPITAL ORDER, PERFORMED IN ~~LOC~~ HOSPITAL LAB): SARS Coronavirus 2: NEGATIVE

## 2019-01-13 MED ORDER — SODIUM CHLORIDE 0.9 % IV BOLUS
1000.0000 mL | Freq: Once | INTRAVENOUS | Status: AC
  Administered 2019-01-13: 1000 mL via INTRAVENOUS
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
## 2019-01-13 NOTE — ED Provider Notes (Signed)
[facility] DEPT Provider Note   CSN: [id number] Arrival date & time: 01/13/19  1653     History   Chief Complaint Chief Complaint  Patient presents with  . Weakness  . Failure To Thrive    HPI Craig Whitehead is a 85 y.o. male.  He is brought in by EMS for evaluation after his landlord and EMS did a safety check on him and found his room full of feces and bugs in food.  Patient himself denies any complaints.  He says he feels fine.  He said he has had a couple of falls due to balance issues but other than being sore he is not bothered by that.  He lives alone.  He said he takes his medicine pretty regular.  The triage note mentions not taking medicines for at least 4 days.     The history is provided by the patient and the EMS personnel.  Weakness Severity:  Moderate Onset quality:  Unable to specify Chronicity:  New Context: not alcohol use   Relieved by:  None tried Worsened by:  Nothing Ineffective treatments:  None tried Associated symptoms: difficulty walking and falls   Associated symptoms: no abdominal pain, no chest pain, no cough, no diarrhea, no dysuria, no fever, no foul-smelling urine, no headaches, no nausea, no shortness of breath, no stroke symptoms and no vomiting     Past Medical History:  Diagnosis Date  . Anxiety   . Atrial fibrillation ([place])   . Bladder cancer ([place])   . BPH (benign prostatic hypertrophy)   . Diabetes mellitus ([place])   . Diarrhea   . Difficult intubation   . ED (erectile dysfunction)   . GERD (gastroesophageal reflux disease)   . Hypercholesteremia   . Hypertension   . NPH (normal pressure hydrocephalus) ([place])   . Seborrheic dermatitis   . Vitamin D deficiency     Patient Active Problem List   Diagnosis Date Noted  . Pressure injury of skin 04/01/2017  . Protein-calorie malnutrition, severe 03/29/2017  . Hyperglycemia due to type 2 diabetes mellitus ([place]) 03/29/2017  . Acute kidney injury  superimposed on CKD ([place]) 03/29/2017  . Leukocytosis 03/29/2017  . Chronic systolic CHF (congestive heart failure) ([place]) 03/29/2017  . NPH (normal pressure hydrocephalus) ([place]) 03/29/2017  . BPH (benign prostatic hyperplasia) 03/29/2017  . History of bladder cancer 03/29/2017  . Homelessness 03/29/2017  . AKI (acute kidney injury) ([place])   . Chronic atrial fibrillation   . Weakness 03/28/2017  . Persistent atrial fibrillation ([place]): CHA2DS2Vasc 2 (age). Rate controlled without medications asymptomatic 06/02/2016  . Medication management 05/31/2016  . Major depressive disorder, recurrent severe without psychotic features ([place]) 05/13/2016    Past Surgical History:  Procedure Laterality Date  . BLADDER SURGERY     x 4 for bladder cancer  . BRAIN SURGERY    . CATARACT EXTRACTION Bilateral   . INGUINAL HERNIA REPAIR Bilateral   . TRANSTHORACIC ECHOCARDIOGRAM  05/2016   [facility]: EF 45-50%. Mildly reduced function (likely related to A. fib). Marketed LA dilation. Moderate RA dilation. Aortic sclerosis without stenosis. Mild-moderate TR. Mild-moderate pulmonary tension. Small pericardial effusion versus pericardial fat  . VENTRICULOPERITONEAL SHUNT  2002        Home Medications    Prior to Admission medications   Medication Sig Start Date End Date Taking? Authorizing Provider  aspirin EC 81 MG tablet Take 1 tablet (81 mg total) by mouth daily. 06/27/16   Glenetta Hew  W, MD  cholecalciferol (VITAMIN D) 1000 UNITS tablet Take 4,000 Units by mouth daily.    [provider]  cholestyramine (QUESTRAN) 4 g packet Take 1 packet (4 g total) by mouth daily. 08/06/16   Ladene Artist, MD  feeding supplement, GLUCERNA SHAKE, (GLUCERNA SHAKE) LIQD Take 237 mLs by mouth 3 (three) times daily between meals. 04/01/17   Eugenie Filler, MD  folic acid (FOLVITE) 1 MG tablet Take 1 tablet (1 mg total) by mouth daily. 04/02/17   Eugenie Filler, MD  insulin aspart  (NOVOLOG) 100 UNIT/ML injection Inject 3 Units into the skin 3 (three) times daily with meals. 04/01/17   Eugenie Filler, MD  insulin glargine (LANTUS) 100 UNIT/ML injection Inject 0.14 mLs (14 Units total) into the skin daily. 04/02/17   Eugenie Filler, MD  lidocaine (LIDODERM) 5 % Place 1 patch onto the skin daily. Remove & Discard patch within 12 hours or as directed by MD 07/20/18   Volanda Napoleon, PA-C  solifenacin (VESICARE) 10 MG tablet Take 10 mg by mouth daily.    [provider]  tamsulosin (FLOMAX) 0.4 MG CAPS capsule Take 1 capsule (0.4 mg total) by mouth daily. 04/02/17   Eugenie Filler, MD  thiamine 100 MG tablet Take 1 tablet (100 mg total) by mouth daily. 04/02/17   Eugenie Filler, MD  Triamcinolone Acetonide (TRIAMCINOLONE 0.1 % CREAM : EUCERIN) CREA Apply 1 application topically 2 (two) times daily. 04/01/17   Eugenie Filler, MD    Family History Family History  Problem Relation Age of Onset  . Rheum arthritis Mother   . Cancer Father     Social History Social History   Tobacco Use  . Smoking status: Former Smoker    Quit date: 07/06/1994    Years since quitting: 24.5  . Smokeless tobacco: Never Used  Substance Use Topics  . Alcohol use: Yes    Alcohol/week: 1.0 standard drinks    Types: 1 Shots of liquor per week    Comment: 1 bourbon daily  . Drug use: No     Allergies   Patient has no known allergies.   Review of Systems Review of Systems  Constitutional: Negative for fever.  HENT: Negative for sore throat.   Eyes: Negative for visual disturbance.  Respiratory: Negative for cough and shortness of breath.   Cardiovascular: Negative for chest pain.  Gastrointestinal: Negative for abdominal pain, diarrhea, nausea and vomiting.  Genitourinary: Negative for dysuria.  Musculoskeletal: Positive for falls. Negative for neck pain.  Skin: Negative for rash.  Neurological: Positive for weakness. Negative for headaches.      Physical Exam Updated Vital Signs Temp 98.4 F (36.9 C) (Oral)   Resp 16   Ht 5\' 7"  (1.702 m)   Wt 59 kg   BMI 20.36 kg/m   Physical Exam Vitals signs and nursing note reviewed.  Constitutional:      Appearance: He is well-developed.  HENT:     Head: Normocephalic and atraumatic.  Eyes:     Conjunctiva/sclera: Conjunctivae normal.  Neck:     Musculoskeletal: Neck supple.  Cardiovascular:     Rate and Rhythm: Normal rate and regular rhythm.     Heart sounds: No murmur.  Pulmonary:     Effort: Pulmonary effort is normal. No respiratory distress.     Breath sounds: Normal breath sounds.  Chest:    Abdominal:     Palpations: Abdomen is soft.     Tenderness:  There is no abdominal tenderness.  Musculoskeletal: Normal range of motion.        General: No tenderness.     Right lower leg: No edema.     Left lower leg: No edema.  Skin:    General: Skin is warm and dry.     Capillary Refill: Capillary refill takes less than 2 seconds.  Neurological:     General: No focal deficit present.     Mental Status: He is alert and oriented to person, place, and time.     Sensory: No sensory deficit.     Motor: No weakness.      ED Treatments / Results  Labs (all labs ordered are listed, but only abnormal results are displayed) Labs Reviewed  COMPREHENSIVE METABOLIC PANEL - Abnormal; Notable for the following components:      Result Value   Chloride 117 (*)    CO2 14 (*)    Glucose, Bld 138 (*)    BUN 33 (*)    Creatinine, Ser 1.78 (*)    Calcium 8.0 (*)    Total Protein 5.9 (*)    ALT 66 (*)    GFR calc non Af Amer 33 (*)    GFR calc Af Amer 38 (*)    All other components within normal limits  URINALYSIS, ROUTINE W REFLEX MICROSCOPIC - Abnormal; Notable for the following components:   Color, Urine STRAW (*)    Glucose, UA 50 (*)    All other components within normal limits  CBC WITH DIFFERENTIAL/PLATELET - Abnormal; Notable for the following components:   RBC 2.62  (*)    Hemoglobin 9.0 (*)    HCT 28.5 (*)    MCV 108.8 (*)    MCH 34.4 (*)    All other components within normal limits  SARS CORONAVIRUS 2 (HOSPITAL ORDER, [place] [facility])  LIPASE, BLOOD  TSH  CBC WITH DIFFERENTIAL/PLATELET    EKG EKG Interpretation  Date/Time:  Wednesday January 13 2019 19:46:50 EDT Ventricular Rate:  85 PR Interval:    QRS Duration: 107 QT Interval:  399 QTC Calculation: 475 R Axis:   -53 Text Interpretation:  Atrial fibrillation LAD, consider left anterior fascicular block similar to prior 12/19 Confirmed by Aletta Edouard [phone]) on 01/13/2019 7:49:18 PM   Radiology Ct Head Wo Contrast  Result Date: 01/13/2019 CLINICAL DATA:  85 year old male with altered mental status and generalized weakness. EXAM: CT HEAD WITHOUT CONTRAST TECHNIQUE: Contiguous axial images were obtained from the base of the skull through the vertex without intravenous contrast. COMPARISON:  Head CT dated 07/20/2018 FINDINGS: Brain: Right frontal approach ventriculostomy shunt with tip in the region of the right foramen Monro similar to prior CT. Stable appearance of the ventricular size. There is mild age-related atrophy and chronic microvascular ischemic changes. There is no acute intracranial hemorrhage. No mass effect or midline shift. No extra-axial fluid collection. Vascular: No hyperdense vessel or unexpected calcification. Skull: Normal. Negative for fracture or focal lesion. Sinuses/Orbits: No acute finding. Other: None IMPRESSION: 1. No acute intracranial hemorrhage. 2. Mild age-related atrophy and chronic microvascular ischemic changes. 3. Right frontal approach ventriculostomy shunt with stable appearance of the ventricular size. Electronically Signed   By: Anner Crete M.D.   On: 01/13/2019 21:44   Dg Chest Port 1 View  Result Date: 01/13/2019 CLINICAL DATA:  Weakness EXAM: PORTABLE CHEST 1 VIEW COMPARISON:  05/21/2017 FINDINGS: Mild cardiomegaly. No focal  airspace disease or effusion. Aortic atherosclerosis. Mild reticular opacity  at the left base, likely scarring. Right-sided shunt catheter. IMPRESSION: No active disease.  Mild cardiomegaly. Electronically Signed   By: Donavan Foil M.D.   On: 01/13/2019 20:43    Procedures Procedures (including critical care time)  Medications Ordered in ED Medications  sodium chloride 0.9 % bolus 1,000 mL (0 mLs Intravenous Stopped 01/13/19 2151)     Initial Impression / Assessment and Plan / ED Course  I have reviewed the triage vital signs and the nursing notes.  Pertinent labs & imaging results that were available during my care of the patient were reviewed by me and considered in my medical decision making (see chart for details).  Clinical Course as of Jan 14 1311  Wed Jan 13, 2019  1857 Patient here after being brought in by EMS for being found in an unkempt house.  He denies any medical complaints.  Differential diagnosis includes failure to thrive, dehydration, hyper glycemia, stroke, mental health issue.  Patient is awake and oriented and has a fairly benign exam.  We will send off some labs and chest x-ray urinalysis.  He has a history of hydrocephalus so we will also get a head CT.  I have placed a call into social work to see if they can help   [MB]  2022 Social work reached out to the patient and he was adamant that he was can return home to his apartment.  His work-up is ongoing but if I do not find a reason to admit him I do not feel like I can hold him against his will.  I did put him in for home health face-to-face so he can potentially get some services at home.   [MB]  2046 Patient's labs coming back and show his creatinine is elevated at 1.78 although this is similar to 5 months ago.  His bicarb is down so he may be a little dehydrated.  He is also anemic and is slightly lower than his last values from 5 months ago but he seems to be running generally in the same area.  His blood sugar is  not elevated despite not taking his insulin.  I will review this with the patient but I get the feeling he is going to be discharged either way.  I do not really see that he requires admission but would benefit from close medical follow-up.    [MB]  2154 I had a long discussion with the patient regarding staying in the hospital and he is adamant he is going home.  I have tried to impress upon him the need for close follow-up with his PCP and to work on getting some food and fluids and himself.  He seems to understand this and I told him to return to the emergency department if any worsening symptoms.   [MB]    Clinical Course User Index [MB] Hayden Rasmussen, MD   Craig Whitehead was evaluated in Emergency Department on 01/13/2019 for the symptoms described in the history of present illness. He was evaluated in the context of the global COVID-19 pandemic, which necessitated consideration that the patient might be at risk for infection with the SARS-CoV-2 virus that causes COVID-19. Institutional protocols and algorithms that pertain to the evaluation of patients at risk for COVID-19 are in a state of rapid change based on information released by regulatory bodies including the CDC and federal and state organizations. These policies and algorithms were followed during the patient's care in the ED.    CHA2DS2/VAS  Stroke Risk Points  Current as of 13 minutes ago     4 >= 2 Points: High Risk  1 - 1.99 Points: Medium Risk  0 Points: Low Risk    This is the only CHA2DS2/VAS Stroke Risk Points available for the past  year.: Last Change: N/A     Details    This score determines the patient's risk of having a stroke if the  patient has atrial fibrillation.       Points Metrics  1 Has Congestive Heart Failure:  Yes    Current as of 13 minutes ago  0 Has Vascular Disease:  No    Current as of 13 minutes ago  0 Has Hypertension:  No    Current as of 13 minutes ago  2 Age:  33    Current as of  13 minutes ago  1 Has Diabetes:  Yes    Current as of 13 minutes ago  0 Had Stroke:  No  Had TIA:  No  Had thromboembolism:  No    Current as of 13 minutes ago  0 Male:  No    Current as of 13 minutes ago            Final Clinical Impressions(s) / ED Diagnoses   Final diagnoses:  Anemia, unspecified type  Dehydration  Generalized weakness    ED Discharge Orders    None       Hayden Rasmussen, MD 01/14/19 1313

## 2019-01-13 NOTE — ED Notes (Signed)
Bed: WA20 Expected date:  Expected time:  Means of arrival:  Comments: EMS/FTT

## 2019-01-13 NOTE — ED Triage Notes (Signed)
Landlord checked on the patient at home when he noticed a strong, horrible smell and a lot of maggots and flies near the patient's door. Patient did not respond to his knocks. Landlord then called EMS. Inside they found the patient talking near the window of a room with feces, bugs and food everywhere throughout the house. EMS reports him having no complaints and was A&O x 2. Patient reported to EMS that his wife died and he has been on a downward spiral for the last 2 weeks. He is aware of a change in his attitude to one of apathy. He did request social work consult. He has not taking medication, including insulin, in 4 days.   EMS vitals and CBG: 120/78 BP 70 HR 163 CBG

## 2019-01-13 NOTE — Discharge Instructions (Signed)
You were seen in the emergency department for concerns that you were not taking good care of yourself.  You had blood work, EKG, chest x-ray, and a CAT scan of your head.  Your tests show that you were dehydrated and that you are more anemic than usual.  We discussed admission to the hospital but you felt you were doing well enough to go home.  We are setting you up with a home health evaluation.  Please follow-up with your doctor and return to the emergency department if any worsening symptoms.

## 2019-01-13 NOTE — ED Triage Notes (Signed)
EMS tells me that they were notified concomitantly with police by neighbors, who reported a "strong stench, as a dead body" wafting from his residence. Paramedics tell me they found him to be living "in absolute squalor with rotting food and filth everywhere". Pt. Is in no distress and is alert and oriented.

## 2019-01-14 NOTE — TOC Initial Note (Signed)
Transition of Care [facility]) - Initial/Assessment Note    Patient Details  Name: Craig Whitehead MRN: [id number] Date of Birth: Aug 08, 1927  Transition of Care [facility]) CM/SW Contact:    Erenest Rasher, RN Phone Number: [phone] 01/14/2019, 2:43 PM  Clinical Narrative:                 Reviewed notes from [place] and pt needs HH. No physical address on the chart. Contacted pt and left HIPAA compliant message for a return call. Contacted brother and left HIPAA compliant message for a return call. Contacted [place] for [facility], spoke to rep and they will follow up on referral.  Contacted EMS for address, [address], [place] [address]. Provided address to [facility] agency, [employer]. Pt per notes is HOH and does not like to talk long on the phone per previous THN CM. He has DME in the home. Will have [place] SW to assist with community resources to assist with cleanliness of the home. Jonnie Finner RN CCM Case Mgmt phone [phone]           Expected Discharge Plan: [place] Barriers to Discharge: No Barriers Identified   Patient Goals and CMS Choice Patient states their goals for this hospitalization and ongoing recovery are:: need someone to assist with cleaning home CMS Medicare.gov Compare Post Acute Care list provided to:: Patient Choice offered to / list presented to : Patient  Expected Discharge Plan and Services Expected Discharge Plan: [place] In-house Referral: Clinical Social Work Discharge Planning Services: CM Consult Post Acute Care Choice: Home Health(unable to reach patient by phone,reviewed previous CSW notes)                             HH Arranged: PT, RN, Nurse's Aide, Social Work [employer] Agency: [facility] Date [place]: 01/14/19 Time [place]: 1441 Representative spoke with at [place]: Glyn Ade  Prior Living Arrangements/Services   Lives with:: Self Patient language and need for  interpreter reviewed:: Yes Do you feel safe going back to the place where you live?: Yes        Care giver support system in place?: Yes (comment) Current home services: DME(walker, wheelchair, cane) Criminal Activity/Legal Involvement Pertinent to Current Situation/Hospitalization: No - Comment as needed  Activities of Daily Living      Permission Sought/Granted Permission sought to share information with : Case Manager, PCP, Family Supports, Other (comment) Permission granted to share information with : Yes, Verbal Permission Granted(per CSW notes)     Permission granted to share info w AGENCY: [place]        Emotional Assessment       Orientation: : Oriented to Self, Oriented to Place, Oriented to  Time, Oriented to Situation(per CSW notes)   Psych Involvement: No (comment)  Admission diagnosis:  wellness check Patient Active Problem List   Diagnosis Date Noted  . Pressure injury of skin 04/01/2017  . Protein-calorie malnutrition, severe 03/29/2017  . Hyperglycemia due to type 2 diabetes mellitus ([place]) 03/29/2017  . Acute kidney injury superimposed on CKD ([place]) 03/29/2017  . Leukocytosis 03/29/2017  . Chronic systolic CHF (congestive heart failure) ([place]) 03/29/2017  . NPH (normal pressure hydrocephalus) ([place]) 03/29/2017  . BPH (benign prostatic hyperplasia) 03/29/2017  . History of bladder cancer 03/29/2017  . Homelessness 03/29/2017  . AKI (acute kidney injury) ([place])   . Chronic  atrial fibrillation   . Weakness 03/28/2017  . Persistent atrial fibrillation ([place]): CHA2DS2Vasc 2 (age). Rate controlled without medications asymptomatic 06/02/2016  . Medication management 05/31/2016  . Major depressive disorder, recurrent severe without psychotic features ([place]) 05/13/2016   PCP:  Josetta Huddle, MD Pharmacy:   [id number] - [place], [place] - [address] [place] [address] Phone: [phone]  Fax: [place] [address], [place] - [address] [place] [place] [address] Phone: [phone] Fax: [phone]     Social Determinants of Health (SDOH) Interventions    Readmission Risk Interventions No flowsheet data found.

## 2019-01-14 NOTE — Progress Notes (Signed)
01/14/2019 2:35 pm  [facility] ED TOC CM -referral [facility]  Reviewed notes from [place] and pt needs HH. No physical address on the chart. Contacted pt and left HIPAA compliant message for a return call. Contacted brother and left HIPAA compliant message for a return call. Contacted [place] for [facility], spoke to rep and they will follow up on referral.  Contacted EMS for address, [address], [place] [address]. Provided address to [facility] agency, [employer]. Pt per notes is HOH and does not like to talk long on the phone per previous THN CM. He has DME in the home. Will have [place] SW to assist with community resources to assist with cleanliness of the home. Jonnie Finner RN CCM Case Mgmt phone [phone]

## 2019-01-20 DIAGNOSIS — I5022 Chronic systolic (congestive) heart failure: Secondary | ICD-10-CM | POA: Diagnosis not present

## 2019-01-20 DIAGNOSIS — I482 Chronic atrial fibrillation, unspecified: Secondary | ICD-10-CM | POA: Diagnosis not present

## 2019-01-20 DIAGNOSIS — E43 Unspecified severe protein-calorie malnutrition: Secondary | ICD-10-CM | POA: Diagnosis not present

## 2019-01-20 DIAGNOSIS — N400 Enlarged prostate without lower urinary tract symptoms: Secondary | ICD-10-CM | POA: Diagnosis not present

## 2019-01-20 DIAGNOSIS — E119 Type 2 diabetes mellitus without complications: Secondary | ICD-10-CM | POA: Diagnosis not present

## 2019-01-20 DIAGNOSIS — D649 Anemia, unspecified: Secondary | ICD-10-CM | POA: Diagnosis not present

## 2019-01-20 DIAGNOSIS — E559 Vitamin D deficiency, unspecified: Secondary | ICD-10-CM | POA: Diagnosis not present

## 2019-01-20 DIAGNOSIS — I110 Hypertensive heart disease with heart failure: Secondary | ICD-10-CM | POA: Diagnosis not present

## 2019-01-20 DIAGNOSIS — R2689 Other abnormalities of gait and mobility: Secondary | ICD-10-CM | POA: Diagnosis not present

## 2019-01-21 DIAGNOSIS — E43 Unspecified severe protein-calorie malnutrition: Secondary | ICD-10-CM | POA: Diagnosis not present

## 2019-01-21 DIAGNOSIS — N400 Enlarged prostate without lower urinary tract symptoms: Secondary | ICD-10-CM | POA: Diagnosis not present

## 2019-01-21 DIAGNOSIS — D649 Anemia, unspecified: Secondary | ICD-10-CM | POA: Diagnosis not present

## 2019-01-21 DIAGNOSIS — E119 Type 2 diabetes mellitus without complications: Secondary | ICD-10-CM | POA: Diagnosis not present

## 2019-01-21 DIAGNOSIS — R2689 Other abnormalities of gait and mobility: Secondary | ICD-10-CM | POA: Diagnosis not present

## 2019-01-21 DIAGNOSIS — I482 Chronic atrial fibrillation, unspecified: Secondary | ICD-10-CM | POA: Diagnosis not present

## 2019-01-21 DIAGNOSIS — E559 Vitamin D deficiency, unspecified: Secondary | ICD-10-CM | POA: Diagnosis not present

## 2019-01-21 DIAGNOSIS — I110 Hypertensive heart disease with heart failure: Secondary | ICD-10-CM | POA: Diagnosis not present

## 2019-01-21 DIAGNOSIS — I5022 Chronic systolic (congestive) heart failure: Secondary | ICD-10-CM | POA: Diagnosis not present

## 2019-01-22 DIAGNOSIS — N400 Enlarged prostate without lower urinary tract symptoms: Secondary | ICD-10-CM | POA: Diagnosis not present

## 2019-01-22 DIAGNOSIS — I5022 Chronic systolic (congestive) heart failure: Secondary | ICD-10-CM | POA: Diagnosis not present

## 2019-01-22 DIAGNOSIS — I110 Hypertensive heart disease with heart failure: Secondary | ICD-10-CM | POA: Diagnosis not present

## 2019-01-22 DIAGNOSIS — R2689 Other abnormalities of gait and mobility: Secondary | ICD-10-CM | POA: Diagnosis not present

## 2019-01-22 DIAGNOSIS — E43 Unspecified severe protein-calorie malnutrition: Secondary | ICD-10-CM | POA: Diagnosis not present

## 2019-01-22 DIAGNOSIS — E559 Vitamin D deficiency, unspecified: Secondary | ICD-10-CM | POA: Diagnosis not present

## 2019-01-22 DIAGNOSIS — E119 Type 2 diabetes mellitus without complications: Secondary | ICD-10-CM | POA: Diagnosis not present

## 2019-01-22 DIAGNOSIS — I482 Chronic atrial fibrillation, unspecified: Secondary | ICD-10-CM | POA: Diagnosis not present

## 2019-01-22 DIAGNOSIS — D649 Anemia, unspecified: Secondary | ICD-10-CM | POA: Diagnosis not present

## 2019-01-26 DIAGNOSIS — N400 Enlarged prostate without lower urinary tract symptoms: Secondary | ICD-10-CM | POA: Diagnosis not present

## 2019-01-26 DIAGNOSIS — I5022 Chronic systolic (congestive) heart failure: Secondary | ICD-10-CM | POA: Diagnosis not present

## 2019-01-26 DIAGNOSIS — E43 Unspecified severe protein-calorie malnutrition: Secondary | ICD-10-CM | POA: Diagnosis not present

## 2019-01-26 DIAGNOSIS — R2689 Other abnormalities of gait and mobility: Secondary | ICD-10-CM | POA: Diagnosis not present

## 2019-01-26 DIAGNOSIS — E559 Vitamin D deficiency, unspecified: Secondary | ICD-10-CM | POA: Diagnosis not present

## 2019-01-26 DIAGNOSIS — I482 Chronic atrial fibrillation, unspecified: Secondary | ICD-10-CM | POA: Diagnosis not present

## 2019-01-26 DIAGNOSIS — D649 Anemia, unspecified: Secondary | ICD-10-CM | POA: Diagnosis not present

## 2019-01-26 DIAGNOSIS — I110 Hypertensive heart disease with heart failure: Secondary | ICD-10-CM | POA: Diagnosis not present

## 2019-01-26 DIAGNOSIS — E119 Type 2 diabetes mellitus without complications: Secondary | ICD-10-CM | POA: Diagnosis not present

## 2019-01-27 DIAGNOSIS — I110 Hypertensive heart disease with heart failure: Secondary | ICD-10-CM | POA: Diagnosis not present

## 2019-01-27 DIAGNOSIS — E119 Type 2 diabetes mellitus without complications: Secondary | ICD-10-CM | POA: Diagnosis not present

## 2019-01-27 DIAGNOSIS — I482 Chronic atrial fibrillation, unspecified: Secondary | ICD-10-CM | POA: Diagnosis not present

## 2019-01-27 DIAGNOSIS — D649 Anemia, unspecified: Secondary | ICD-10-CM | POA: Diagnosis not present

## 2019-01-27 DIAGNOSIS — N400 Enlarged prostate without lower urinary tract symptoms: Secondary | ICD-10-CM | POA: Diagnosis not present

## 2019-01-27 DIAGNOSIS — R2689 Other abnormalities of gait and mobility: Secondary | ICD-10-CM | POA: Diagnosis not present

## 2019-01-27 DIAGNOSIS — E43 Unspecified severe protein-calorie malnutrition: Secondary | ICD-10-CM | POA: Diagnosis not present

## 2019-01-27 DIAGNOSIS — I5022 Chronic systolic (congestive) heart failure: Secondary | ICD-10-CM | POA: Diagnosis not present

## 2019-01-27 DIAGNOSIS — E559 Vitamin D deficiency, unspecified: Secondary | ICD-10-CM | POA: Diagnosis not present

## 2019-01-28 DIAGNOSIS — I482 Chronic atrial fibrillation, unspecified: Secondary | ICD-10-CM | POA: Diagnosis not present

## 2019-01-28 DIAGNOSIS — R2689 Other abnormalities of gait and mobility: Secondary | ICD-10-CM | POA: Diagnosis not present

## 2019-01-28 DIAGNOSIS — N400 Enlarged prostate without lower urinary tract symptoms: Secondary | ICD-10-CM | POA: Diagnosis not present

## 2019-01-28 DIAGNOSIS — E43 Unspecified severe protein-calorie malnutrition: Secondary | ICD-10-CM | POA: Diagnosis not present

## 2019-01-28 DIAGNOSIS — E119 Type 2 diabetes mellitus without complications: Secondary | ICD-10-CM | POA: Diagnosis not present

## 2019-01-28 DIAGNOSIS — D649 Anemia, unspecified: Secondary | ICD-10-CM | POA: Diagnosis not present

## 2019-01-28 DIAGNOSIS — I110 Hypertensive heart disease with heart failure: Secondary | ICD-10-CM | POA: Diagnosis not present

## 2019-01-28 DIAGNOSIS — I5022 Chronic systolic (congestive) heart failure: Secondary | ICD-10-CM | POA: Diagnosis not present

## 2019-01-28 DIAGNOSIS — E559 Vitamin D deficiency, unspecified: Secondary | ICD-10-CM | POA: Diagnosis not present

## 2019-02-01 DIAGNOSIS — D649 Anemia, unspecified: Secondary | ICD-10-CM | POA: Diagnosis not present

## 2019-02-01 DIAGNOSIS — I482 Chronic atrial fibrillation, unspecified: Secondary | ICD-10-CM | POA: Diagnosis not present

## 2019-02-01 DIAGNOSIS — E119 Type 2 diabetes mellitus without complications: Secondary | ICD-10-CM | POA: Diagnosis not present

## 2019-02-01 DIAGNOSIS — E43 Unspecified severe protein-calorie malnutrition: Secondary | ICD-10-CM | POA: Diagnosis not present

## 2019-02-01 DIAGNOSIS — N400 Enlarged prostate without lower urinary tract symptoms: Secondary | ICD-10-CM | POA: Diagnosis not present

## 2019-02-01 DIAGNOSIS — I5022 Chronic systolic (congestive) heart failure: Secondary | ICD-10-CM | POA: Diagnosis not present

## 2019-02-01 DIAGNOSIS — E559 Vitamin D deficiency, unspecified: Secondary | ICD-10-CM | POA: Diagnosis not present

## 2019-02-01 DIAGNOSIS — I110 Hypertensive heart disease with heart failure: Secondary | ICD-10-CM | POA: Diagnosis not present

## 2019-02-01 DIAGNOSIS — R2689 Other abnormalities of gait and mobility: Secondary | ICD-10-CM | POA: Diagnosis not present

## 2019-02-25 ENCOUNTER — Emergency Department (HOSPITAL_COMMUNITY): Payer: Medicare HMO

## 2019-02-25 ENCOUNTER — Other Ambulatory Visit: Payer: Self-pay

## 2019-02-25 ENCOUNTER — Inpatient Hospital Stay (HOSPITAL_COMMUNITY)
Admission: EM | Admit: 2019-02-25 | Discharge: 2019-03-06 | DRG: 682 | Disposition: A | Discharge status: Skilled Nursing Facility | Payer: Medicare HMO | Attending: Internal Medicine | Admitting: Internal Medicine

## 2019-02-25 ENCOUNTER — Encounter (HOSPITAL_COMMUNITY): Payer: Self-pay

## 2019-02-25 DIAGNOSIS — B964 Proteus (mirabilis) (morganii) as the cause of diseases classified elsewhere: Secondary | ICD-10-CM | POA: Diagnosis present

## 2019-02-25 DIAGNOSIS — I5031 Acute diastolic (congestive) heart failure: Secondary | ICD-10-CM | POA: Diagnosis not present

## 2019-02-25 DIAGNOSIS — E1122 Type 2 diabetes mellitus with diabetic chronic kidney disease: Secondary | ICD-10-CM | POA: Diagnosis present

## 2019-02-25 DIAGNOSIS — I4821 Permanent atrial fibrillation: Secondary | ICD-10-CM | POA: Diagnosis not present

## 2019-02-25 DIAGNOSIS — G912 (Idiopathic) normal pressure hydrocephalus: Secondary | ICD-10-CM | POA: Diagnosis not present

## 2019-02-25 DIAGNOSIS — R531 Weakness: Secondary | ICD-10-CM

## 2019-02-25 DIAGNOSIS — I248 Other forms of acute ischemic heart disease: Secondary | ICD-10-CM | POA: Diagnosis present

## 2019-02-25 DIAGNOSIS — S299XXA Unspecified injury of thorax, initial encounter: Secondary | ICD-10-CM | POA: Diagnosis not present

## 2019-02-25 DIAGNOSIS — A419 Sepsis, unspecified organism: Secondary | ICD-10-CM | POA: Diagnosis not present

## 2019-02-25 DIAGNOSIS — S3992XA Unspecified injury of lower back, initial encounter: Secondary | ICD-10-CM | POA: Diagnosis not present

## 2019-02-25 DIAGNOSIS — R627 Adult failure to thrive: Secondary | ICD-10-CM | POA: Diagnosis present

## 2019-02-25 DIAGNOSIS — W06XXXA Fall from bed, initial encounter: Secondary | ICD-10-CM | POA: Diagnosis present

## 2019-02-25 DIAGNOSIS — N183 Chronic kidney disease, stage 3 (moderate): Secondary | ICD-10-CM | POA: Diagnosis present

## 2019-02-25 DIAGNOSIS — I480 Paroxysmal atrial fibrillation: Secondary | ICD-10-CM | POA: Diagnosis not present

## 2019-02-25 DIAGNOSIS — Z87891 Personal history of nicotine dependence: Secondary | ICD-10-CM

## 2019-02-25 DIAGNOSIS — D638 Anemia in other chronic diseases classified elsewhere: Secondary | ICD-10-CM | POA: Diagnosis present

## 2019-02-25 DIAGNOSIS — E860 Dehydration: Secondary | ICD-10-CM

## 2019-02-25 DIAGNOSIS — Z8551 Personal history of malignant neoplasm of bladder: Secondary | ICD-10-CM

## 2019-02-25 DIAGNOSIS — R0682 Tachypnea, not elsewhere classified: Secondary | ICD-10-CM

## 2019-02-25 DIAGNOSIS — M2550 Pain in unspecified joint: Secondary | ICD-10-CM | POA: Diagnosis not present

## 2019-02-25 DIAGNOSIS — N400 Enlarged prostate without lower urinary tract symptoms: Secondary | ICD-10-CM | POA: Diagnosis present

## 2019-02-25 DIAGNOSIS — S199XXA Unspecified injury of neck, initial encounter: Secondary | ICD-10-CM | POA: Diagnosis not present

## 2019-02-25 DIAGNOSIS — Z20828 Contact with and (suspected) exposure to other viral communicable diseases: Secondary | ICD-10-CM | POA: Diagnosis present

## 2019-02-25 DIAGNOSIS — E872 Acidosis: Secondary | ICD-10-CM

## 2019-02-25 DIAGNOSIS — M5489 Other dorsalgia: Secondary | ICD-10-CM | POA: Diagnosis not present

## 2019-02-25 DIAGNOSIS — W19XXXA Unspecified fall, initial encounter: Secondary | ICD-10-CM | POA: Diagnosis not present

## 2019-02-25 DIAGNOSIS — R41841 Cognitive communication deficit: Secondary | ICD-10-CM | POA: Diagnosis not present

## 2019-02-25 DIAGNOSIS — Z7401 Bed confinement status: Secondary | ICD-10-CM | POA: Diagnosis not present

## 2019-02-25 DIAGNOSIS — I482 Chronic atrial fibrillation, unspecified: Secondary | ICD-10-CM | POA: Diagnosis present

## 2019-02-25 DIAGNOSIS — E1165 Type 2 diabetes mellitus with hyperglycemia: Secondary | ICD-10-CM | POA: Diagnosis present

## 2019-02-25 DIAGNOSIS — E875 Hyperkalemia: Secondary | ICD-10-CM | POA: Diagnosis present

## 2019-02-25 DIAGNOSIS — R0602 Shortness of breath: Secondary | ICD-10-CM

## 2019-02-25 DIAGNOSIS — D539 Nutritional anemia, unspecified: Secondary | ICD-10-CM | POA: Diagnosis present

## 2019-02-25 DIAGNOSIS — R52 Pain, unspecified: Secondary | ICD-10-CM | POA: Diagnosis not present

## 2019-02-25 DIAGNOSIS — Z9181 History of falling: Secondary | ICD-10-CM

## 2019-02-25 DIAGNOSIS — Y92003 Bedroom of unspecified non-institutional (private) residence as the place of occurrence of the external cause: Secondary | ICD-10-CM | POA: Diagnosis not present

## 2019-02-25 DIAGNOSIS — S0990XA Unspecified injury of head, initial encounter: Secondary | ICD-10-CM | POA: Diagnosis not present

## 2019-02-25 DIAGNOSIS — N179 Acute kidney failure, unspecified: Secondary | ICD-10-CM | POA: Diagnosis not present

## 2019-02-25 DIAGNOSIS — N390 Urinary tract infection, site not specified: Secondary | ICD-10-CM | POA: Diagnosis not present

## 2019-02-25 DIAGNOSIS — M545 Low back pain: Secondary | ICD-10-CM | POA: Diagnosis not present

## 2019-02-25 DIAGNOSIS — R4182 Altered mental status, unspecified: Secondary | ICD-10-CM | POA: Diagnosis not present

## 2019-02-25 DIAGNOSIS — B9620 Unspecified Escherichia coli [E. coli] as the cause of diseases classified elsewhere: Secondary | ICD-10-CM | POA: Diagnosis present

## 2019-02-25 DIAGNOSIS — E43 Unspecified severe protein-calorie malnutrition: Secondary | ICD-10-CM | POA: Diagnosis present

## 2019-02-25 DIAGNOSIS — Z794 Long term (current) use of insulin: Secondary | ICD-10-CM

## 2019-02-25 DIAGNOSIS — R159 Full incontinence of feces: Secondary | ICD-10-CM | POA: Diagnosis present

## 2019-02-25 DIAGNOSIS — G9341 Metabolic encephalopathy: Secondary | ICD-10-CM | POA: Diagnosis not present

## 2019-02-25 DIAGNOSIS — R410 Disorientation, unspecified: Secondary | ICD-10-CM | POA: Diagnosis not present

## 2019-02-25 DIAGNOSIS — I959 Hypotension, unspecified: Secondary | ICD-10-CM | POA: Diagnosis not present

## 2019-02-25 DIAGNOSIS — Z7982 Long term (current) use of aspirin: Secondary | ICD-10-CM

## 2019-02-25 DIAGNOSIS — I4891 Unspecified atrial fibrillation: Secondary | ICD-10-CM | POA: Diagnosis not present

## 2019-02-25 DIAGNOSIS — R402411 Glasgow coma scale score 13-15, in the field [EMT or ambulance]: Secondary | ICD-10-CM | POA: Diagnosis not present

## 2019-02-25 DIAGNOSIS — I469 Cardiac arrest, cause unspecified: Secondary | ICD-10-CM | POA: Diagnosis not present

## 2019-02-25 DIAGNOSIS — E8729 Other acidosis: Secondary | ICD-10-CM

## 2019-02-25 DIAGNOSIS — R2681 Unsteadiness on feet: Secondary | ICD-10-CM | POA: Diagnosis not present

## 2019-02-25 DIAGNOSIS — M6281 Muscle weakness (generalized): Secondary | ICD-10-CM | POA: Diagnosis not present

## 2019-02-25 DIAGNOSIS — Z8261 Family history of arthritis: Secondary | ICD-10-CM

## 2019-02-25 DIAGNOSIS — Z79899 Other long term (current) drug therapy: Secondary | ICD-10-CM

## 2019-02-25 DIAGNOSIS — R2689 Other abnormalities of gait and mobility: Secondary | ICD-10-CM | POA: Diagnosis not present

## 2019-02-25 LAB — CBC WITH DIFFERENTIAL/PLATELET
Abs Immature Granulocytes: 0.04 10*3/uL (ref 0.00–0.07)
Basophils Absolute: 0 10*3/uL (ref 0.0–0.1)
Basophils Relative: 0 %
Eosinophils Absolute: 0 10*3/uL (ref 0.0–0.5)
Eosinophils Relative: 0 %
HCT: 35.9 % — ABNORMAL LOW (ref 39.0–52.0)
Hemoglobin: 11 g/dL — ABNORMAL LOW (ref 13.0–17.0)
Immature Granulocytes: 0 %
Lymphocytes Relative: 10 %
Lymphs Abs: 1 10*3/uL (ref 0.7–4.0)
MCH: 33.8 pg (ref 26.0–34.0)
MCHC: 30.6 g/dL (ref 30.0–36.0)
MCV: 110.5 fL — ABNORMAL HIGH (ref 80.0–100.0)
Monocytes Absolute: 0.6 10*3/uL (ref 0.1–1.0)
Monocytes Relative: 6 %
Neutro Abs: 8.2 10*3/uL — ABNORMAL HIGH (ref 1.7–7.7)
Neutrophils Relative %: 84 %
Platelets: 256 10*3/uL (ref 150–400)
RBC: 3.25 MIL/uL — ABNORMAL LOW (ref 4.22–5.81)
RDW: 15.1 % (ref 11.5–15.5)
WBC: 9.8 10*3/uL (ref 4.0–10.5)
nRBC: 0 % (ref 0.0–0.2)

## 2019-02-25 LAB — COMPREHENSIVE METABOLIC PANEL
ALT: 22 U/L (ref 0–44)
AST: 17 U/L (ref 15–41)
Albumin: 4.4 g/dL (ref 3.5–5.0)
Alkaline Phosphatase: 66 U/L (ref 38–126)
Anion gap: 18 — ABNORMAL HIGH (ref 5–15)
BUN: 88 mg/dL — ABNORMAL HIGH (ref 8–23)
CO2: 11 mmol/L — ABNORMAL LOW (ref 22–32)
Calcium: 9.2 mg/dL (ref 8.9–10.3)
Chloride: 115 mmol/L — ABNORMAL HIGH (ref 98–111)
Creatinine, Ser: 3.45 mg/dL — ABNORMAL HIGH (ref 0.61–1.24)
GFR calc Af Amer: 17 mL/min — ABNORMAL LOW (ref >60)
GFR calc non Af Amer: 15 mL/min — ABNORMAL LOW (ref >60)
Glucose, Bld: 146 mg/dL — ABNORMAL HIGH (ref 70–99)
Potassium: 5.3 mmol/L — ABNORMAL HIGH (ref 3.5–5.1)
Sodium: 144 mmol/L (ref 135–145)
Total Bilirubin: 0.7 mg/dL (ref 0.3–1.2)
Total Protein: 7.4 g/dL (ref 6.5–8.1)

## 2019-02-25 LAB — CK: Total CK: 183 U/L (ref 49–397)

## 2019-02-25 LAB — LACTIC ACID, PLASMA: Lactic Acid, Venous: 1.5 mmol/L (ref 0.5–1.9)

## 2019-02-25 LAB — TROPONIN I (HIGH SENSITIVITY)
Troponin I (High Sensitivity): 34 ng/L — ABNORMAL HIGH (ref <18)
Troponin I (High Sensitivity): 38 ng/L — ABNORMAL HIGH (ref <18)

## 2019-02-25 LAB — GLUCOSE, CAPILLARY: Glucose-Capillary: 200 mg/dL — ABNORMAL HIGH (ref 70–99)

## 2019-02-25 LAB — PROTIME-INR
INR: 1.6 — ABNORMAL HIGH (ref 0.8–1.2)
Prothrombin Time: 19.1 seconds — ABNORMAL HIGH (ref 11.4–15.2)

## 2019-02-25 LAB — HEMOGLOBIN A1C
Hgb A1c MFr Bld: 7.1 % — ABNORMAL HIGH (ref 4.8–5.6)
Mean Plasma Glucose: 157.07 mg/dL

## 2019-02-25 LAB — CBG MONITORING, ED: Glucose-Capillary: 133 mg/dL — ABNORMAL HIGH (ref 70–99)

## 2019-02-25 LAB — TSH: TSH: 1.164 u[IU]/mL (ref 0.350–4.500)

## 2019-02-25 LAB — BRAIN NATRIURETIC PEPTIDE: B Natriuretic Peptide: 214.5 pg/mL — ABNORMAL HIGH (ref 0.0–100.0)

## 2019-02-25 LAB — MAGNESIUM: Magnesium: 2.1 mg/dL (ref 1.7–2.4)

## 2019-02-25 MED ORDER — VITAMIN B-1 100 MG PO TABS
100.0000 mg | ORAL_TABLET | Freq: Every day | ORAL | Status: DC
  Administered 2019-02-25 – 2019-03-06 (×10): 100 mg via ORAL
  Filled 2019-02-25 (×10): qty 1

## 2019-02-25 MED ORDER — STERILE WATER FOR INJECTION IV SOLN
INTRAVENOUS | Status: AC
  Administered 2019-02-25: 20:00:00 via INTRAVENOUS
  Filled 2019-02-25 (×4): qty 850

## 2019-02-25 MED ORDER — GLUCERNA SHAKE PO LIQD
237.0000 mL | Freq: Three times a day (TID) | ORAL | Status: DC
  Administered 2019-02-25 – 2019-03-06 (×27): 237 mL via ORAL
  Filled 2019-02-25 (×30): qty 237

## 2019-02-25 MED ORDER — HEPARIN SODIUM (PORCINE) 5000 UNIT/ML IJ SOLN
5000.0000 [IU] | Freq: Three times a day (TID) | INTRAMUSCULAR | Status: DC
  Administered 2019-02-25 – 2019-03-06 (×26): 5000 [IU] via SUBCUTANEOUS
  Filled 2019-02-25 (×27): qty 1

## 2019-02-25 MED ORDER — INSULIN ASPART 100 UNIT/ML ~~LOC~~ SOLN
0.0000 [IU] | Freq: Three times a day (TID) | SUBCUTANEOUS | Status: DC
  Administered 2019-02-26: 1 [IU] via SUBCUTANEOUS
  Administered 2019-02-26: 2 [IU] via SUBCUTANEOUS
  Administered 2019-02-27: 1 [IU] via SUBCUTANEOUS
  Administered 2019-02-27: 13:00:00 3 [IU] via SUBCUTANEOUS
  Administered 2019-02-27 – 2019-02-28 (×2): 2 [IU] via SUBCUTANEOUS
  Administered 2019-02-28: 5 [IU] via SUBCUTANEOUS
  Administered 2019-03-01: 2 [IU] via SUBCUTANEOUS
  Administered 2019-03-01 (×2): 1 [IU] via SUBCUTANEOUS
  Administered 2019-03-02: 5 [IU] via SUBCUTANEOUS
  Administered 2019-03-02: 08:00:00 1 [IU] via SUBCUTANEOUS
  Administered 2019-03-03: 2 [IU] via SUBCUTANEOUS
  Administered 2019-03-03 (×2): 1 [IU] via SUBCUTANEOUS
  Administered 2019-03-04: 12:00:00 3 [IU] via SUBCUTANEOUS
  Administered 2019-03-05: 1 [IU] via SUBCUTANEOUS
  Administered 2019-03-05: 13:00:00 3 [IU] via SUBCUTANEOUS
  Administered 2019-03-06: 1 [IU] via SUBCUTANEOUS
  Filled 2019-02-25: qty 0.09

## 2019-02-25 MED ORDER — TAMSULOSIN HCL 0.4 MG PO CAPS
0.4000 mg | ORAL_CAPSULE | Freq: Every day | ORAL | Status: DC
  Administered 2019-02-25 – 2019-03-06 (×10): 0.4 mg via ORAL
  Filled 2019-02-25 (×10): qty 1

## 2019-02-25 MED ORDER — ONDANSETRON HCL 4 MG/2ML IJ SOLN
4.0000 mg | Freq: Four times a day (QID) | INTRAMUSCULAR | Status: DC | PRN
  Administered 2019-02-28: 4 mg via INTRAVENOUS
  Filled 2019-02-25: qty 2

## 2019-02-25 MED ORDER — INSULIN ASPART 100 UNIT/ML ~~LOC~~ SOLN
0.0000 [IU] | Freq: Every day | SUBCUTANEOUS | Status: DC
  Filled 2019-02-25: qty 0.05

## 2019-02-25 MED ORDER — ACETAMINOPHEN 325 MG PO TABS
650.0000 mg | ORAL_TABLET | Freq: Four times a day (QID) | ORAL | Status: DC | PRN
  Administered 2019-02-28 – 2019-03-05 (×2): 650 mg via ORAL
  Filled 2019-02-25 (×2): qty 2

## 2019-02-25 MED ORDER — FOLIC ACID 1 MG PO TABS
1.0000 mg | ORAL_TABLET | Freq: Every day | ORAL | Status: DC
  Administered 2019-02-25 – 2019-03-06 (×10): 1 mg via ORAL
  Filled 2019-02-25 (×10): qty 1

## 2019-02-25 MED ORDER — SODIUM BICARBONATE 8.4 % IV SOLN
INTRAVENOUS | Status: DC
  Administered 2019-02-25: 18:00:00 via INTRAVENOUS
  Filled 2019-02-25: qty 150

## 2019-02-25 MED ORDER — SODIUM CHLORIDE 0.9 % IV SOLN
Freq: Once | INTRAVENOUS | Status: AC
  Administered 2019-02-25: 17:00:00 via INTRAVENOUS

## 2019-02-25 MED ORDER — METOPROLOL TARTRATE 5 MG/5ML IV SOLN: 2.5000 mg | INTRAVENOUS | Status: DC | PRN

## 2019-02-25 NOTE — ED Notes (Signed)
ED TO INPATIENT HANDOFF REPORT  Name/Age/Gender Latrelle Dodrill 85 y.o. male  Code Status    Code Status Orders  (From admission, onward)         Start     Ordered   02/25/19 1821  Full code  Continuous     02/25/19 1820        Code Status History    Date Active Date Inactive Code Status Order ID Comments User Context   03/28/2017 2134 04/01/2017 2038 DNR [id number]  Florencia Reasons, MD Inpatient   05/13/2016 1000 05/14/2016 2153 Full Code [id number]  Leo Grosser, MD ED   Advance Care Planning Activity      Home/SNF/Other Home  Chief Complaint Fall  Level of Care/Admitting Diagnosis ED Disposition    ED Disposition Condition [place]: [facility] [[id number]]  Level of Care: Stepdown [14]  Admit to SDU based on following criteria: Hemodynamic compromise or significant risk of instability:  Patient requiring short term acute titration and management of vasoactive drips, and invasive monitoring (i.e., CVP and Arterial line).  Covid Evaluation: Asymptomatic Screening Protocol (No Symptoms)  Diagnosis: AKI (acute kidney injury) [facility]) [[id number]]  Admitting Physician: Kayleen Memos [[id number]]  Attending Physician: Kayleen Memos [[id number]]  Estimated length of stay: past midnight tomorrow  Certification:: I certify this patient will need inpatient services for at least 2 midnights  PT Class (Do Not Modify): Inpatient [101]  PT Acc Code (Do Not Modify): Private [1]       Medical History Past Medical History:  Diagnosis Date  . Anxiety   . Atrial fibrillation ([place])   . Bladder cancer ([place])   . BPH (benign prostatic hypertrophy)   . Diabetes mellitus ([place])   . Diarrhea   . Difficult intubation   . ED (erectile dysfunction)   . GERD (gastroesophageal reflux disease)   . Hypercholesteremia   . Hypertension   . NPH (normal pressure hydrocephalus) ([place])   . Seborrheic dermatitis   . Vitamin D deficiency     Allergies No Known  Allergies  IV Location/Drains/Wounds Patient Lines/Drains/Airways Status   Active Line/Drains/Airways    Name:   Placement date:   Placement time:   Site:   Days:   Peripheral IV 02/25/19 Right Antecubital   02/25/19    1555    Antecubital   less than 1   External Urinary Catheter   03/29/17    1900    -   698   External Urinary Catheter   03/30/17    1924    -   697   Pressure Injury 04/01/17 Stage II -  Partial thickness loss of dermis presenting as a shallow open ulcer with a red, pink wound bed without slough.   04/01/17    0830     695          Labs/Imaging Results for orders placed or performed during the hospital encounter of 02/25/19 (from the past 48 hour(s))  CBG monitoring, ED     Status: Abnormal   Collection Time: 02/25/19  3:45 PM  Result Value Ref Range   Glucose-Capillary 133 (H) 70 - 99 mg/dL  Comprehensive metabolic panel     Status: Abnormal   Collection Time: 02/25/19  4:05 PM  Result Value Ref Range   Sodium 144 135 - 145 mmol/L   Potassium 5.3 (H) 3.5 - 5.1 mmol/L   Chloride 115 (H) 98 - 111 mmol/L   CO2 11 (  L) 22 - 32 mmol/L   Glucose, Bld 146 (H) 70 - 99 mg/dL   BUN 88 (H) 8 - 23 mg/dL   Creatinine, Ser 3.45 (H) 0.61 - 1.24 mg/dL   Calcium 9.2 8.9 - 10.3 mg/dL   Total Protein 7.4 6.5 - 8.1 g/dL   Albumin 4.4 3.5 - 5.0 g/dL   AST 17 15 - 41 U/L   ALT 22 0 - 44 U/L   Alkaline Phosphatase 66 38 - 126 U/L   Total Bilirubin 0.7 0.3 - 1.2 mg/dL   GFR calc non Af Amer 15 (L) >60 mL/min   GFR calc Af Amer 17 (L) >60 mL/min   Anion gap 18 (H) 5 - 15    Comment: Performed at [facility], [place] [address], [place], [place] [address]  Lactic acid, plasma     Status: None   Collection Time: 02/25/19  4:05 PM  Result Value Ref Range   Lactic Acid, Venous 1.5 0.5 - 1.9 mmol/L    Comment: Performed at [facility], [place] [address], [place], [place] [address]  CBC with Differential     Status: Abnormal   Collection Time:  02/25/19  4:05 PM  Result Value Ref Range   WBC 9.8 4.0 - 10.5 K/uL   RBC 3.25 (L) 4.22 - 5.81 MIL/uL   Hemoglobin 11.0 (L) 13.0 - 17.0 g/dL   HCT 35.9 (L) 39.0 - 52.0 %   MCV 110.5 (H) 80.0 - 100.0 fL   MCH 33.8 26.0 - 34.0 pg   MCHC 30.6 30.0 - 36.0 g/dL   RDW 15.1 11.5 - 15.5 %   Platelets 256 150 - 400 K/uL   nRBC 0.0 0.0 - 0.2 %   Neutrophils Relative % 84 %   Neutro Abs 8.2 (H) 1.7 - 7.7 K/uL   Lymphocytes Relative 10 %   Lymphs Abs 1.0 0.7 - 4.0 K/uL   Monocytes Relative 6 %   Monocytes Absolute 0.6 0.1 - 1.0 K/uL   Eosinophils Relative 0 %   Eosinophils Absolute 0.0 0.0 - 0.5 K/uL   Basophils Relative 0 %   Basophils Absolute 0.0 0.0 - 0.1 K/uL   WBC Morphology MORPHOLOGY UNREMARKABLE    Immature Granulocytes 0 %   Abs Immature Granulocytes 0.04 0.00 - 0.07 K/uL    Comment: Performed at [facility], [place] [address], [place], [place] [address]  Protime-INR     Status: Abnormal   Collection Time: 02/25/19  4:05 PM  Result Value Ref Range   Prothrombin Time 19.1 (H) 11.4 - 15.2 seconds   INR 1.6 (H) 0.8 - 1.2    Comment: (NOTE) INR goal varies based on device and disease states. Performed at [facility], [place] [address] [place],  [address]   CK     Status: None   Collection Time: 02/25/19  4:05 PM  Result Value Ref Range   Total CK 183 49 - 397 U/L    Comment: Performed at [facility], [place] [address], [place], [place] [address]  Troponin I (High Sensitivity)     Status: Abnormal   Collection Time: 02/25/19  4:05 PM  Result Value Ref Range   Troponin I (High Sensitivity) 34 (H) <18 ng/L    Comment: (NOTE) Elevated high sensitivity troponin I (hsTnI) values and significant  changes across serial measurements may suggest ACS but many other  chronic and acute conditions are known to elevate hsTnI results.  Refer to the "Links" section for chest pain algorithms and additional  guidance. Performed at  [facility], [place] [address], [place], [place] [address]   Brain natriuretic peptide     Status: Abnormal   Collection Time: 02/25/19  4:05 PM  Result Value Ref Range   B Natriuretic Peptide 214.5 (H) 0.0 - 100.0 pg/mL    Comment: Performed at [facility], [place] [address] [place], [place] [address]  Magnesium     Status: None   Collection Time: 02/25/19  4:05 PM  Result Value Ref Range   Magnesium 2.1 1.7 - 2.4 mg/dL    Comment: Performed at [facility], [place] [address], [place], [place] [address]   Dg Chest 2 View  Result Date: 02/25/2019 CLINICAL DATA:  85 year old male status post fall at home today. EXAM: CHEST - 2 VIEW COMPARISON:  01/13/2019 and earlier. FINDINGS: Portable AP semi upright view at 1653 hours. Larger lung volumes. Tortuous descending thoracic aorta. Other mediastinal contours are within normal limits. Visualized tracheal air column is within normal limits. No pneumothorax, pulmonary edema, pleural effusion or acute pulmonary opacity. Right nipple shadow. No acute osseous abnormality identified. Negative visible bowel gas pattern. Chronic shunt catheter coursing through the right chest. IMPRESSION: 1. No acute cardiopulmonary abnormality or acute traumatic injury identified. 2. Chronically tortuous descending thoracic aorta. Electronically Signed   By: Genevie Ann M.D.   On: 02/25/2019 17:15   Ct Head Wo Contrast  Result Date: 02/25/2019 CLINICAL DATA:  Ground level fall now with altered mental status EXAM: CT HEAD WITHOUT CONTRAST CT CERVICAL SPINE WITHOUT CONTRAST TECHNIQUE: Multidetector CT imaging of the head and cervical spine was performed following the standard protocol without intravenous contrast. Multiplanar CT image reconstructions of the cervical spine were also generated. COMPARISON:  Head CT January 13, 2019 FINDINGS: CT HEAD FINDINGS Brain: Redemonstration of a right frontal approach ventriculostomy shunt catheter  with the tip projecting at the level of the right foramen of [place]. Positioning is unchanged from prior. The ventricular caliber is unchanged. No shunt discontinuity. Ventriculomegaly is on the background of more diffuse parenchymal atrophy. Patchy areas of white matter hypoattenuation are most compatible with chronic microvascular angiopathy. No evidence of acute infarction, hemorrhage, hydrocephalus, extra-axial collection or mass lesion/mass effect. Vascular: Atherosclerotic calcification of the carotid siphons. No hyperdense vessel or unexpected calcification. Skull: Right frontal scalp reservoir. Tubing courses along the right scalp to the soft tissues of the right neck. No calvarial fracture or suspicious osseous lesion. No scalp swelling or hematoma. Sinuses/Orbits: Paranasal sinuses and mastoid air cells are predominantly clear. Orbital structures are unremarkable aside from prior lens extractions. Other: None CT CERVICAL SPINE FINDINGS Alignment: Slight straightening of the normal cervical lordosis on a degenerative basis. Skull base and vertebrae: No acute fracture or traumatic listhesis. No primary bone lesion or focal pathologic process. Soft tissues and spinal canal: No pre or paravertebral fluid or swelling. No visible canal hematoma. Disc levels: Multilevel intervertebral disc height loss with diffuse cervical spondylitic changes maximally from C5 through the included levels of the thoracic spine. Uncinate spurring and facet hypertrophic changes result in mild moderate multilevel foraminal stenoses but no severe spinal canal or foraminal stenosis is seen at any level in the imaged cervical spine. Upper chest: Apical is pleuroparenchymal scarring. No acute abnormality in the upper chest or imaged lung apices. Other: Vascular calcium in the right carotid bifurcation IMPRESSION: 1. No acute intracranial abnormality. Chronic senescent changes similar to prior.  2. Stable positioning of a right frontal  approach ventriculostomy shunt catheter and stable ventricular caliber. 3. No acute cervical spine fracture. 4. Multilevel degenerative cervical spondylosis without severe canal narrowing or foraminal stenosis. Electronically Signed   By: Lovena Le M.D.   On: 02/25/2019 17:05   Ct Cervical Spine Wo Contrast  Result Date: 02/25/2019 CLINICAL DATA:  Ground level fall now with altered mental status EXAM: CT HEAD WITHOUT CONTRAST CT CERVICAL SPINE WITHOUT CONTRAST TECHNIQUE: Multidetector CT imaging of the head and cervical spine was performed following the standard protocol without intravenous contrast. Multiplanar CT image reconstructions of the cervical spine were also generated. COMPARISON:  Head CT January 13, 2019 FINDINGS: CT HEAD FINDINGS Brain: Redemonstration of a right frontal approach ventriculostomy shunt catheter with the tip projecting at the level of the right foramen of Monro. Positioning is unchanged from prior. The ventricular caliber is unchanged. No shunt discontinuity. Ventriculomegaly is on the background of more diffuse parenchymal atrophy. Patchy areas of white matter hypoattenuation are most compatible with chronic microvascular angiopathy. No evidence of acute infarction, hemorrhage, hydrocephalus, extra-axial collection or mass lesion/mass effect. Vascular: Atherosclerotic calcification of the carotid siphons. No hyperdense vessel or unexpected calcification. Skull: Right frontal scalp reservoir. Tubing courses along the right scalp to the soft tissues of the right neck. No calvarial fracture or suspicious osseous lesion. No scalp swelling or hematoma. Sinuses/Orbits: Paranasal sinuses and mastoid air cells are predominantly clear. Orbital structures are unremarkable aside from prior lens extractions. Other: None CT CERVICAL SPINE FINDINGS Alignment: Slight straightening of the normal cervical lordosis on a degenerative basis. Skull base and vertebrae: No acute fracture or traumatic  listhesis. No primary bone lesion or focal pathologic process. Soft tissues and spinal canal: No pre or paravertebral fluid or swelling. No visible canal hematoma. Disc levels: Multilevel intervertebral disc height loss with diffuse cervical spondylitic changes maximally from C5 through the included levels of the thoracic spine. Uncinate spurring and facet hypertrophic changes result in mild moderate multilevel foraminal stenoses but no severe spinal canal or foraminal stenosis is seen at any level in the imaged cervical spine. Upper chest: Apical is pleuroparenchymal scarring. No acute abnormality in the upper chest or imaged lung apices. Other: Vascular calcium in the right carotid bifurcation IMPRESSION: 1. No acute intracranial abnormality. Chronic senescent changes similar to prior. 2. Stable positioning of a right frontal approach ventriculostomy shunt catheter and stable ventricular caliber. 3. No acute cervical spine fracture. 4. Multilevel degenerative cervical spondylosis without severe canal narrowing or foraminal stenosis. Electronically Signed   By: Lovena Le M.D.   On: 02/25/2019 17:05   Ct Lumbar Spine Wo Contrast  Result Date: 02/25/2019 CLINICAL DATA:  Tenderness to palpation over L-spine after ground level fall EXAM: CT LUMBAR SPINE WITHOUT CONTRAST TECHNIQUE: Multidetector CT imaging of the lumbar spine was performed without intravenous contrast administration. Multiplanar CT image reconstructions were also generated. COMPARISON:  Lumbar radiographs 07/20/2018 FINDINGS: Segmentation: 5 lumbar type vertebrae. Alignment: Preservation of the normal lumbar lordosis without traumatic listhesis. No abnormal facet widening. Vertebrae: The bones are diffusely demineralized which may limit detection of subtle compression deformity and nondisplaced fractures. No definite fracture or vertebral body height loss is evident. There is degenerative fusion of the L1-2 vertebral bodies. Large Schmorl's node  formations are present in the endplates along the I4-5 disc space. Osteophyte formation is noted diffusely in the lumbar spine as are hypertrophic degenerative facet changes. Paraspinal and other soft tissues: No pre or paravertebral fluid or swelling.  No visible canal hematoma. Disc levels: Multilevel intervertebral disc height loss with facet hypertrophic changes results in multilevel mild-to-moderate foraminal stenoses but no severe neural foraminal narrowing or significant spinal canal stenosis. Other: There is a small transcortical lucency through the left sacral ala. Could reflect a nondisplaced fracture/insufficiency fracture. Aortic atherosclerosis. Shunt catheter tubing in the low pelvis. Vascular calcifications in the renal hila. IMPRESSION: 1. The bones are diffusely demineralized which may limit detection of subtle compression deformity and nondisplaced fractures. No definite fracture or vertebral body height loss is evident. If there is persistent clinical concern for an acute fracture, consider MRI for further evaluation. 2. Question left sacral insufficiency fracture versus prominent vascular channel 3. Multilevel degenerative changes throughout the lumbar spine as described above. 4.  Aortic Atherosclerosis (ICD10-I70.0). Electronically Signed   By: Lovena Le M.D.   On: 02/25/2019 17:13    Pending Labs Unresulted Labs (From admission, onward)    Start     Ordered   02/26/19 7169  Basic metabolic panel  Daily,   R     02/25/19 1852   02/25/19 1857  Hemoglobin A1c  Add-on,   AD     02/25/19 1856   02/25/19 1854  TSH  Add-on,   AD     02/25/19 1853   02/25/19 1850  Culture, Urine  Once,   STAT    Question:  Patient immune status  Answer:  Normal   02/25/19 1850   02/25/19 1552  Urinalysis, Routine w reflex microscopic  ONCE - STAT,   STAT     02/25/19 1552          Vitals/Pain Today's Vitals   02/25/19 1800 02/25/19 1830 02/25/19 1945 02/25/19 2000  BP: (!) 128/113 107/75   112/86  Pulse: (!) 140 (!) 109 82   Resp: (!) 21 14 15 19   Temp:      TempSrc:      SpO2: 100% 98% 100%     Isolation Precautions No active isolations  Medications Medications  sodium bicarbonate 150 mEq in sterile water 1,000 mL infusion ( Intravenous Transfusing/Transfer 02/25/19 2035)  insulin aspart (novoLOG) injection 0-9 Units (has no administration in time range)  insulin aspart (novoLOG) injection 0-5 Units (has no administration in time range)  metoprolol tartrate (LOPRESSOR) injection 2.5 mg (has no administration in time range)  heparin injection 5,000 Units (5,000 Units Subcutaneous Given 02/25/19 2014)  ondansetron (ZOFRAN) injection 4 mg (has no administration in time range)  acetaminophen (TYLENOL) tablet 650 mg (has no administration in time range)  feeding supplement (GLUCERNA SHAKE) (GLUCERNA SHAKE) liquid 237 mL (237 mLs Oral Given 12/27/87 3810)  folic acid (FOLVITE) tablet 1 mg (1 mg Oral Given 02/25/19 1932)  tamsulosin (FLOMAX) capsule 0.4 mg (0.4 mg Oral Given 02/25/19 1932)  thiamine (VITAMIN B-1) tablet 100 mg (100 mg Oral Given 02/25/19 1932)  0.9 %  sodium chloride infusion ( Intravenous Stopped 02/25/19 1819)    Mobility non-ambulatory

## 2019-02-25 NOTE — Plan of Care (Signed)
  Problem: Education: Goal: Knowledge of General Education information will improve Description Including pain rating scale, medication(s)/side effects and non-pharmacologic comfort measures Outcome: Progressing   
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
## 2019-02-25 NOTE — H&P (Signed)
History and Physical  Craig Whitehead MVH:[id number] DOB: July 02, 1928 DOA: 02/25/2019  Referring physician: Dr. Melina Copa PCP: Josetta Huddle, MD  Outpatient Specialists: Cardiology Patient coming from: Home  Chief Complaint: [place] off my bed  HPI: Craig Whitehead is a 85 y.o. male with medical history significant for permanent A. fib, type 2 diabetes, normal pressure hydrocephalus, BPH, history of bladder cancer, who presented to [facility] ED via EMS after falling off his bed with generalized weakness.  States he has been feeling weaker lately with his legs giving out.  Associated with bowel incontinence and poor oral intake.  Lives alone.  States he has 3 children who live in the area but does not have a relationship with them.  Denies fevers but admits to feeling cold most of the time.  States he feels depressed and alone.  Denies suicidal or homicidal ideation.  When EMS arrived at his home patient was covered with feces.  Patient was brought in for further evaluation.  ED Course: Upon presentation to the ED, patient appears disheveled with dry mucous membranes, vital signs remarkable for A. fib with RVR.  Lab studies remarkable for significant anion gap metabolic acidosis and AKI.  CT head, cervical spine and lumbar spine unremarkable for any acute findings. TRH asked to admit.  Review of Systems: Review of systems as noted in the HPI. All other systems reviewed and are negative.   Past Medical History:  Diagnosis Date   Anxiety    Atrial fibrillation (HCC)    Bladder cancer (HCC)    BPH (benign prostatic hypertrophy)    Diabetes mellitus ([place])    Diarrhea    Difficult intubation    ED (erectile dysfunction)    GERD (gastroesophageal reflux disease)    Hypercholesteremia    Hypertension    NPH (normal pressure hydrocephalus) ([place])    Seborrheic dermatitis    Vitamin D deficiency    Past Surgical History:  Procedure Laterality Date   BLADDER SURGERY     x 4 for bladder  cancer   BRAIN SURGERY     CATARACT EXTRACTION Bilateral    INGUINAL HERNIA REPAIR Bilateral    TRANSTHORACIC ECHOCARDIOGRAM  05/2016   [facility] ([place]): EF 45-50%. Mildly reduced function (likely related to A. fib). Marketed LA dilation. Moderate RA dilation. Aortic sclerosis without stenosis. Mild-moderate TR. Mild-moderate pulmonary tension. Small pericardial effusion versus pericardial fat   VENTRICULOPERITONEAL SHUNT  2002    Social History:  reports that he quit smoking about 24 years ago. He has never used smokeless tobacco. He reports current alcohol use of about 1.0 standard drinks of alcohol per week. He reports that he does not use drugs.   No Known Allergies  Family History  Problem Relation Age of Onset   Rheum arthritis Mother    Cancer Father       Prior to Admission medications   Medication Sig Start Date End Date Taking? Authorizing Provider  insulin glargine (LANTUS) 100 UNIT/ML injection Inject 0.14 mLs (14 Units total) into the skin daily. Patient taking differently: Inject 30 Units into the skin daily.  04/02/17  Yes Eugenie Filler, MD  aspirin EC 81 MG tablet Take 1 tablet (81 mg total) by mouth daily. Patient not taking: Reported on 02/25/2019 06/27/16   Leonie Man, MD  cholestyramine Lucrezia Starch) 4 g packet Take 1 packet (4 g total) by mouth daily. Patient not taking: Reported on 02/25/2019 08/06/16   Ladene Artist, MD  feeding supplement, GLUCERNA  SHAKE, (GLUCERNA SHAKE) LIQD Take 237 mLs by mouth 3 (three) times daily between meals. Patient not taking: Reported on 02/25/2019 04/01/17   Eugenie Filler, MD  folic acid (FOLVITE) 1 MG tablet Take 1 tablet (1 mg total) by mouth daily. Patient not taking: Reported on 02/25/2019 04/02/17   Eugenie Filler, MD  insulin aspart (NOVOLOG) 100 UNIT/ML injection Inject 3 Units into the skin 3 (three) times daily with meals. Patient not taking: Reported on 02/25/2019 04/01/17   Eugenie Filler,  MD  lidocaine (LIDODERM) 5 % Place 1 patch onto the skin daily. Remove & Discard patch within 12 hours or as directed by MD Patient not taking: Reported on 02/25/2019 07/20/18   Volanda Napoleon, PA-C  tamsulosin (FLOMAX) 0.4 MG CAPS capsule Take 1 capsule (0.4 mg total) by mouth daily. Patient not taking: Reported on 02/25/2019 04/02/17   Eugenie Filler, MD  thiamine 100 MG tablet Take 1 tablet (100 mg total) by mouth daily. Patient not taking: Reported on 02/25/2019 04/02/17   Eugenie Filler, MD  Triamcinolone Acetonide (TRIAMCINOLONE 0.1 % CREAM : EUCERIN) CREA Apply 1 application topically 2 (two) times daily. Patient not taking: Reported on 02/25/2019 04/01/17   Eugenie Filler, MD    Physical Exam: BP (!) 128/113    Pulse (!) 140    Temp 97.6 F (36.4 C) (Oral)    Resp (!) 21    SpO2 100%    General: 85 y.o. year-old male emaciated in no acute distress.  Alert and oriented x3.  Dry mucous membranes.  Cardiovascular: Irregular rate and rhythm with no rubs or gallops.  No thyromegaly or JVD noted.  No lower extremity edema. 2/4 pulses in all 4 extremities.  Respiratory: Clear to auscultation with no wheezes or rales. Good inspiratory effort.  Abdomen: Soft nontender nondistended with normal bowel sounds x4 quadrants.  Muskuloskeletal: No cyanosis, clubbing or edema noted bilaterally  Neuro: CN II-XII intact, strength, sensation, reflexes  Skin: No ulcerative lesions noted.  Bruising in upper extremities bilaterally.  Psychiatry: Judgement and insight appear normal. Mood is appropriate for condition and setting          Labs on Admission:  Basic Metabolic Panel: Recent Labs  Lab 02/25/19 1605  NA 144  K 5.3*  CL 115*  CO2 11*  GLUCOSE 146*  BUN 88*  CREATININE 3.45*  CALCIUM 9.2  MG 2.1   Liver Function Tests: Recent Labs  Lab 02/25/19 1605  AST 17  ALT 22  ALKPHOS 66  BILITOT 0.7  PROT 7.4  ALBUMIN 4.4   No results for input(s): LIPASE, AMYLASE in the  last 168 hours. No results for input(s): AMMONIA in the last 168 hours. CBC: Recent Labs  Lab 02/25/19 1605  WBC 9.8  NEUTROABS 8.2*  HGB 11.0*  HCT 35.9*  MCV 110.5*  PLT 256   Cardiac Enzymes: Recent Labs  Lab 02/25/19 1605  CKTOTAL 183    BNP (last 3 results) Recent Labs    02/25/19 1605  BNP 214.5*    ProBNP (last 3 results) No results for input(s): PROBNP in the last 8760 hours.  CBG: Recent Labs  Lab 02/25/19 1545  GLUCAP 133*    Radiological Exams on Admission: Dg Chest 2 View  Result Date: 02/25/2019 CLINICAL DATA:  85 year old male status post fall at home today. EXAM: CHEST - 2 VIEW COMPARISON:  01/13/2019 and earlier. FINDINGS: Portable AP semi upright view at 1653 hours. Larger lung volumes. Tortuous descending  thoracic aorta. Other mediastinal contours are within normal limits. Visualized tracheal air column is within normal limits. No pneumothorax, pulmonary edema, pleural effusion or acute pulmonary opacity. Right nipple shadow. No acute osseous abnormality identified. Negative visible bowel gas pattern. Chronic shunt catheter coursing through the right chest. IMPRESSION: 1. No acute cardiopulmonary abnormality or acute traumatic injury identified. 2. Chronically tortuous descending thoracic aorta. Electronically Signed   By: Genevie Ann M.D.   On: 02/25/2019 17:15   Ct Head Wo Contrast  Result Date: 02/25/2019 CLINICAL DATA:  Ground level fall now with altered mental status EXAM: CT HEAD WITHOUT CONTRAST CT CERVICAL SPINE WITHOUT CONTRAST TECHNIQUE: Multidetector CT imaging of the head and cervical spine was performed following the standard protocol without intravenous contrast. Multiplanar CT image reconstructions of the cervical spine were also generated. COMPARISON:  Head CT January 13, 2019 FINDINGS: CT HEAD FINDINGS Brain: Redemonstration of a right frontal approach ventriculostomy shunt catheter with the tip projecting at the level of the right foramen of  [place]. Positioning is unchanged from prior. The ventricular caliber is unchanged. No shunt discontinuity. Ventriculomegaly is on the background of more diffuse parenchymal atrophy. Patchy areas of white matter hypoattenuation are most compatible with chronic microvascular angiopathy. No evidence of acute infarction, hemorrhage, hydrocephalus, extra-axial collection or mass lesion/mass effect. Vascular: Atherosclerotic calcification of the carotid siphons. No hyperdense vessel or unexpected calcification. Skull: Right frontal scalp reservoir. Tubing courses along the right scalp to the soft tissues of the right neck. No calvarial fracture or suspicious osseous lesion. No scalp swelling or hematoma. Sinuses/Orbits: Paranasal sinuses and mastoid air cells are predominantly clear. Orbital structures are unremarkable aside from prior lens extractions. Other: None CT CERVICAL SPINE FINDINGS Alignment: Slight straightening of the normal cervical lordosis on a degenerative basis. Skull base and vertebrae: No acute fracture or traumatic listhesis. No primary bone lesion or focal pathologic process. Soft tissues and spinal canal: No pre or paravertebral fluid or swelling. No visible canal hematoma. Disc levels: Multilevel intervertebral disc height loss with diffuse cervical spondylitic changes maximally from C5 through the included levels of the thoracic spine. Uncinate spurring and facet hypertrophic changes result in mild moderate multilevel foraminal stenoses but no severe spinal canal or foraminal stenosis is seen at any level in the imaged cervical spine. Upper chest: Apical is pleuroparenchymal scarring. No acute abnormality in the upper chest or imaged lung apices. Other: Vascular calcium in the right carotid bifurcation IMPRESSION: 1. No acute intracranial abnormality. Chronic senescent changes similar to prior. 2. Stable positioning of a right frontal approach ventriculostomy shunt catheter and stable ventricular  caliber. 3. No acute cervical spine fracture. 4. Multilevel degenerative cervical spondylosis without severe canal narrowing or foraminal stenosis. Electronically Signed   By: Lovena Le M.D.   On: 02/25/2019 17:05   Ct Cervical Spine Wo Contrast  Result Date: 02/25/2019 CLINICAL DATA:  Ground level fall now with altered mental status EXAM: CT HEAD WITHOUT CONTRAST CT CERVICAL SPINE WITHOUT CONTRAST TECHNIQUE: Multidetector CT imaging of the head and cervical spine was performed following the standard protocol without intravenous contrast. Multiplanar CT image reconstructions of the cervical spine were also generated. COMPARISON:  Head CT January 13, 2019 FINDINGS: CT HEAD FINDINGS Brain: Redemonstration of a right frontal approach ventriculostomy shunt catheter with the tip projecting at the level of the right foramen of Monro. Positioning is unchanged from prior. The ventricular caliber is unchanged. No shunt discontinuity. Ventriculomegaly is on the background of more diffuse parenchymal atrophy. Patchy areas of  white matter hypoattenuation are most compatible with chronic microvascular angiopathy. No evidence of acute infarction, hemorrhage, hydrocephalus, extra-axial collection or mass lesion/mass effect. Vascular: Atherosclerotic calcification of the carotid siphons. No hyperdense vessel or unexpected calcification. Skull: Right frontal scalp reservoir. Tubing courses along the right scalp to the soft tissues of the right neck. No calvarial fracture or suspicious osseous lesion. No scalp swelling or hematoma. Sinuses/Orbits: Paranasal sinuses and mastoid air cells are predominantly clear. Orbital structures are unremarkable aside from prior lens extractions. Other: None CT CERVICAL SPINE FINDINGS Alignment: Slight straightening of the normal cervical lordosis on a degenerative basis. Skull base and vertebrae: No acute fracture or traumatic listhesis. No primary bone lesion or focal pathologic process. Soft  tissues and spinal canal: No pre or paravertebral fluid or swelling. No visible canal hematoma. Disc levels: Multilevel intervertebral disc height loss with diffuse cervical spondylitic changes maximally from C5 through the included levels of the thoracic spine. Uncinate spurring and facet hypertrophic changes result in mild moderate multilevel foraminal stenoses but no severe spinal canal or foraminal stenosis is seen at any level in the imaged cervical spine. Upper chest: Apical is pleuroparenchymal scarring. No acute abnormality in the upper chest or imaged lung apices. Other: Vascular calcium in the right carotid bifurcation IMPRESSION: 1. No acute intracranial abnormality. Chronic senescent changes similar to prior. 2. Stable positioning of a right frontal approach ventriculostomy shunt catheter and stable ventricular caliber. 3. No acute cervical spine fracture. 4. Multilevel degenerative cervical spondylosis without severe canal narrowing or foraminal stenosis. Electronically Signed   By: Lovena Le M.D.   On: 02/25/2019 17:05   Ct Lumbar Spine Wo Contrast  Result Date: 02/25/2019 CLINICAL DATA:  Tenderness to palpation over L-spine after ground level fall EXAM: CT LUMBAR SPINE WITHOUT CONTRAST TECHNIQUE: Multidetector CT imaging of the lumbar spine was performed without intravenous contrast administration. Multiplanar CT image reconstructions were also generated. COMPARISON:  Lumbar radiographs 07/20/2018 FINDINGS: Segmentation: 5 lumbar type vertebrae. Alignment: Preservation of the normal lumbar lordosis without traumatic listhesis. No abnormal facet widening. Vertebrae: The bones are diffusely demineralized which may limit detection of subtle compression deformity and nondisplaced fractures. No definite fracture or vertebral body height loss is evident. There is degenerative fusion of the L1-2 vertebral bodies. Large Schmorl's node formations are present in the endplates along the R6-0 disc space.  Osteophyte formation is noted diffusely in the lumbar spine as are hypertrophic degenerative facet changes. Paraspinal and other soft tissues: No pre or paravertebral fluid or swelling. No visible canal hematoma. Disc levels: Multilevel intervertebral disc height loss with facet hypertrophic changes results in multilevel mild-to-moderate foraminal stenoses but no severe neural foraminal narrowing or significant spinal canal stenosis. Other: There is a small transcortical lucency through the left sacral ala. Could reflect a nondisplaced fracture/insufficiency fracture. Aortic atherosclerosis. Shunt catheter tubing in the low pelvis. Vascular calcifications in the renal hila. IMPRESSION: 1. The bones are diffusely demineralized which may limit detection of subtle compression deformity and nondisplaced fractures. No definite fracture or vertebral body height loss is evident. If there is persistent clinical concern for an acute fracture, consider MRI for further evaluation. 2. Question left sacral insufficiency fracture versus prominent vascular channel 3. Multilevel degenerative changes throughout the lumbar spine as described above. 4.  Aortic Atherosclerosis (ICD10-I70.0). Electronically Signed   By: Lovena Le M.D.   On: 02/25/2019 17:13    EKG: I independently viewed the EKG done and my findings are as followed: A. fib with RVR, QTC 511.  Assessment/Plan Present on Admission:  AKI (acute kidney injury) ([place])  Active Problems:   AKI (acute kidney injury) ([place])  AKI likely prerenal secondary to dehydration complicated by high anion gap metabolic acidosis Baseline creatinine appears to be 1.7 with GFR of 34 Presented with creatinine of 3.45 and GFR of 15 Anion gap 18 and bicarb 11 Start isotonic bicarb at 75 cc/h x 1 day Avoid nephrotoxins Closely monitor urine output Repeat BMP tomorrow  Anion gap metabolic acidosis likely multifactorial secondary to acute renal failure versus  others Management as stated above  Permanent A. fib with RVR Obtain TSH Give Lopressor pushes 2.5 mg every 4 hours as needed for heart rate greater than 120 with map greater than 65 Closely monitor on telemetry Obtain 2D echo  Elevated troponin Troponin S 34 Likely from demand ischemia in the setting of A. fib with RVR and acute renal failure No anginal symptoms Monitor on telemetry  Type 2 diabetes with hyperglycemia Obtain hemoglobin A1c Start insulin sliding scale, sensitive Avoid hypoglycemia Start heart healthy carb modified diet  QTC prolongation Personally reviewed twelve-lead EKG done on admission which shows QTC 511 Avoid QTC prolonging agents Repeat twelve-lead EKG in the morning  Hyperkalemia Potassium 5.3 Not on potassium supplement Continue IV fluid hydration and repeat levels in the morning  Elevated BNP BNP mildly elevated 214 in the setting of dehydration States he follows with cardiology outpatient We will get 2D echo  BPH Resume tamsulosin Obtain urine analysis Monitor urine output  Failure to thrive May require placement CSW to assist in placement Dietary consult Encourage oral intake  Physical debility PT OT to assess Fall precautions   Risks: Patient is high risk for decompensation due to AKI on CKD 3, permanent A. fib with RVR, multiple comorbidities and advanced age.  Patient will require at least 2 midnights for further evaluation and treatment of present condition.   DVT prophylaxis: Subcu heparin 3 times daily  Code Status: Full code; confirmed by the patient at bedside.  Family Communication: None at bedside.  Disposition Plan: Admit to stepdown unit  Consults called: None  Admission status: Inpatient status    Kayleen Memos MD [facility] Pager [phone]  If 7PM-7AM, please contact night-coverage [email or web address] Password [email or web address]  02/25/2019, 6:24 PM

## 2019-02-25 NOTE — ED Notes (Signed)
Lab called and requesting lavender tub for A1C; primary RN made aware.

## 2019-02-25 NOTE — ED Provider Notes (Addendum)
[facility] DEPT Provider Note   CSN: [id number] Arrival date & time: 02/25/19  1455     History   Chief Complaint Chief Complaint  Patient presents with  . Failure To Thrive    HPI Craig Whitehead is a 85 y.o. male.  Patient was transported by EMS from home after primary care doctor and caseworker suggested patient needs evaluation for skilled nursing facility placement.  Patient states that he had a fall this morning from bed, denies hitting his head but has a hard time recalling specific details regarding the fall.  States fall occurred around 830 and he laid in bed until early afternoon when the rescue squad picked him up.  EMS reported that house covered in patient's urine and feces, patient lives alone.  Patient states that he has a difficult time caring for himself, normally wears diapers and eats frozen foods.  Denies any chest pain, difficulty breathing.  Says that he is having some low back pain related to the fall.  Pain is currently mild in severity, not significant when at rest.     HPI  Past Medical History:  Diagnosis Date  . Anxiety   . Atrial fibrillation ([place])   . Bladder cancer ([place])   . BPH (benign prostatic hypertrophy)   . Diabetes mellitus ([place])   . Diarrhea   . Difficult intubation   . ED (erectile dysfunction)   . GERD (gastroesophageal reflux disease)   . Hypercholesteremia   . Hypertension   . NPH (normal pressure hydrocephalus) ([place])   . Seborrheic dermatitis   . Vitamin D deficiency     Patient Active Problem List   Diagnosis Date Noted  . Pressure injury of skin 04/01/2017  . Protein-calorie malnutrition, severe 03/29/2017  . Hyperglycemia due to type 2 diabetes mellitus ([place]) 03/29/2017  . Acute kidney injury superimposed on CKD ([place]) 03/29/2017  . Leukocytosis 03/29/2017  . Chronic systolic CHF (congestive heart failure) ([place]) 03/29/2017  . NPH (normal pressure hydrocephalus) ([place]) 03/29/2017  . BPH  (benign prostatic hyperplasia) 03/29/2017  . History of bladder cancer 03/29/2017  . Homelessness 03/29/2017  . AKI (acute kidney injury) ([place])   . Chronic atrial fibrillation   . Weakness 03/28/2017  . Persistent atrial fibrillation ([place]): CHA2DS2Vasc 2 (age). Rate controlled without medications asymptomatic 06/02/2016  . Medication management 05/31/2016  . Major depressive disorder, recurrent severe without psychotic features ([place]) 05/13/2016    Past Surgical History:  Procedure Laterality Date  . BLADDER SURGERY     x 4 for bladder cancer  . BRAIN SURGERY    . CATARACT EXTRACTION Bilateral   . INGUINAL HERNIA REPAIR Bilateral   . TRANSTHORACIC ECHOCARDIOGRAM  05/2016   [facility]: EF 45-50%. Mildly reduced function (likely related to A. fib). Marketed LA dilation. Moderate RA dilation. Aortic sclerosis without stenosis. Mild-moderate TR. Mild-moderate pulmonary tension. Small pericardial effusion versus pericardial fat  . VENTRICULOPERITONEAL SHUNT  2002        Home Medications    Prior to Admission medications   Medication Sig Start Date End Date Taking? Authorizing Provider  aspirin EC 81 MG tablet Take 1 tablet (81 mg total) by mouth daily. 06/27/16   Leonie Man, MD  cholecalciferol (VITAMIN D) 1000 UNITS tablet Take 4,000 Units by mouth daily.    [provider]  cholestyramine (QUESTRAN) 4 g packet Take 1 packet (4 g total) by mouth daily. 08/06/16   Ladene Artist, MD  feeding supplement, GLUCERNA SHAKE, (  GLUCERNA SHAKE) LIQD Take 237 mLs by mouth 3 (three) times daily between meals. 04/01/17   Eugenie Filler, MD  folic acid (FOLVITE) 1 MG tablet Take 1 tablet (1 mg total) by mouth daily. 04/02/17   Eugenie Filler, MD  insulin aspart (NOVOLOG) 100 UNIT/ML injection Inject 3 Units into the skin 3 (three) times daily with meals. 04/01/17   Eugenie Filler, MD  insulin glargine (LANTUS) 100 UNIT/ML injection Inject 0.14 mLs (14 Units  total) into the skin daily. 04/02/17   Eugenie Filler, MD  lidocaine (LIDODERM) 5 % Place 1 patch onto the skin daily. Remove & Discard patch within 12 hours or as directed by MD 07/20/18   Volanda Napoleon, PA-C  solifenacin (VESICARE) 10 MG tablet Take 10 mg by mouth daily.    [provider]  tamsulosin (FLOMAX) 0.4 MG CAPS capsule Take 1 capsule (0.4 mg total) by mouth daily. 04/02/17   Eugenie Filler, MD  thiamine 100 MG tablet Take 1 tablet (100 mg total) by mouth daily. 04/02/17   Eugenie Filler, MD  Triamcinolone Acetonide (TRIAMCINOLONE 0.1 % CREAM : EUCERIN) CREA Apply 1 application topically 2 (two) times daily. 04/01/17   Eugenie Filler, MD    Family History Family History  Problem Relation Age of Onset  . Rheum arthritis Mother   . Cancer Father     Social History Social History   Tobacco Use  . Smoking status: Former Smoker    Quit date: 07/06/1994    Years since quitting: 24.6  . Smokeless tobacco: Never Used  Substance Use Topics  . Alcohol use: Yes    Alcohol/week: 1.0 standard drinks    Types: 1 Shots of liquor per week    Comment: 1 bourbon daily  . Drug use: No     Allergies   Patient has no known allergies.   Review of Systems Review of Systems  Constitutional: Negative for chills and fever.  HENT: Negative for ear pain and sore throat.   Eyes: Negative for pain and visual disturbance.  Respiratory: Negative for cough and shortness of breath.   Cardiovascular: Negative for chest pain and palpitations.  Gastrointestinal: Negative for abdominal pain and vomiting.  Genitourinary: Negative for dysuria and hematuria.  Musculoskeletal: Positive for back pain. Negative for arthralgias.  Skin: Negative for color change and rash.  Neurological: Negative for seizures and syncope.  All other systems reviewed and are negative.    Physical Exam Updated Vital Signs BP (!) 173/154 (BP Location: Left Arm)   Pulse 81   Temp 97.6 F  (36.4 C) (Oral)   Resp 19   SpO2 100%   Physical Exam Constitutional:      Comments: Elderly, chronically ill-appearing gentleman lying in bed in no acute distress  HENT:     Head: Normocephalic and atraumatic.     Nose: Nose normal.     Mouth/Throat:     Mouth: Mucous membranes are moist.     Pharynx: Oropharynx is clear.  Eyes:     Extraocular Movements: Extraocular movements intact.     Pupils: Pupils are equal, round, and reactive to light.  Neck:     Comments: No midline tenderness palpation Cardiovascular:     Rate and Rhythm: Tachycardia present. Rhythm irregular.     Heart sounds: No murmur.  Pulmonary:     Effort: Pulmonary effort is normal.     Breath sounds: Normal breath sounds. No stridor. No rhonchi.  Abdominal:  General: Bowel sounds are normal.     Palpations: Abdomen is soft.     Tenderness: There is no rebound.  Musculoskeletal:     Comments: Tenderness location in the lower L-spine, no tenderness palpation in T-spine, no obvious deformity noted  No tenderness palpation throughout bilateral upper and lower extremities, no obvious deformity to upper and lower extremities  Skin:    General: Skin is warm and dry.     Capillary Refill: Capillary refill takes less than 2 seconds.  Neurological:     Comments: Alert, oriented, moving all 4 extremities spontaneously      ED Treatments / Results  Labs (all labs ordered are listed, but only abnormal results are displayed) Labs Reviewed  CBG MONITORING, ED - Abnormal; Notable for the following components:      Result Value   Glucose-Capillary 133 (*)    All other components within normal limits  COMPREHENSIVE METABOLIC PANEL  LACTIC ACID, PLASMA  LACTIC ACID, PLASMA  CBC WITH DIFFERENTIAL/PLATELET  PROTIME-INR  URINALYSIS, ROUTINE W REFLEX MICROSCOPIC    EKG None  Radiology No results found.  Procedures .Critical Care Performed by: Lucrezia Starch, MD Authorized by: Lucrezia Starch,  MD   Critical care provider statement:    Critical care time (minutes):  40   Critical care was necessary to treat or prevent imminent or life-threatening deterioration of the following conditions:  Renal failure and metabolic crisis   Critical care was time spent personally by me on the following activities:  Discussions with consultants, evaluation of patient's response to treatment, examination of patient, ordering and performing treatments and interventions, ordering and review of laboratory studies, ordering and review of radiographic studies, pulse oximetry, re-evaluation of patient's condition, obtaining history from patient or surrogate and review of old charts   (including critical care time)  Medications Ordered in ED Medications - No data to display   Initial Impression / Assessment and Plan / ED Course  I have reviewed the triage vital signs and the nursing notes.  Pertinent labs & imaging results that were available during my care of the patient were reviewed by me and considered in my medical decision making (see chart for details).  Clinical Course as of Feb 25 2307  Thu Feb 25, 2019  1603 Evalauted patient   [RD]  1725 Reviewed labs, will admit   [RD]    Clinical Course User Index [RD] Lucrezia Starch, MD       85 year old gentleman presenting to the emergency department after fall this morning.  Trauma work-up was negative.  More concerning though, patient appears to be severely dehydrated, significant anion gap metabolic acidosis.  Acute kidney injury.  Give patient fluid bolus, given acidosis, also bicarb drip.  Borderline hyperkalemia, no EKG related changes.  Will admit to hospital service for further management.  Dr. Nevada Crane to accept to tele bed.   Final Clinical Impressions(s) / ED Diagnoses   Final diagnoses:  Dehydration  AKI (acute kidney injury) ([place])  Increased anion gap metabolic acidosis  Atrial fibrillation, unspecified type [facility])    ED  Discharge Orders    None       Lucrezia Starch, MD 02/25/19 2311    Lucrezia Starch, MD 03/25/19 1359

## 2019-02-26 ENCOUNTER — Inpatient Hospital Stay (HOSPITAL_COMMUNITY): Payer: Medicare HMO

## 2019-02-26 DIAGNOSIS — E860 Dehydration: Secondary | ICD-10-CM

## 2019-02-26 DIAGNOSIS — E872 Acidosis: Secondary | ICD-10-CM

## 2019-02-26 DIAGNOSIS — I4891 Unspecified atrial fibrillation: Secondary | ICD-10-CM

## 2019-02-26 LAB — URINALYSIS, ROUTINE W REFLEX MICROSCOPIC
Bacteria, UA: NONE SEEN
Bilirubin Urine: NEGATIVE
Glucose, UA: 50 mg/dL — AB
Ketones, ur: 5 mg/dL — AB
Leukocytes,Ua: NEGATIVE
Nitrite: NEGATIVE
Protein, ur: NEGATIVE mg/dL
Specific Gravity, Urine: 1.011 (ref 1.005–1.030)
pH: 5 (ref 5.0–8.0)

## 2019-02-26 LAB — BASIC METABOLIC PANEL
Anion gap: 10 (ref 5–15)
Anion gap: 10 (ref 5–15)
BUN: 83 mg/dL — ABNORMAL HIGH (ref 8–23)
BUN: 73 mg/dL — ABNORMAL HIGH (ref 8–23)
CO2: 18 mmol/L — ABNORMAL LOW (ref 22–32)
CO2: 19 mmol/L — ABNORMAL LOW (ref 22–32)
Calcium: 8 mg/dL — ABNORMAL LOW (ref 8.9–10.3)
Calcium: 7.6 mg/dL — ABNORMAL LOW (ref 8.9–10.3)
Chloride: 111 mmol/L (ref 98–111)
Chloride: 109 mmol/L (ref 98–111)
Creatinine, Ser: 3.03 mg/dL — ABNORMAL HIGH (ref 0.61–1.24)
Creatinine, Ser: 2.79 mg/dL — ABNORMAL HIGH (ref 0.61–1.24)
GFR calc Af Amer: 20 mL/min — ABNORMAL LOW (ref >60)
GFR calc Af Amer: 22 mL/min — ABNORMAL LOW (ref >60)
GFR calc non Af Amer: 17 mL/min — ABNORMAL LOW (ref >60)
GFR calc non Af Amer: 19 mL/min — ABNORMAL LOW (ref >60)
Glucose, Bld: 204 mg/dL — ABNORMAL HIGH (ref 70–99)
Glucose, Bld: 205 mg/dL — ABNORMAL HIGH (ref 70–99)
Potassium: 4.5 mmol/L (ref 3.5–5.1)
Potassium: 4.3 mmol/L (ref 3.5–5.1)
Sodium: 139 mmol/L (ref 135–145)
Sodium: 138 mmol/L (ref 135–145)

## 2019-02-26 LAB — GLUCOSE, CAPILLARY
Glucose-Capillary: 131 mg/dL — ABNORMAL HIGH (ref 70–99)
Glucose-Capillary: 172 mg/dL — ABNORMAL HIGH (ref 70–99)
Glucose-Capillary: 194 mg/dL — ABNORMAL HIGH (ref 70–99)
Glucose-Capillary: 158 mg/dL — ABNORMAL HIGH (ref 70–99)

## 2019-02-26 LAB — MRSA PCR SCREENING: MRSA by PCR: NEGATIVE

## 2019-02-26 MED ORDER — CHLORHEXIDINE GLUCONATE CLOTH 2 % EX PADS
6.0000 | MEDICATED_PAD | Freq: Every day | CUTANEOUS | Status: DC
  Administered 2019-02-26: 6 via TOPICAL

## 2019-02-26 MED ORDER — ASPIRIN EC 81 MG PO TBEC
81.0000 mg | DELAYED_RELEASE_TABLET | Freq: Every day | ORAL | Status: DC
  Administered 2019-02-26 – 2019-03-06 (×9): 81 mg via ORAL
  Filled 2019-02-26 (×9): qty 1

## 2019-02-26 MED ORDER — ORAL CARE MOUTH RINSE
15.0000 mL | Freq: Two times a day (BID) | OROMUCOSAL | Status: DC
  Administered 2019-02-26 – 2019-03-06 (×11): 15 mL via OROMUCOSAL

## 2019-02-26 NOTE — CV Procedure (Signed)
Echocardiogram attempted but nondiagnostic imaging due to patients thin body habitus. No echo windows available.

## 2019-02-26 NOTE — Evaluation (Signed)
Physical Therapy Evaluation Patient Details Name: Craig Whitehead MRN: [id number] DOB: Jan 14, 1928 Today's Date: 02/26/2019   History of Present Illness  85 year old man who was admitted after a fall from bed and found to have acute kidney infection.  PMH:  permanent AFIb, DM, normal pressure hydrocephalus, bladder CA, CHF.  He reports that legs give out  Clinical Impression  Pt admitted as above and presenting with functional mobility limitations 2* generalized weakness, limited endurance and balance deficits.  Pt would benefit from follow up rehab at SNF level to maximize IND and safety.    Follow Up Recommendations SNF    Equipment Recommendations  None recommended by PT    Recommendations for Other Services       Precautions / Restrictions Precautions Precautions: Fall Restrictions Weight Bearing Restrictions: No      Mobility  Bed Mobility Overal bed mobility: Needs Assistance Bed Mobility: Supine to Sit     Supine to sit: Min assist;Mod assist        Transfers Overall transfer level: Needs assistance Equipment used: Rolling walker (2 wheeled) Transfers: Sit to/from Stand Sit to Stand: Min assist;+2 safety/equipment Stand pivot transfers: Min assist;Mod assist;+2 physical assistance;+2 safety/equipment       General transfer comment: assist to rise and steady  Ambulation/Gait Ambulation/Gait assistance: Min assist;Mod assist;+2 safety/equipment Gait Distance (Feet): 7 Feet Assistive device: Rolling walker (2 wheeled) Gait Pattern/deviations: Shuffle;Decreased step length - right;Decreased step length - left;Step-to pattern;Trunk flexed Gait velocity: decr   General Gait Details: cues for posture and position from RW; Pt unstable on feet even with RW and with knees buckling with increased fatigue.  Stairs            Wheelchair Mobility    Modified Rankin (Stroke Patients Only)       Balance Overall balance assessment: Needs assistance    Sitting balance-Leahy Scale: Poor Sitting balance - Comments: tended to lose balance posterior   Standing balance support: Bilateral upper extremity supported Standing balance-Leahy Scale: Poor                               Pertinent Vitals/Pain Pain Assessment: Faces Faces Pain Scale: Hurts little more Pain Location: R heel Pain Descriptors / Indicators: Sore Pain Intervention(s): Limited activity within patient's tolerance;Monitored during session    Home Living Family/patient expects to be discharged to:: Skilled nursing facility Living Arrangements: Alone                    Prior Function Level of Independence: Independent         Comments: unsure. Pt states people brought him food but he could mobilize himself     Hand Dominance   Dominant Hand: Right    Extremity/Trunk Assessment   Upper Extremity Assessment Upper Extremity Assessment: Generalized weakness    Lower Extremity Assessment Lower Extremity Assessment: Generalized weakness       Communication   Communication: HOH  Cognition Arousal/Alertness: Awake/alert Behavior During Therapy: WFL for tasks assessed/performed Overall Cognitive Status: No family/caregiver present to determine baseline cognitive functioning                                 General Comments: poor historian      General Comments      Exercises     Assessment/Plan    PT Assessment Patient needs continued  PT services  PT Problem List Decreased strength;Decreased range of motion;Decreased activity tolerance;Decreased mobility;Decreased knowledge of use of DME       PT Treatment Interventions DME instruction;Gait training;Functional mobility training;Therapeutic activities;Therapeutic exercise;Patient/family education    PT Goals (Current goals can be found in the Care Plan section)  Acute Rehab PT Goals Patient Stated Goal: none stated; agreeable to OOB PT Goal Formulation: With  patient Time For Goal Achievement: 03/12/19 Potential to Achieve Goals: Fair    Frequency Min 2X/week   Barriers to discharge Decreased caregiver support Home alone    Co-evaluation PT/OT/SLP Co-Evaluation/Treatment: Yes Reason for Co-Treatment: For patient/therapist safety PT goals addressed during session: Mobility/safety with mobility OT goals addressed during session: ADL's and self-care       AM-PAC PT "6 Clicks" Mobility  Outcome Measure Help needed turning from your back to your side while in a flat bed without using bedrails?: A Lot Help needed moving from lying on your back to sitting on the side of a flat bed without using bedrails?: A Lot Help needed moving to and from a bed to a chair (including a wheelchair)?: A Lot Help needed standing up from a chair using your arms (e.g., wheelchair or bedside chair)?: A Lot Help needed to walk in hospital room?: A Lot Help needed climbing 3-5 steps with a railing? : Total 6 Click Score: 11    End of Session Equipment Utilized During Treatment: Gait belt;Oxygen Activity Tolerance: Patient limited by fatigue Patient left: in chair;with call bell/phone within reach;with chair alarm set Nurse Communication: Mobility status PT Visit Diagnosis: Unsteadiness on feet (R26.81);Muscle weakness (generalized) (M62.81);Difficulty in walking, not elsewhere classified (R26.2);History of falling (Z91.81)    Time: 7262-0355 PT Time Calculation (min) (ACUTE ONLY): 33 min   Charges:   PT Evaluation $PT Eval Low Complexity: 1 Low          [place] [facility] Pager [phone] Office [phone]   Jeremiah Curci 02/26/2019, 4:26 PM

## 2019-02-26 NOTE — Evaluation (Signed)
Occupational Therapy Evaluation Patient Details Name: Craig Whitehead MRN: [id number] DOB: September 23, 1927 Today's Date: 02/26/2019    History of Present Illness 85 year old man who was admitted after a fall from bed and found to have acute kidney infection.  PMH:  permanent AFIb, DM, normal pressure hydrocephalus, bladder CA, CHF.  He reports that legs give out   Clinical Impression   Pt was admitted for the above. Pt lived alone but reports someone brought him food. He is a poor historian. Pt has decreased balance, both sitting and standing and a h/o legs giving out.  Will follow in acute setting with setup to min A level goals. He will need SNF as he doesn't have 24/7    Follow Up Recommendations  SNF    Equipment Recommendations  3 in 1 bedside commode    Recommendations for Other Services       Precautions / Restrictions Precautions Precautions: Fall Restrictions Weight Bearing Restrictions: No      Mobility Bed Mobility Overal bed mobility: Needs Assistance Bed Mobility: Supine to Sit     Supine to sit: Min assist;Mod assist        Transfers Overall transfer level: Needs assistance Equipment used: Rolling walker (2 wheeled) Transfers: Sit to/from Stand Sit to Stand: Min assist;+2 safety/equipment         General transfer comment: assist to rise and steady    Balance Overall balance assessment: Needs assistance   Sitting balance-Leahy Scale: Poor Sitting balance - Comments: tended to lose balance posterior     Standing balance-Leahy Scale: Poor                             ADL either performed or assessed with clinical judgement   ADL Overall ADL's : Needs assistance/impaired Eating/Feeding: Independent   Grooming: Set up   Upper Body Bathing: Set up   Lower Body Bathing: Maximal assistance;+2 for safety/equipment   Upper Body Dressing : Minimal assistance   Lower Body Dressing: Total assistance;+2 for safety/equipment   Toilet  Transfer: Minimal assistance;+2 for safety/equipment;Stand-pivot;BSC;RW(and chair)   Toileting- Clothing Manipulation and Hygiene: Total assistance;+2 for safety/equipment;Sit to/from stand         General ADL Comments: used commode and transferred to chair.  Moved well but extra time required     Vision         Perception     Praxis      Pertinent Vitals/Pain Pain Assessment: Faces Faces Pain Scale: Hurts little more Pain Location: R heel Pain Descriptors / Indicators: Sore Pain Intervention(s): Limited activity within patient's tolerance;Monitored during session;Repositioned     Hand Dominance     Extremity/Trunk Assessment Upper Extremity Assessment Upper Extremity Assessment: Generalized weakness           Communication Communication Communication: No difficulties   Cognition Arousal/Alertness: Awake/alert Behavior During Therapy: WFL for tasks assessed/performed Overall Cognitive Status: No family/caregiver present to determine baseline cognitive functioning                                 General Comments: poor historian   General Comments       Exercises     Shoulder Instructions      Home Living Family/patient expects to be discharged to:: Unsure Living Arrangements: Alone  Prior Functioning/Environment          Comments: unsure. Pt states people brought him food but he could mobilize himself        OT Problem List: Decreased strength;Decreased activity tolerance;Impaired balance (sitting and/or standing);Decreased cognition;Decreased safety awareness;Decreased knowledge of use of DME or AE;Pain      OT Treatment/Interventions: Self-care/ADL training;DME and/or AE instruction;Patient/family education;Balance training;Therapeutic activities    OT Goals(Current goals can be found in the care plan section) Acute Rehab OT Goals Patient Stated Goal: none stated; agreeable to  OOB OT Goal Formulation: With patient Time For Goal Achievement: 03/12/19 Potential to Achieve Goals: Good ADL Goals Pt Will Transfer to Toilet: with min guard assist;stand pivot transfer;bedside commode(and complete hygiene with min A) Additional ADL Goal #1: pt will complete UB adls with set up/supervision from sitting Additional ADL Goal #2: pt will complete LB adls with min A, sit to stand Additional ADL Goal #3: pt will will perform bed mobility with min guard in preparation for adls  OT Frequency: Min 2X/week   Barriers to D/C:            Co-evaluation PT/OT/SLP Co-Evaluation/Treatment: Yes Reason for Co-Treatment: For patient/therapist safety PT goals addressed during session: Mobility/safety with mobility OT goals addressed during session: ADL's and self-care      AM-PAC OT "6 Clicks" Daily Activity     Outcome Measure Help from another person eating meals?: None Help from another person taking care of personal grooming?: A Little Help from another person toileting, which includes using toliet, bedpan, or urinal?: Total Help from another person bathing (including washing, rinsing, drying)?: A Lot Help from another person to put on and taking off regular upper body clothing?: A Little Help from another person to put on and taking off regular lower body clothing?: Total 6 Click Score: 14   End of Session Nurse Communication: Mobility status  Activity Tolerance: Patient tolerated treatment well Patient left: in chair;with call bell/phone within reach;with chair alarm set  OT Visit Diagnosis: Unsteadiness on feet (R26.81);Muscle weakness (generalized) (M62.81);History of falling (Z91.81)                Time: 7035-0093 OT Time Calculation (min): 33 min Charges:  OT General Charges $OT Visit: 1 Visit OT Evaluation $OT Eval Low Complexity: [place], OTR/L Acute Rehabilitation Services [phone] [facility] pager [phone]  office 02/26/2019  Craig Whitehead 02/26/2019, 4:02 PM

## 2019-02-26 NOTE — Progress Notes (Signed)
[facility]  PROGRESS NOTE  Craig Whitehead CBJ:[id number] DOB: [birth year] DOA: 02/25/2019 PCP: Josetta Huddle, MD   Brief HPI:   85 year old male with history of permanent atrial fibrillation, type 2 diabetes mellitus, normal pressure hydrocephalus, BPH, history of bladder cancer presented to ED after falling off his bed with generalized weakness.  This was associated with bowel incontinence and poor oral intake.  Patient lives alone.  Has 3 children who live in the area but does not have relationship with him.  Denies any fever.  Feels depressed but no suicidal or homicidal ideation.  Patient was found to be in acute kidney injury, started on IV fluids.  CT head, cervical spine lumbar spine unremarkable for acute findings.  Also found to be in A. fib with RVR.    Subjective   This morning patient denies any chest pain or shortness of breath.  No nausea or vomiting.   Assessment/Plan:     1. Acute kidney injury-patient baseline creatinine 1.78, stage III chronic kidney disease.  Presented with creatinine of 3.45, BUN 88.  Also found to be in high anion gap metabolic acidosis with bicarb 11.  Started on bicarb infusion at 75 mill per hour.  Creatinine has improved to 3.03, bicarb is up to 18.  Will repeat BMP today.  2. Anion gap metabolic acidosis-patient presented with anion gap metabolic acidosis, likely from renal failure as above.  Slowly improving after IV hydration.  Anion gap has closed and is currently at 10.  Will follow BMP.  3. Atrial fibrillation with RVR-patient was given Lopressor 2.5 mg every 4 hours PRN.  Patient is currently not on anticoagulation, patient is not taking any AV nodal blocking agents due to history of bradycardia.  He is on aspirin at home.  We will continue with aspirin 81 mg p.o. daily.  4. Type 2 diabetes mellitus-currently on sliding scale insulin with NovoLog.  CBG well controlled.  5. QTC prolongation-QTC prolonged to 511.  Currently on cardiac  monitoring.  Repeat EKG this morning showed QTC 440.  6. Failure to thrive-PT OT consulted.    CBG: Recent Labs  Lab 02/25/19 1545 02/25/19 2250 02/26/19 0754 02/26/19 1154  GLUCAP 133* 200* 131* 172*    CBC: Recent Labs  Lab 02/25/19 1605  WBC 9.8  NEUTROABS 8.2*  HGB 11.0*  HCT 35.9*  MCV 110.5*  PLT 160    Basic Metabolic Panel: Recent Labs  Lab 02/25/19 1605 02/26/19 0215  NA 144 139  K 5.3* 4.5  CL 115* 111  CO2 11* 18*  GLUCOSE 146* 204*  BUN 88* 83*  CREATININE 3.45* 3.03*  CALCIUM 9.2 8.0*  MG 2.1  --      DVT prophylaxis: Heparin  Code Status: Full code  Family Communication: No family at bedside  Disposition Plan: likely home when medically ready for discharge   Scheduled medications:  . Chlorhexidine Gluconate Cloth  6 each Topical Daily  . feeding supplement (GLUCERNA SHAKE)  237 mL Oral TID BM  . folic acid  1 mg Oral Daily  . heparin injection (subcutaneous)  5,000 Units Subcutaneous Q8H  . insulin aspart  0-5 Units Subcutaneous QHS  . insulin aspart  0-9 Units Subcutaneous TID WC  . mouth rinse  15 mL Mouth Rinse BID  . tamsulosin  0.4 mg Oral Daily  . thiamine  100 mg Oral Daily     Antibiotics:   Anti-infectives (From admission, onward)   None       Objective  Vitals:   02/26/19 0319 02/26/19 0430 02/26/19 0800 02/26/19 1200  BP:      Pulse:      Resp:      Temp: 98 F (36.7 C)  97.8 F (36.6 C) 97.6 F (36.4 C)  TempSrc: Axillary  Oral Oral  SpO2:      Weight:  55 kg    Height:        Intake/Output Summary (Last 24 hours) at 02/26/2019 1320 Last data filed at 02/26/2019 0300 Gross per 24 hour  Intake 522.94 ml  Output -  Net 522.94 ml   Filed Weights   02/25/19 2112 02/26/19 0430  Weight: 55 kg 55 kg     Physical Examination:    General: Appears in no acute distress  Cardiovascular: S1-S2, regular, no murmur auscultated  Respiratory: Clear to auscultation bilaterally  Abdomen: Abdomen is  soft, nontender, no organomegaly  Extremities: No edema in the lower extremities noted  Neurologic: Alert, oriented x3, intact insight and judgment, moving all extremities     Data Reviewed: I have personally reviewed following labs and imaging studies   No results found for this or any previous visit (from the past 240 hour(s)).   Liver Function Tests: Recent Labs  Lab 02/25/19 1605  AST 17  ALT 22  ALKPHOS 66  BILITOT 0.7  PROT 7.4  ALBUMIN 4.4   No results for input(s): LIPASE, AMYLASE in the last 168 hours. No results for input(s): AMMONIA in the last 168 hours.  Cardiac Enzymes: Recent Labs  Lab 02/25/19 1605  CKTOTAL 183   BNP (last 3 results) Recent Labs    02/25/19 1605  BNP 214.5*       Studies: Dg Chest 2 View  Result Date: 02/25/2019 CLINICAL DATA:  85 year old male status post fall at home today. EXAM: CHEST - 2 VIEW COMPARISON:  01/13/2019 and earlier. FINDINGS: Portable AP semi upright view at 1653 hours. Larger lung volumes. Tortuous descending thoracic aorta. Other mediastinal contours are within normal limits. Visualized tracheal air column is within normal limits. No pneumothorax, pulmonary edema, pleural effusion or acute pulmonary opacity. Right nipple shadow. No acute osseous abnormality identified. Negative visible bowel gas pattern. Chronic shunt catheter coursing through the right chest. IMPRESSION: 1. No acute cardiopulmonary abnormality or acute traumatic injury identified. 2. Chronically tortuous descending thoracic aorta. Electronically Signed   By: Genevie Ann M.D.   On: 02/25/2019 17:15   Ct Head Wo Contrast  Result Date: 02/25/2019 CLINICAL DATA:  Ground level fall now with altered mental status EXAM: CT HEAD WITHOUT CONTRAST CT CERVICAL SPINE WITHOUT CONTRAST TECHNIQUE: Multidetector CT imaging of the head and cervical spine was performed following the standard protocol without intravenous contrast. Multiplanar CT image reconstructions of  the cervical spine were also generated. COMPARISON:  Head CT January 13, 2019 FINDINGS: CT HEAD FINDINGS Brain: Redemonstration of a right frontal approach ventriculostomy shunt catheter with the tip projecting at the level of the right foramen of [place]. Positioning is unchanged from prior. The ventricular caliber is unchanged. No shunt discontinuity. Ventriculomegaly is on the background of more diffuse parenchymal atrophy. Patchy areas of white matter hypoattenuation are most compatible with chronic microvascular angiopathy. No evidence of acute infarction, hemorrhage, hydrocephalus, extra-axial collection or mass lesion/mass effect. Vascular: Atherosclerotic calcification of the carotid siphons. No hyperdense vessel or unexpected calcification. Skull: Right frontal scalp reservoir. Tubing courses along the right scalp to the soft tissues of the right neck. No calvarial fracture or suspicious osseous lesion. No  scalp swelling or hematoma. Sinuses/Orbits: Paranasal sinuses and mastoid air cells are predominantly clear. Orbital structures are unremarkable aside from prior lens extractions. Other: None CT CERVICAL SPINE FINDINGS Alignment: Slight straightening of the normal cervical lordosis on a degenerative basis. Skull base and vertebrae: No acute fracture or traumatic listhesis. No primary bone lesion or focal pathologic process. Soft tissues and spinal canal: No pre or paravertebral fluid or swelling. No visible canal hematoma. Disc levels: Multilevel intervertebral disc height loss with diffuse cervical spondylitic changes maximally from C5 through the included levels of the thoracic spine. Uncinate spurring and facet hypertrophic changes result in mild moderate multilevel foraminal stenoses but no severe spinal canal or foraminal stenosis is seen at any level in the imaged cervical spine. Upper chest: Apical is pleuroparenchymal scarring. No acute abnormality in the upper chest or imaged lung apices. Other:  Vascular calcium in the right carotid bifurcation IMPRESSION: 1. No acute intracranial abnormality. Chronic senescent changes similar to prior. 2. Stable positioning of a right frontal approach ventriculostomy shunt catheter and stable ventricular caliber. 3. No acute cervical spine fracture. 4. Multilevel degenerative cervical spondylosis without severe canal narrowing or foraminal stenosis. Electronically Signed   By: Lovena Le M.D.   On: 02/25/2019 17:05   Ct Cervical Spine Wo Contrast  Result Date: 02/25/2019 CLINICAL DATA:  Ground level fall now with altered mental status EXAM: CT HEAD WITHOUT CONTRAST CT CERVICAL SPINE WITHOUT CONTRAST TECHNIQUE: Multidetector CT imaging of the head and cervical spine was performed following the standard protocol without intravenous contrast. Multiplanar CT image reconstructions of the cervical spine were also generated. COMPARISON:  Head CT January 13, 2019 FINDINGS: CT HEAD FINDINGS Brain: Redemonstration of a right frontal approach ventriculostomy shunt catheter with the tip projecting at the level of the right foramen of Monro. Positioning is unchanged from prior. The ventricular caliber is unchanged. No shunt discontinuity. Ventriculomegaly is on the background of more diffuse parenchymal atrophy. Patchy areas of white matter hypoattenuation are most compatible with chronic microvascular angiopathy. No evidence of acute infarction, hemorrhage, hydrocephalus, extra-axial collection or mass lesion/mass effect. Vascular: Atherosclerotic calcification of the carotid siphons. No hyperdense vessel or unexpected calcification. Skull: Right frontal scalp reservoir. Tubing courses along the right scalp to the soft tissues of the right neck. No calvarial fracture or suspicious osseous lesion. No scalp swelling or hematoma. Sinuses/Orbits: Paranasal sinuses and mastoid air cells are predominantly clear. Orbital structures are unremarkable aside from prior lens extractions. Other:  None CT CERVICAL SPINE FINDINGS Alignment: Slight straightening of the normal cervical lordosis on a degenerative basis. Skull base and vertebrae: No acute fracture or traumatic listhesis. No primary bone lesion or focal pathologic process. Soft tissues and spinal canal: No pre or paravertebral fluid or swelling. No visible canal hematoma. Disc levels: Multilevel intervertebral disc height loss with diffuse cervical spondylitic changes maximally from C5 through the included levels of the thoracic spine. Uncinate spurring and facet hypertrophic changes result in mild moderate multilevel foraminal stenoses but no severe spinal canal or foraminal stenosis is seen at any level in the imaged cervical spine. Upper chest: Apical is pleuroparenchymal scarring. No acute abnormality in the upper chest or imaged lung apices. Other: Vascular calcium in the right carotid bifurcation IMPRESSION: 1. No acute intracranial abnormality. Chronic senescent changes similar to prior. 2. Stable positioning of a right frontal approach ventriculostomy shunt catheter and stable ventricular caliber. 3. No acute cervical spine fracture. 4. Multilevel degenerative cervical spondylosis without severe canal narrowing or foraminal stenosis. Electronically  Signed   By: Lovena Le M.D.   On: 02/25/2019 17:05   Ct Lumbar Spine Wo Contrast  Result Date: 02/25/2019 CLINICAL DATA:  Tenderness to palpation over L-spine after ground level fall EXAM: CT LUMBAR SPINE WITHOUT CONTRAST TECHNIQUE: Multidetector CT imaging of the lumbar spine was performed without intravenous contrast administration. Multiplanar CT image reconstructions were also generated. COMPARISON:  Lumbar radiographs 07/20/2018 FINDINGS: Segmentation: 5 lumbar type vertebrae. Alignment: Preservation of the normal lumbar lordosis without traumatic listhesis. No abnormal facet widening. Vertebrae: The bones are diffusely demineralized which may limit detection of subtle compression  deformity and nondisplaced fractures. No definite fracture or vertebral body height loss is evident. There is degenerative fusion of the L1-2 vertebral bodies. Large Schmorl's node formations are present in the endplates along the U3-8 disc space. Osteophyte formation is noted diffusely in the lumbar spine as are hypertrophic degenerative facet changes. Paraspinal and other soft tissues: No pre or paravertebral fluid or swelling. No visible canal hematoma. Disc levels: Multilevel intervertebral disc height loss with facet hypertrophic changes results in multilevel mild-to-moderate foraminal stenoses but no severe neural foraminal narrowing or significant spinal canal stenosis. Other: There is a small transcortical lucency through the left sacral ala. Could reflect a nondisplaced fracture/insufficiency fracture. Aortic atherosclerosis. Shunt catheter tubing in the low pelvis. Vascular calcifications in the renal hila. IMPRESSION: 1. The bones are diffusely demineralized which may limit detection of subtle compression deformity and nondisplaced fractures. No definite fracture or vertebral body height loss is evident. If there is persistent clinical concern for an acute fracture, consider MRI for further evaluation. 2. Question left sacral insufficiency fracture versus prominent vascular channel 3. Multilevel degenerative changes throughout the lumbar spine as described above. 4.  Aortic Atherosclerosis (ICD10-I70.0). Electronically Signed   By: Lovena Le M.D.   On: 02/25/2019 17:13     Admission status: Inpatient: Based on patients clinical presentation and evaluation of above clinical data, I have made determination that patient meets Inpatient criteria at this time.  Time spent: 25 min  [facility] Pager [phone]. If 7PM-7AM, please contact night-coverage at [email or web address], Office  [phone]  password [email or web address]  02/26/2019, 1:20 PM  LOS: 1 day

## 2019-02-27 ENCOUNTER — Inpatient Hospital Stay (HOSPITAL_COMMUNITY): Payer: Medicare HMO

## 2019-02-27 DIAGNOSIS — I5031 Acute diastolic (congestive) heart failure: Secondary | ICD-10-CM

## 2019-02-27 LAB — BASIC METABOLIC PANEL WITH GFR
Anion gap: 10 (ref 5–15)
BUN: 67 mg/dL — ABNORMAL HIGH (ref 8–23)
CO2: 21 mmol/L — ABNORMAL LOW (ref 22–32)
Calcium: 7.1 mg/dL — ABNORMAL LOW (ref 8.9–10.3)
Chloride: 105 mmol/L (ref 98–111)
Creatinine, Ser: 2.42 mg/dL — ABNORMAL HIGH (ref 0.61–1.24)
GFR calc Af Amer: 26 mL/min — ABNORMAL LOW
GFR calc non Af Amer: 23 mL/min — ABNORMAL LOW
Glucose, Bld: 149 mg/dL — ABNORMAL HIGH (ref 70–99)
Potassium: 4.3 mmol/L (ref 3.5–5.1)
Sodium: 136 mmol/L (ref 135–145)

## 2019-02-27 LAB — CBC
HCT: 21.9 % — ABNORMAL LOW (ref 39.0–52.0)
HCT: 24.5 % — ABNORMAL LOW (ref 39.0–52.0)
Hemoglobin: 7.3 g/dL — ABNORMAL LOW (ref 13.0–17.0)
Hemoglobin: 8 g/dL — ABNORMAL LOW (ref 13.0–17.0)
MCH: 34.9 pg — ABNORMAL HIGH (ref 26.0–34.0)
MCH: 34.2 pg — ABNORMAL HIGH (ref 26.0–34.0)
MCHC: 33.3 g/dL (ref 30.0–36.0)
MCHC: 32.7 g/dL (ref 30.0–36.0)
MCV: 104.8 fL — ABNORMAL HIGH (ref 80.0–100.0)
MCV: 104.7 fL — ABNORMAL HIGH (ref 80.0–100.0)
Platelets: 136 10*3/uL — ABNORMAL LOW (ref 150–400)
Platelets: 133 10*3/uL — ABNORMAL LOW (ref 150–400)
RBC: 2.09 MIL/uL — ABNORMAL LOW (ref 4.22–5.81)
RBC: 2.34 MIL/uL — ABNORMAL LOW (ref 4.22–5.81)
RDW: 14.4 % (ref 11.5–15.5)
RDW: 14.6 % (ref 11.5–15.5)
WBC: 4.4 10*3/uL (ref 4.0–10.5)
WBC: 11.8 10*3/uL — ABNORMAL HIGH (ref 4.0–10.5)
nRBC: 0 % (ref 0.0–0.2)
nRBC: 0 % (ref 0.0–0.2)

## 2019-02-27 LAB — GLUCOSE, CAPILLARY
Glucose-Capillary: 173 mg/dL — ABNORMAL HIGH (ref 70–99)
Glucose-Capillary: 214 mg/dL — ABNORMAL HIGH (ref 70–99)
Glucose-Capillary: 147 mg/dL — ABNORMAL HIGH (ref 70–99)
Glucose-Capillary: 132 mg/dL — ABNORMAL HIGH (ref 70–99)

## 2019-02-27 LAB — ECHOCARDIOGRAM COMPLETE
Height: 67 in
Weight: 2031.76 oz

## 2019-02-27 LAB — URINE CULTURE
Culture: <10000 — AB
Special Requests: NORMAL

## 2019-02-27 LAB — SARS CORONAVIRUS 2 BY RT PCR (HOSPITAL ORDER, PERFORMED IN ~~LOC~~ HOSPITAL LAB): SARS Coronavirus 2: NEGATIVE

## 2019-02-27 NOTE — Progress Notes (Signed)
Physical Therapy Treatment Patient Details Name: Craig Whitehead MRN: [id number] DOB: February 20, 1928 Today's Date: 02/27/2019    History of Present Illness 85 year old man who was admitted after a fall from bed and found to have acute kidney infection.  PMH:  permanent AFIb, DM, normal pressure hydrocephalus, bladder CA, CHF.  He reports that legs give out    PT Comments    Pt cooperative but with limited endurance and requiring increased time for all tasks.  Slight improvement noted with activity tolerance and balance throughout session.   Follow Up Recommendations  SNF     Equipment Recommendations  None recommended by PT    Recommendations for Other Services       Precautions / Restrictions Precautions Precautions: Fall Restrictions Weight Bearing Restrictions: No    Mobility  Bed Mobility Overal bed mobility: Needs Assistance Bed Mobility: Supine to Sit     Supine to sit: Min assist;Mod assist     General bed mobility comments: Increased time with physical assist to roll, bring trunk to upright and complete transition to EOB with use of pad  Transfers Overall transfer level: Needs assistance Equipment used: Rolling walker (2 wheeled) Transfers: Sit to/from Stand Sit to Stand: Min assist;+2 safety/equipment         General transfer comment: assist to rise and steady  Ambulation/Gait Ambulation/Gait assistance: Min assist;Mod assist;+2 safety/equipment Gait Distance (Feet): 8 Feet Assistive device: Rolling walker (2 wheeled) Gait Pattern/deviations: Shuffle;Decreased step length - right;Decreased step length - left;Step-to pattern;Trunk flexed Gait velocity: decr       Stairs             Wheelchair Mobility    Modified Rankin (Stroke Patients Only)       Balance Overall balance assessment: Needs assistance Sitting-balance support: No upper extremity supported;Feet supported Sitting balance-Leahy Scale: Fair     Standing balance support:  Bilateral upper extremity supported Standing balance-Leahy Scale: Poor                              Cognition Arousal/Alertness: Awake/alert Behavior During Therapy: WFL for tasks assessed/performed Overall Cognitive Status: No family/caregiver present to determine baseline cognitive functioning                                 General Comments: poor historian      Exercises      General Comments        Pertinent Vitals/Pain Pain Assessment: Faces Faces Pain Scale: Hurts even more Pain Location: R heel to touch Pain Descriptors / Indicators: Sore Pain Intervention(s): Limited activity within patient's tolerance;Monitored during session    Home Living                      Prior Function            PT Goals (current goals can now be found in the care plan section) Acute Rehab PT Goals Patient Stated Goal: none stated; agreeable to OOB PT Goal Formulation: With patient Time For Goal Achievement: 03/12/19 Potential to Achieve Goals: Fair Progress towards PT goals: Progressing toward goals    Frequency    Min 2X/week      PT Plan Current plan remains appropriate    Co-evaluation              AM-PAC PT "6 Clicks" Mobility   Outcome  Measure  Help needed turning from your back to your side while in a flat bed without using bedrails?: A Lot Help needed moving from lying on your back to sitting on the side of a flat bed without using bedrails?: A Lot Help needed moving to and from a bed to a chair (including a wheelchair)?: A Lot Help needed standing up from a chair using your arms (e.g., wheelchair or bedside chair)?: A Lot Help needed to walk in hospital room?: A Lot Help needed climbing 3-5 steps with a railing? : Total 6 Click Score: 11    End of Session Equipment Utilized During Treatment: Gait belt Activity Tolerance: Patient limited by fatigue Patient left: in chair;with call bell/phone within reach;with chair  alarm set Nurse Communication: Mobility status;Other (comment)(sore right heel) PT Visit Diagnosis: Unsteadiness on feet (R26.81);Muscle weakness (generalized) (M62.81);Difficulty in walking, not elsewhere classified (R26.2);History of falling (Z91.81)     Time: 0301-3143 PT Time Calculation (min) (ACUTE ONLY): 20 min  Charges:  $Gait Training: 8-22 mins                     [place] Pager [phone] Office [phone]    Heyden Jaber 02/27/2019, 1:14 PM

## 2019-02-27 NOTE — NC FL2 (Addendum)
[place] MEDICAID FL2 LEVEL OF CARE SCREENING TOOL     IDENTIFICATION  Patient Name: Craig Whitehead Birthdate: Aug 08, 1927 Sex: male Admission Date (Current Location): 02/25/2019  [facility] and [place] Number:  Herbalist and Address:  [facility],  [place] [address], [place]      Provider Number: [id number]  Attending Physician Name and Address:  Oswald Hillock, MD  Relative Name and Phone Number:       Current Level of Care: Hospital Recommended Level of Care: [place] Prior Approval Number:    Date Approved/Denied:   PASRR Number: [id number]  Discharge Plan: SNF    Current Diagnoses: Patient Active Problem List   Diagnosis Date Noted  . Pressure injury of skin 04/01/2017  . Protein-calorie malnutrition, severe 03/29/2017  . Hyperglycemia due to type 2 diabetes mellitus ([place]) 03/29/2017  . Acute kidney injury superimposed on CKD ([place]) 03/29/2017  . Leukocytosis 03/29/2017  . Chronic systolic CHF (congestive heart failure) ([place]) 03/29/2017  . NPH (normal pressure hydrocephalus) ([place]) 03/29/2017  . BPH (benign prostatic hyperplasia) 03/29/2017  . History of bladder cancer 03/29/2017  . Homelessness 03/29/2017  . AKI (acute kidney injury) ([place])   . Chronic atrial fibrillation   . Weakness 03/28/2017  . Persistent atrial fibrillation ([place]): CHA2DS2Vasc 2 (age). Rate controlled without medications asymptomatic 06/02/2016  . Medication management 05/31/2016  . Major depressive disorder, recurrent severe without psychotic features ([place]) 05/13/2016    Orientation RESPIRATION BLADDER Height & Weight     Self, Time, Situation, Place  Normal Continent Weight: 126 lb 15.8 oz (57.6 kg) Height:  5\' 7"  (170.2 cm)  BEHAVIORAL SYMPTOMS/MOOD NEUROLOGICAL BOWEL NUTRITION STATUS      Continent Diet(see dc summary)  AMBULATORY STATUS COMMUNICATION OF NEEDS Skin   Extensive Assist Verbally Normal                        Personal Care Assistance Level of Assistance  Bathing, Feeding, Dressing Bathing Assistance: Limited assistance Feeding assistance: Independent Dressing Assistance: Limited assistance     Functional Limitations Info  Sight, Hearing, Speech Sight Info: Adequate Hearing Info: Adequate Speech Info: Adequate    SPECIAL CARE FACTORS FREQUENCY  PT (By licensed PT), OT (By licensed OT)     PT Frequency: 5x/week OT Frequency: 5x/week            Contractures      Additional Factors Info  Code Status, Allergies Code Status Info: Full Allergies Info: NKA           Current Medications (02/27/2019):  This is the current hospital active medication list Current Facility-Administered Medications  Medication Dose Route Frequency Provider Last Rate Last Dose  . acetaminophen (TYLENOL) tablet 650 mg  650 mg Oral Q6H PRN Irene Pap N, DO      . aspirin EC tablet 81 mg  81 mg Oral Daily Oswald Hillock, MD   81 mg at 02/27/19 0953  . Chlorhexidine Gluconate Cloth 2 % PADS 6 each  6 each Topical Daily Oswald Hillock, MD   6 each at 02/26/19 2118  . feeding supplement (GLUCERNA SHAKE) (GLUCERNA SHAKE) liquid 237 mL  237 mL Oral TID BM Hall, Carole N, DO   237 mL at 02/27/19 1440  . folic acid (FOLVITE) tablet 1 mg  1 mg Oral Daily [place], Carole N, DO   1 mg at 02/27/19 0954  . heparin injection 5,000 Units  5,000 Units Subcutaneous  [address] N, DO   5,000 Units at 02/27/19 1440  . insulin aspart (novoLOG) injection 0-5 Units  0-5 Units Subcutaneous QHS Hall, Carole N, DO      . insulin aspart (novoLOG) injection 0-9 Units  0-9 Units Subcutaneous TID WC Irene Pap N, DO   3 Units at 02/27/19 1247  . MEDLINE mouth rinse  15 mL Mouth Rinse BID Irene Pap N, DO   15 mL at 02/27/19 0955  . metoprolol tartrate (LOPRESSOR) injection 2.5 mg  2.5 mg Intravenous Q4H PRN Irene Pap N, DO      . ondansetron (ZOFRAN) injection 4 mg  4 mg Intravenous Q6H PRN Irene Pap N, DO      .  tamsulosin (FLOMAX) capsule 0.4 mg  0.4 mg Oral Daily [place], Carole N, DO   0.4 mg at 02/27/19 0954  . thiamine (VITAMIN B-1) tablet 100 mg  100 mg Oral Daily Irene Pap N, DO   100 mg at 02/27/19 4782     Discharge Medications: Please see discharge summary for a list of discharge medications.  Relevant Imaging Results:  Relevant Lab Results:   Additional Information SSN: [place], LCSW

## 2019-02-27 NOTE — Progress Notes (Signed)
[facility]  PROGRESS NOTE  Craig Whitehead UYQ:[id number] DOB: Nov 02, 1927 DOA: 02/25/2019 PCP: Josetta Huddle, MD   Brief HPI:   85 year old male with history of permanent atrial fibrillation, type 2 diabetes mellitus, normal pressure hydrocephalus, BPH, history of bladder cancer presented to ED after falling off his bed with generalized weakness.  This was associated with bowel incontinence and poor oral intake.  Patient lives alone.  Has 3 children who live in the area but does not have relationship with him.  Denies any fever.  Feels depressed but no suicidal or homicidal ideation.  Patient was found to be in acute kidney injury, started on IV fluids.  CT head, cervical spine lumbar spine unremarkable for acute findings.  Also found to be in A. fib with RVR.    Subjective   Patient seen and examined, denies chest pain or shortness of breath.  Hemoglobin dropped from 11-7.3 in 2 days.  No signs of bleeding.  Renal function slowly improving.   Assessment/Plan:     1. Acute kidney injury-patient baseline creatinine 1.78, stage III chronic kidney disease.  Presented with creatinine of 3.45, BUN 88.  Also found to be in high anion gap metabolic acidosis with bicarb 11.  Started on bicarb infusion at 75 mill per hour.  Creatinine has improved to 2.42, bicarb is 21.  We will continue with bicarb drip, follow BMP in a.m.  Anion gap is 10.   2. Anion gap metabolic acidosis-improved, patient presented with anion gap metabolic acidosis, likely from renal failure as above.  Slowly improving after IV hydration.  Anion gap has closed and is currently at 10.  Will follow BMP.  3. Atrial fibrillation with RVR-patient was given Lopressor 2.5 mg every 4 hours PRN.  Patient is currently not on anticoagulation, patient is not taking any AV nodal blocking agents due to history of bradycardia.  He is on aspirin at home.  We will continue with aspirin 81 mg p.o. daily.  4. Type 2 diabetes  mellitus-currently on sliding scale insulin with NovoLog.  CBG well controlled.  5. QTC prolongation-QTC prolonged to 511.  Currently on cardiac monitoring.  Repeat EKG showed QTC 440.  6. Anemia of chronic disease-patient has baseline hemoglobin of 9-10.  Hemoglobin dropped from 11-7.3 this morning, repeat CBC showed hemoglobin of 8.0.    Likely dilutional.  Also check FOBT.  Follow CBC in a.m.  7. Failure to thrive-PT OT consulted.    CBG: Recent Labs  Lab 02/26/19 0754 02/26/19 1154 02/26/19 1628 02/26/19 2111 02/27/19 0819  GLUCAP 131* 172* 194* 158* 173*    CBC: Recent Labs  Lab 02/25/19 1605 02/27/19 0233 02/27/19 1002  WBC 9.8 4.4 11.8*  NEUTROABS 8.2*  --   --   HGB 11.0* 7.3* 8.0*  HCT 35.9* 21.9* 24.5*  MCV 110.5* 104.8* 104.7*  PLT 256 136* 133*    Basic Metabolic Panel: Recent Labs  Lab 02/25/19 1605 02/26/19 0215 02/26/19 1332 02/27/19 0233  NA 144 139 138 136  K 5.3* 4.5 4.3 4.3  CL 115* 111 109 105  CO2 11* 18* 19* 21*  GLUCOSE 146* 204* 205* 149*  BUN 88* 83* 73* 67*  CREATININE 3.45* 3.03* 2.79* 2.42*  CALCIUM 9.2 8.0* 7.6* 7.1*  MG 2.1  --   --   --      DVT prophylaxis: Heparin  Code Status: Full code  Family Communication: No family at bedside  Disposition Plan: likely home when medically ready for discharge  Scheduled medications:  . aspirin EC  81 mg Oral Daily  . Chlorhexidine Gluconate Cloth  6 each Topical Daily  . feeding supplement (GLUCERNA SHAKE)  237 mL Oral TID BM  . folic acid  1 mg Oral Daily  . heparin injection (subcutaneous)  5,000 Units Subcutaneous Q8H  . insulin aspart  0-5 Units Subcutaneous QHS  . insulin aspart  0-9 Units Subcutaneous TID WC  . mouth rinse  15 mL Mouth Rinse BID  . tamsulosin  0.4 mg Oral Daily  . thiamine  100 mg Oral Daily     Antibiotics:   Anti-infectives (From admission, onward)   None       Objective   Vitals:   02/27/19 0500 02/27/19 0600 02/27/19 0633 02/27/19  0800  BP:  (!) 95/55 130/63   Pulse: 70 (!) 104 66   Resp: (!) 6 11 (!) 21   Temp:    98.6 F (37 C)  TempSrc:    Oral  SpO2: 100% 99% 97%   Weight:      Height:        Intake/Output Summary (Last 24 hours) at 02/27/2019 1115 Last data filed at 02/27/2019 0900 Gross per 24 hour  Intake 2455.43 ml  Output 1775 ml  Net 680.43 ml   Filed Weights   02/25/19 2112 02/26/19 0430 02/27/19 0451  Weight: 55 kg 55 kg 57.6 kg     Physical Examination:  General-appears in no acute distress Heart-S1-S2, regular, no murmur auscultated Lungs-clear to auscultation bilaterally, no wheezing or crackles auscultated Abdomen-soft, nontender, no organomegaly Extremities-no edema in the lower extremities Neuro-alert, oriented x3, no focal deficit noted  Data Reviewed: I have personally reviewed following labs and imaging studies   Recent Results (from the past 240 hour(s))  Culture, Urine     Status: Abnormal   Collection Time: 02/26/19  5:04 AM   Specimen: Urine, Clean Catch  Result Value Ref Range Status   Specimen Description   Final    URINE, CLEAN CATCH Performed at [facility], [place] [address], [place], [place] [address]    Special Requests   Final    Normal Performed at [facility], [place] [address], [place], [place] [address]    Culture (A)  Final    <10,000 COLONIES/mL INSIGNIFICANT GROWTH Performed at [place] [address] [place], [place] [address]    Report Status 02/27/2019 FINAL  Final  MRSA PCR Screening     Status: None   Collection Time: 02/26/19  8:45 PM   Specimen: Nasopharyngeal  Result Value Ref Range Status   MRSA by PCR NEGATIVE NEGATIVE Final    Comment:        The GeneXpert MRSA Assay (FDA approved for NASAL specimens only), is one component of a comprehensive MRSA colonization surveillance program. It is not intended to diagnose MRSA infection nor to guide or monitor treatment for MRSA  infections. Performed at [facility], [place] [address] [place], [place] [address]   SARS Coronavirus 2 [facility] order, Performed in [facility] hospital lab)     Status: None   Collection Time: 02/26/19 11:28 PM  Result Value Ref Range Status   SARS Coronavirus 2 NEGATIVE NEGATIVE Final    Comment: (NOTE) If result is NEGATIVE SARS-CoV-2 target nucleic acids are NOT DETECTED. The SARS-CoV-2 RNA is generally detectable in upper and lower  respiratory specimens during the acute phase of infection. The lowest  concentration of SARS-CoV-2 viral  copies this assay can detect is 250  copies / mL. A negative result does not preclude SARS-CoV-2 infection  and should not be used as the sole basis for treatment or other  patient management decisions.  A negative result may occur with  improper specimen collection / handling, submission of specimen other  than nasopharyngeal swab, presence of viral mutation(s) within the  areas targeted by this assay, and inadequate number of viral copies  (<250 copies / mL). A negative result must be combined with clinical  observations, patient history, and epidemiological information. If result is POSITIVE SARS-CoV-2 target nucleic acids are DETECTED. The SARS-CoV-2 RNA is generally detectable in upper and lower  respiratory specimens dur ing the acute phase of infection.  Positive  results are indicative of active infection with SARS-CoV-2.  Clinical  correlation with patient history and other diagnostic information is  necessary to determine patient infection status.  Positive results do  not rule out bacterial infection or co-infection with other viruses. If result is PRESUMPTIVE POSTIVE SARS-CoV-2 nucleic acids MAY BE PRESENT.   A presumptive positive result was obtained on the submitted specimen  and confirmed on repeat testing.  While 2019 novel coronavirus  (SARS-CoV-2) nucleic acids may be present in the submitted sample   additional confirmatory testing may be necessary for epidemiological  and / or clinical management purposes  to differentiate between  SARS-CoV-2 and other Sarbecovirus currently known to infect humans.  If clinically indicated additional testing with an alternate test  methodology [phone]) is advised. The SARS-CoV-2 RNA is generally  detectable in upper and lower respiratory sp ecimens during the acute  phase of infection. The expected result is Negative. Fact Sheet for Patients:  StrictlyIdeas.no Fact Sheet for Healthcare Providers: BankingDealers.co.za This test is not yet approved or cleared by the [place] FDA and has been authorized for detection and/or diagnosis of SARS-CoV-2 by FDA under an Emergency Use Authorization (EUA).  This EUA will remain in effect (meaning this test can be used) for the duration of the COVID-19 declaration under Section 564(b)(1) of the Act, 21 U.S.C. section 360bbb-3(b)(1), unless the authorization is terminated or revoked sooner. Performed at [facility], [place] [address] [place], [place] [address]      Liver Function Tests: Recent Labs  Lab 02/25/19 1605  AST 17  ALT 22  ALKPHOS 66  BILITOT 0.7  PROT 7.4  ALBUMIN 4.4   No results for input(s): LIPASE, AMYLASE in the last 168 hours. No results for input(s): AMMONIA in the last 168 hours.  Cardiac Enzymes: Recent Labs  Lab 02/25/19 1605  CKTOTAL 183   BNP (last 3 results) Recent Labs    02/25/19 1605  BNP 214.5*       Studies: Dg Chest 2 View  Result Date: 02/25/2019 CLINICAL DATA:  85 year old male status post fall at home today. EXAM: CHEST - 2 VIEW COMPARISON:  01/13/2019 and earlier. FINDINGS: Portable AP semi upright view at 1653 hours. Larger lung volumes. Tortuous descending thoracic aorta. Other mediastinal contours are within normal limits. Visualized tracheal air column is within normal limits.  No pneumothorax, pulmonary edema, pleural effusion or acute pulmonary opacity. Right nipple shadow. No acute osseous abnormality identified. Negative visible bowel gas pattern. Chronic shunt catheter coursing through the right chest. IMPRESSION: 1. No acute cardiopulmonary abnormality or acute traumatic injury identified. 2. Chronically tortuous descending thoracic aorta. Electronically Signed   By: Genevie Ann M.D.   On: 02/25/2019 17:15   Ct Head Wo  Contrast  Result Date: 02/25/2019 CLINICAL DATA:  Ground level fall now with altered mental status EXAM: CT HEAD WITHOUT CONTRAST CT CERVICAL SPINE WITHOUT CONTRAST TECHNIQUE: Multidetector CT imaging of the head and cervical spine was performed following the standard protocol without intravenous contrast. Multiplanar CT image reconstructions of the cervical spine were also generated. COMPARISON:  Head CT January 13, 2019 FINDINGS: CT HEAD FINDINGS Brain: Redemonstration of a right frontal approach ventriculostomy shunt catheter with the tip projecting at the level of the right foramen of [place]. Positioning is unchanged from prior. The ventricular caliber is unchanged. No shunt discontinuity. Ventriculomegaly is on the background of more diffuse parenchymal atrophy. Patchy areas of white matter hypoattenuation are most compatible with chronic microvascular angiopathy. No evidence of acute infarction, hemorrhage, hydrocephalus, extra-axial collection or mass lesion/mass effect. Vascular: Atherosclerotic calcification of the carotid siphons. No hyperdense vessel or unexpected calcification. Skull: Right frontal scalp reservoir. Tubing courses along the right scalp to the soft tissues of the right neck. No calvarial fracture or suspicious osseous lesion. No scalp swelling or hematoma. Sinuses/Orbits: Paranasal sinuses and mastoid air cells are predominantly clear. Orbital structures are unremarkable aside from prior lens extractions. Other: None CT CERVICAL SPINE FINDINGS  Alignment: Slight straightening of the normal cervical lordosis on a degenerative basis. Skull base and vertebrae: No acute fracture or traumatic listhesis. No primary bone lesion or focal pathologic process. Soft tissues and spinal canal: No pre or paravertebral fluid or swelling. No visible canal hematoma. Disc levels: Multilevel intervertebral disc height loss with diffuse cervical spondylitic changes maximally from C5 through the included levels of the thoracic spine. Uncinate spurring and facet hypertrophic changes result in mild moderate multilevel foraminal stenoses but no severe spinal canal or foraminal stenosis is seen at any level in the imaged cervical spine. Upper chest: Apical is pleuroparenchymal scarring. No acute abnormality in the upper chest or imaged lung apices. Other: Vascular calcium in the right carotid bifurcation IMPRESSION: 1. No acute intracranial abnormality. Chronic senescent changes similar to prior. 2. Stable positioning of a right frontal approach ventriculostomy shunt catheter and stable ventricular caliber. 3. No acute cervical spine fracture. 4. Multilevel degenerative cervical spondylosis without severe canal narrowing or foraminal stenosis. Electronically Signed   By: Lovena Le M.D.   On: 02/25/2019 17:05   Ct Cervical Spine Wo Contrast  Result Date: 02/25/2019 CLINICAL DATA:  Ground level fall now with altered mental status EXAM: CT HEAD WITHOUT CONTRAST CT CERVICAL SPINE WITHOUT CONTRAST TECHNIQUE: Multidetector CT imaging of the head and cervical spine was performed following the standard protocol without intravenous contrast. Multiplanar CT image reconstructions of the cervical spine were also generated. COMPARISON:  Head CT January 13, 2019 FINDINGS: CT HEAD FINDINGS Brain: Redemonstration of a right frontal approach ventriculostomy shunt catheter with the tip projecting at the level of the right foramen of Monro. Positioning is unchanged from prior. The ventricular  caliber is unchanged. No shunt discontinuity. Ventriculomegaly is on the background of more diffuse parenchymal atrophy. Patchy areas of white matter hypoattenuation are most compatible with chronic microvascular angiopathy. No evidence of acute infarction, hemorrhage, hydrocephalus, extra-axial collection or mass lesion/mass effect. Vascular: Atherosclerotic calcification of the carotid siphons. No hyperdense vessel or unexpected calcification. Skull: Right frontal scalp reservoir. Tubing courses along the right scalp to the soft tissues of the right neck. No calvarial fracture or suspicious osseous lesion. No scalp swelling or hematoma. Sinuses/Orbits: Paranasal sinuses and mastoid air cells are predominantly clear. Orbital structures are unremarkable aside from prior  lens extractions. Other: None CT CERVICAL SPINE FINDINGS Alignment: Slight straightening of the normal cervical lordosis on a degenerative basis. Skull base and vertebrae: No acute fracture or traumatic listhesis. No primary bone lesion or focal pathologic process. Soft tissues and spinal canal: No pre or paravertebral fluid or swelling. No visible canal hematoma. Disc levels: Multilevel intervertebral disc height loss with diffuse cervical spondylitic changes maximally from C5 through the included levels of the thoracic spine. Uncinate spurring and facet hypertrophic changes result in mild moderate multilevel foraminal stenoses but no severe spinal canal or foraminal stenosis is seen at any level in the imaged cervical spine. Upper chest: Apical is pleuroparenchymal scarring. No acute abnormality in the upper chest or imaged lung apices. Other: Vascular calcium in the right carotid bifurcation IMPRESSION: 1. No acute intracranial abnormality. Chronic senescent changes similar to prior. 2. Stable positioning of a right frontal approach ventriculostomy shunt catheter and stable ventricular caliber. 3. No acute cervical spine fracture. 4. Multilevel  degenerative cervical spondylosis without severe canal narrowing or foraminal stenosis. Electronically Signed   By: Lovena Le M.D.   On: 02/25/2019 17:05   Ct Lumbar Spine Wo Contrast  Result Date: 02/25/2019 CLINICAL DATA:  Tenderness to palpation over L-spine after ground level fall EXAM: CT LUMBAR SPINE WITHOUT CONTRAST TECHNIQUE: Multidetector CT imaging of the lumbar spine was performed without intravenous contrast administration. Multiplanar CT image reconstructions were also generated. COMPARISON:  Lumbar radiographs 07/20/2018 FINDINGS: Segmentation: 5 lumbar type vertebrae. Alignment: Preservation of the normal lumbar lordosis without traumatic listhesis. No abnormal facet widening. Vertebrae: The bones are diffusely demineralized which may limit detection of subtle compression deformity and nondisplaced fractures. No definite fracture or vertebral body height loss is evident. There is degenerative fusion of the L1-2 vertebral bodies. Large Schmorl's node formations are present in the endplates along the J8-2 disc space. Osteophyte formation is noted diffusely in the lumbar spine as are hypertrophic degenerative facet changes. Paraspinal and other soft tissues: No pre or paravertebral fluid or swelling. No visible canal hematoma. Disc levels: Multilevel intervertebral disc height loss with facet hypertrophic changes results in multilevel mild-to-moderate foraminal stenoses but no severe neural foraminal narrowing or significant spinal canal stenosis. Other: There is a small transcortical lucency through the left sacral ala. Could reflect a nondisplaced fracture/insufficiency fracture. Aortic atherosclerosis. Shunt catheter tubing in the low pelvis. Vascular calcifications in the renal hila. IMPRESSION: 1. The bones are diffusely demineralized which may limit detection of subtle compression deformity and nondisplaced fractures. No definite fracture or vertebral body height loss is evident. If there is  persistent clinical concern for an acute fracture, consider MRI for further evaluation. 2. Question left sacral insufficiency fracture versus prominent vascular channel 3. Multilevel degenerative changes throughout the lumbar spine as described above. 4.  Aortic Atherosclerosis (ICD10-I70.0). Electronically Signed   By: Lovena Le M.D.   On: 02/25/2019 17:13     Admission status: Inpatient: Based on patients clinical presentation and evaluation of above clinical data, I have made determination that patient meets Inpatient criteria at this time.  Time spent: 25 min  [facility] Pager [phone]. If 7PM-7AM, please contact night-coverage at [email or web address], Office  [phone]  password [email or web address]  02/27/2019, 11:15 AM  LOS: 2 days

## 2019-02-27 NOTE — NC FL2 (Signed)
[place] MEDICAID FL2 LEVEL OF CARE SCREENING TOOL     IDENTIFICATION  Patient Name: Craig Whitehead Birthdate: 1927-10-06 Sex: male Admission Date (Current Location): 02/25/2019  [facility] and [place] Number:  Herbalist and Address:  [facility],  [place] [address], [place]      Provider Number: [id number]  Attending Physician Name and Address:  Oswald Hillock, MD  Relative Name and Phone Number:       Current Level of Care: Hospital Recommended Level of Care: [place] Prior Approval Number:    Date Approved/Denied:   PASRR Number: [id number]  Discharge Plan: SNF    Current Diagnoses: Patient Active Problem List   Diagnosis Date Noted  . Pressure injury of skin 04/01/2017  . Protein-calorie malnutrition, severe 03/29/2017  . Hyperglycemia due to type 2 diabetes mellitus ([place]) 03/29/2017  . Acute kidney injury superimposed on CKD ([place]) 03/29/2017  . Leukocytosis 03/29/2017  . Chronic systolic CHF (congestive heart failure) ([place]) 03/29/2017  . NPH (normal pressure hydrocephalus) ([place]) 03/29/2017  . BPH (benign prostatic hyperplasia) 03/29/2017  . History of bladder cancer 03/29/2017  . Homelessness 03/29/2017  . AKI (acute kidney injury) ([place])   . Chronic atrial fibrillation   . Weakness 03/28/2017  . Persistent atrial fibrillation ([place]): CHA2DS2Vasc 2 (age). Rate controlled without medications asymptomatic 06/02/2016  . Medication management 05/31/2016  . Major depressive disorder, recurrent severe without psychotic features ([place]) 05/13/2016    Orientation RESPIRATION BLADDER Height & Weight     Self, Time, Situation, Place  Normal Continent Weight: 126 lb 15.8 oz (57.6 kg) Height:  5\' 7"  (170.2 cm)  BEHAVIORAL SYMPTOMS/MOOD NEUROLOGICAL BOWEL NUTRITION STATUS      Continent Diet(see dc summary)  AMBULATORY STATUS COMMUNICATION OF NEEDS Skin   Extensive Assist Verbally Normal                        Personal Care Assistance Level of Assistance  Bathing, Feeding, Dressing Bathing Assistance: Limited assistance Feeding assistance: Independent Dressing Assistance: Limited assistance     Functional Limitations Info  Sight, Hearing, Speech Sight Info: Adequate Hearing Info: Adequate Speech Info: Adequate    SPECIAL CARE FACTORS FREQUENCY  PT (By licensed PT), OT (By licensed OT)     PT Frequency: 5x/week OT Frequency: 5x/week            Contractures      Additional Factors Info  Code Status, Allergies Code Status Info: Full Allergies Info: NKA           Current Medications (02/27/2019):  This is the current hospital active medication list Current Facility-Administered Medications  Medication Dose Route Frequency Provider Last Rate Last Dose  . acetaminophen (TYLENOL) tablet 650 mg  650 mg Oral Q6H PRN Irene Pap N, DO      . aspirin EC tablet 81 mg  81 mg Oral Daily Oswald Hillock, MD   81 mg at 02/27/19 0953  . Chlorhexidine Gluconate Cloth 2 % PADS 6 each  6 each Topical Daily Oswald Hillock, MD   6 each at 02/26/19 2118  . feeding supplement (GLUCERNA SHAKE) (GLUCERNA SHAKE) liquid 237 mL  237 mL Oral TID BM Hall, Carole N, DO   237 mL at 02/27/19 1440  . folic acid (FOLVITE) tablet 1 mg  1 mg Oral Daily [place], Carole N, DO   1 mg at 02/27/19 0954  . heparin injection 5,000 Units  5,000 Units Subcutaneous  [address] N, DO   5,000 Units at 02/27/19 1440  . insulin aspart (novoLOG) injection 0-5 Units  0-5 Units Subcutaneous QHS Hall, Carole N, DO      . insulin aspart (novoLOG) injection 0-9 Units  0-9 Units Subcutaneous TID WC Irene Pap N, DO   3 Units at 02/27/19 1247  . MEDLINE mouth rinse  15 mL Mouth Rinse BID Irene Pap N, DO   15 mL at 02/27/19 0955  . metoprolol tartrate (LOPRESSOR) injection 2.5 mg  2.5 mg Intravenous Q4H PRN Irene Pap N, DO      . ondansetron (ZOFRAN) injection 4 mg  4 mg Intravenous Q6H PRN Irene Pap N, DO      .  tamsulosin (FLOMAX) capsule 0.4 mg  0.4 mg Oral Daily [place], Carole N, DO   0.4 mg at 02/27/19 0954  . thiamine (VITAMIN B-1) tablet 100 mg  100 mg Oral Daily Irene Pap N, DO   100 mg at 02/27/19 0488     Discharge Medications: Please see discharge summary for a list of discharge medications.  Relevant Imaging Results:  Relevant Lab Results:   Additional Information SSN: [place], LCSW

## 2019-02-27 NOTE — TOC Initial Note (Signed)
Transition of Care [facility]) - Initial/Assessment Note    Patient Details  Name: Craig Whitehead MRN: [id number] Date of Birth: 1928-01-17  Transition of Care [facility]) CM/SW Contact:    Servando Snare, LCSW Phone Number: 02/27/2019, 3:50 PM  Clinical Narrative:     Patient is from home alone and needs assistance with all ADLs. APS is currently involved. APS met with patient and encouraged him to go to SNF at dc. Home is not a safe dc. LCSW will continue to follow for disposition.   PLAN: SNF vs Home                Expected Discharge Plan: Skilled Nursing Facility Barriers to Discharge: Continued Medical Work up   Patient Goals and CMS Choice   CMS Medicare.gov Compare Post Acute Care list provided to:: Patient Choice offered to / list presented to : Patient  Expected Discharge Plan and Services Expected Discharge Plan: [place]     Post Acute Care Choice: NA                                        Prior Living Arrangements/Services   Lives with:: Self Patient language and need for interpreter reviewed:: Yes Do you feel safe going back to the place where you live?: Yes      Need for Family Participation in Patient Care: Yes (Comment) Care giver support system in place?: No (comment)   Criminal Activity/Legal Involvement Pertinent to Current Situation/Hospitalization: No - Comment as needed  Activities of Daily Living Home Assistive Devices/Equipment: None ADL Screening (condition at time of admission) Patient's cognitive ability adequate to safely complete daily activities?: Yes Is the patient deaf or have difficulty hearing?: No Does the patient have difficulty seeing, even when wearing glasses/contacts?: No Does the patient have difficulty concentrating, remembering, or making decisions?: No Patient able to express need for assistance with ADLs?: Yes Does the patient have difficulty dressing or bathing?: No Independently performs ADLs?:  No Does the patient have difficulty walking or climbing stairs?: Yes Weakness of Legs: Both Weakness of Arms/Hands: Both  Permission Sought/Granted Permission sought to share information with : Chartered certified accountant granted to share information with : Yes, Verbal Permission Granted              Emotional Assessment Appearance:: Appears stated age Attitude/Demeanor/Rapport: Unable to Assess Affect (typically observed): Calm Orientation: : Oriented to Self, Oriented to Place, Oriented to  Time, Oriented to Situation Alcohol / Substance Use: Not Applicable Psych Involvement: No (comment)  Admission diagnosis:  [place] Patient Active Problem List   Diagnosis Date Noted  . Pressure injury of skin 04/01/2017  . Protein-calorie malnutrition, severe 03/29/2017  . Hyperglycemia due to type 2 diabetes mellitus ([place]) 03/29/2017  . Acute kidney injury superimposed on CKD ([place]) 03/29/2017  . Leukocytosis 03/29/2017  . Chronic systolic CHF (congestive heart failure) ([place]) 03/29/2017  . NPH (normal pressure hydrocephalus) ([place]) 03/29/2017  . BPH (benign prostatic hyperplasia) 03/29/2017  . History of bladder cancer 03/29/2017  . Homelessness 03/29/2017  . AKI (acute kidney injury) ([place])   . Chronic atrial fibrillation   . Weakness 03/28/2017  . Persistent atrial fibrillation ([place]): CHA2DS2Vasc 2 (age). Rate controlled without medications asymptomatic 06/02/2016  . Medication management 05/31/2016  . Major depressive disorder, recurrent severe without psychotic features ([place]) 05/13/2016   PCP:  Josetta Huddle, MD Pharmacy:   [facility]  [place], [place] - [address] [address] Phone: [phone] Fax: [place] [address], [place] - [address] [place] [address] Phone: [phone] Fax: [phone]     Social Determinants of  Health (SDOH) Interventions    Readmission Risk Interventions No flowsheet data found.

## 2019-02-27 NOTE — Progress Notes (Signed)
Received notification from central telemetry patient had a 2.26 second pause on cardiac monitor. Dr. Darrick Meigs was notified

## 2019-02-27 NOTE — Progress Notes (Signed)
Pt's hgb was 11 on 8/6, not checked on 8/7 and this am on 8/8 it is 7.3. VS do not reflect this, no bleeding noted, urine clear, abd soft, HR normal. Will continue to monitor and report off for rounding MD to be consulted. Hoyle Barr, RN

## 2019-02-28 LAB — GLUCOSE, CAPILLARY
Glucose-Capillary: 271 mg/dL — ABNORMAL HIGH (ref 70–99)
Glucose-Capillary: 155 mg/dL — ABNORMAL HIGH (ref 70–99)
Glucose-Capillary: 237 mg/dL — ABNORMAL HIGH (ref 70–99)
Glucose-Capillary: 178 mg/dL — ABNORMAL HIGH (ref 70–99)

## 2019-02-28 LAB — BASIC METABOLIC PANEL
Anion gap: 8 (ref 5–15)
BUN: 67 mg/dL — ABNORMAL HIGH (ref 8–23)
CO2: 22 mmol/L (ref 22–32)
Calcium: 7.3 mg/dL — ABNORMAL LOW (ref 8.9–10.3)
Chloride: 104 mmol/L (ref 98–111)
Creatinine, Ser: 2.25 mg/dL — ABNORMAL HIGH (ref 0.61–1.24)
GFR calc Af Amer: 29 mL/min — ABNORMAL LOW (ref >60)
GFR calc non Af Amer: 25 mL/min — ABNORMAL LOW (ref >60)
Glucose, Bld: 168 mg/dL — ABNORMAL HIGH (ref 70–99)
Potassium: 4.5 mmol/L (ref 3.5–5.1)
Sodium: 134 mmol/L — ABNORMAL LOW (ref 135–145)

## 2019-02-28 LAB — CBC
HCT: 23.7 % — ABNORMAL LOW (ref 39.0–52.0)
Hemoglobin: 7.8 g/dL — ABNORMAL LOW (ref 13.0–17.0)
MCH: 34.5 pg — ABNORMAL HIGH (ref 26.0–34.0)
MCHC: 32.9 g/dL (ref 30.0–36.0)
MCV: 104.9 fL — ABNORMAL HIGH (ref 80.0–100.0)
Platelets: 131 10*3/uL — ABNORMAL LOW (ref 150–400)
RBC: 2.26 MIL/uL — ABNORMAL LOW (ref 4.22–5.81)
RDW: 14.4 % (ref 11.5–15.5)
WBC: 13.5 10*3/uL — ABNORMAL HIGH (ref 4.0–10.5)
nRBC: 0 % (ref 0.0–0.2)

## 2019-02-28 LAB — URINALYSIS, ROUTINE W REFLEX MICROSCOPIC
Bilirubin Urine: NEGATIVE
Glucose, UA: NEGATIVE mg/dL
Ketones, ur: NEGATIVE mg/dL
Nitrite: POSITIVE — AB
Protein, ur: NEGATIVE mg/dL
Specific Gravity, Urine: 1.009 (ref 1.005–1.030)
WBC, UA: >50 WBC/hpf — ABNORMAL HIGH (ref 0–5)
pH: 7 (ref 5.0–8.0)

## 2019-02-28 LAB — OCCULT BLOOD X 1 CARD TO LAB, STOOL: Fecal Occult Bld: NEGATIVE

## 2019-02-28 MED ORDER — SODIUM BICARBONATE 650 MG PO TABS
650.0000 mg | ORAL_TABLET | Freq: Two times a day (BID) | ORAL | Status: DC
  Administered 2019-02-28 – 2019-03-01 (×4): 650 mg via ORAL
  Filled 2019-02-28 (×4): qty 1

## 2019-02-28 MED ORDER — SODIUM CHLORIDE 0.9 % IV SOLN
INTRAVENOUS | Status: DC
  Administered 2019-02-28 – 2019-03-04 (×7): via INTRAVENOUS

## 2019-02-28 NOTE — Progress Notes (Signed)
[facility]  PROGRESS NOTE  Craig Whitehead:[id number] DOB: 05-07-1928 DOA: 02/25/2019 PCP: Josetta Huddle, MD   Brief HPI:   85 year old male with history of permanent atrial fibrillation, type 2 diabetes mellitus, normal pressure hydrocephalus, BPH, history of bladder cancer presented to ED after falling off his bed with generalized weakness.  This was associated with bowel incontinence and poor oral intake.  Patient lives alone.  Has 3 children who live in the area but does not have relationship with him.  Denies any fever.  Feels depressed but no suicidal or homicidal ideation.  Patient was found to be in acute kidney injury, started on IV fluids.  CT head, cervical spine lumbar spine unremarkable for acute findings.  Also found to be in A. fib with RVR.    Subjective   Patient seen and examined, denies chest pain or shortness of breath.  Renal function is slowly improving.   Assessment/Plan:     1. Acute kidney injury on CKD stage III-patient baseline creatinine 1.78, stage III chronic kidney disease.  Presented with creatinine of 3.45, BUN 88.  Also found to be in high anion gap metabolic acidosis with bicarb 11.  Started on bicarb infusion at 75 mill per hour.  Creatinine has improved to 2.25, BUN 67, CO2 22.  Will change IV fluids to normal saline at 75 mL/h.  2. Anion gap metabolic acidosis-improved, patient presented with anion gap metabolic acidosis, likely from renal failure as above.  Slowly improving after IV hydration with bicarb infusion.  Anion gap has closed at 8.  Will start sodium bicarb tablets 850 mg twice a day.  3. Atrial fibrillation with RVR-patient was given Lopressor 2.5 mg every 4 hours PRN.  Patient is currently not on anticoagulation, patient is not taking any AV nodal blocking agents due to history of bradycardia.  He is on aspirin at home.  We will continue with aspirin 81 mg p.o. daily.  4. Type 2 diabetes mellitus-currently on sliding scale insulin  with NovoLog.  CBG well controlled.  5. QTC prolongation-QTC prolonged to 511.  Currently on cardiac monitoring.  Repeat EKG showed QTC 440.  6. Anemia of chronic disease-patient has baseline hemoglobin of 9-10.  Hemoglobin dropped from 11-7.3 repeat CBC showed hemoglobin of 8.0.  Today hemoglobin is 7.8 likely dilutional.  FOBT is negative.  7. Failure to thrive-PT OT consulted.    CBG: Recent Labs  Lab 02/27/19 0819 02/27/19 1203 02/27/19 1616 02/27/19 2151 02/28/19 0759  GLUCAP 173* 214* 147* 132* 271*    CBC: Recent Labs  Lab 02/25/19 1605 02/27/19 0233 02/27/19 1002 02/28/19 0517  WBC 9.8 4.4 11.8* 13.5*  NEUTROABS 8.2*  --   --   --   HGB 11.0* 7.3* 8.0* 7.8*  HCT 35.9* 21.9* 24.5* 23.7*  MCV 110.5* 104.8* 104.7* 104.9*  PLT 256 136* 133* 131*    Basic Metabolic Panel: Recent Labs  Lab 02/25/19 1605 02/26/19 0215 02/26/19 1332 02/27/19 0233 02/28/19 0517  NA 144 139 138 136 134*  K 5.3* 4.5 4.3 4.3 4.5  CL 115* 111 109 105 104  CO2 11* 18* 19* 21* 22  GLUCOSE 146* 204* 205* 149* 168*  BUN 88* 83* 73* 67* 67*  CREATININE 3.45* 3.03* 2.79* 2.42* 2.25*  CALCIUM 9.2 8.0* 7.6* 7.1* 7.3*  MG 2.1  --   --   --   --      DVT prophylaxis: Heparin  Code Status: Full code  Family Communication: No family at bedside  Disposition Plan: likely home when medically ready for discharge   Scheduled medications:  . aspirin EC  81 mg Oral Daily  . Chlorhexidine Gluconate Cloth  6 each Topical Daily  . feeding supplement (GLUCERNA SHAKE)  237 mL Oral TID BM  . folic acid  1 mg Oral Daily  . heparin injection (subcutaneous)  5,000 Units Subcutaneous Q8H  . insulin aspart  0-5 Units Subcutaneous QHS  . insulin aspart  0-9 Units Subcutaneous TID WC  . mouth rinse  15 mL Mouth Rinse BID  . sodium bicarbonate  650 mg Oral BID  . tamsulosin  0.4 mg Oral Daily  . thiamine  100 mg Oral Daily     Antibiotics:   Anti-infectives (From admission, onward)    None       Objective   Vitals:   02/27/19 1625 02/27/19 2027 02/28/19 0500 02/28/19 0505  BP: 106/85 96/71  94/61  Pulse: 89 94  (!) 107  Resp: (!) 24 16  16   Temp: 98.2 F (36.8 C) 97.9 F (36.6 C)  98.4 F (36.9 C)  TempSrc: Oral Oral  Oral  SpO2: 99% 100%  97%  Weight:   57.8 kg   Height:        Intake/Output Summary (Last 24 hours) at 02/28/2019 1148 Last data filed at 02/28/2019 0850 Gross per 24 hour  Intake 720 ml  Output 1775 ml  Net -1055 ml   Filed Weights   02/26/19 0430 02/27/19 0451 02/28/19 0500  Weight: 55 kg 57.6 kg 57.8 kg     Physical Examination:  General-appears in no acute distress Heart-S1-S2, regular, no murmur auscultated Lungs-clear to auscultation bilaterally, no wheezing or crackles auscultated Abdomen-soft, nontender, no organomegaly Extremities-no edema in the lower extremities Neuro-alert, oriented x3, no focal deficit noted  Data Reviewed: I have personally reviewed following labs and imaging studies   Recent Results (from the past 240 hour(s))  Culture, Urine     Status: Abnormal   Collection Time: 02/26/19  5:04 AM   Specimen: Urine, Clean Catch  Result Value Ref Range Status   Specimen Description   Final    URINE, CLEAN CATCH Performed at [facility], [place] [address], [place], [place] [address]    Special Requests   Final    Normal Performed at [facility], [place] [address] [place], [place] [address]    Culture (A)  Final    <10,000 COLONIES/mL INSIGNIFICANT GROWTH Performed at [place] [address], [place], [place] [address]    Report Status 02/27/2019 FINAL  Final  MRSA PCR Screening     Status: None   Collection Time: 02/26/19  8:45 PM   Specimen: Nasopharyngeal  Result Value Ref Range Status   MRSA by PCR NEGATIVE NEGATIVE Final    Comment:        The GeneXpert MRSA Assay (FDA approved for NASAL specimens only), is one component of a comprehensive MRSA  colonization surveillance program. It is not intended to diagnose MRSA infection nor to guide or monitor treatment for MRSA infections. Performed at [facility], [place] [address] [place], [place] [address]   SARS Coronavirus 2 [facility] order, Performed in [facility] hospital lab)     Status: None   Collection Time: 02/26/19 11:28 PM  Result Value Ref Range Status   SARS Coronavirus 2 NEGATIVE NEGATIVE Final    Comment: (NOTE) If result is NEGATIVE SARS-CoV-2 target nucleic acids are NOT DETECTED. The  SARS-CoV-2 RNA is generally detectable in upper and lower  respiratory specimens during the acute phase of infection. The lowest  concentration of SARS-CoV-2 viral copies this assay can detect is 250  copies / mL. A negative result does not preclude SARS-CoV-2 infection  and should not be used as the sole basis for treatment or other  patient management decisions.  A negative result may occur with  improper specimen collection / handling, submission of specimen other  than nasopharyngeal swab, presence of viral mutation(s) within the  areas targeted by this assay, and inadequate number of viral copies  (<250 copies / mL). A negative result must be combined with clinical  observations, patient history, and epidemiological information. If result is POSITIVE SARS-CoV-2 target nucleic acids are DETECTED. The SARS-CoV-2 RNA is generally detectable in upper and lower  respiratory specimens dur ing the acute phase of infection.  Positive  results are indicative of active infection with SARS-CoV-2.  Clinical  correlation with patient history and other diagnostic information is  necessary to determine patient infection status.  Positive results do  not rule out bacterial infection or co-infection with other viruses. If result is PRESUMPTIVE POSTIVE SARS-CoV-2 nucleic acids MAY BE PRESENT.   A presumptive positive result was obtained on the submitted specimen  and  confirmed on repeat testing.  While 2019 novel coronavirus  (SARS-CoV-2) nucleic acids may be present in the submitted sample  additional confirmatory testing may be necessary for epidemiological  and / or clinical management purposes  to differentiate between  SARS-CoV-2 and other Sarbecovirus currently known to infect humans.  If clinically indicated additional testing with an alternate test  methodology [phone]) is advised. The SARS-CoV-2 RNA is generally  detectable in upper and lower respiratory sp ecimens during the acute  phase of infection. The expected result is Negative. Fact Sheet for Patients:  StrictlyIdeas.no Fact Sheet for Healthcare Providers: BankingDealers.co.za This test is not yet approved or cleared by the [place] FDA and has been authorized for detection and/or diagnosis of SARS-CoV-2 by FDA under an Emergency Use Authorization (EUA).  This EUA will remain in effect (meaning this test can be used) for the duration of the COVID-19 declaration under Section 564(b)(1) of the Act, 21 U.S.C. section 360bbb-3(b)(1), unless the authorization is terminated or revoked sooner. Performed at [facility], [place] [address] [place], [place] [address]      Liver Function Tests: Recent Labs  Lab 02/25/19 1605  AST 17  ALT 22  ALKPHOS 66  BILITOT 0.7  PROT 7.4  ALBUMIN 4.4   No results for input(s): LIPASE, AMYLASE in the last 168 hours. No results for input(s): AMMONIA in the last 168 hours.  Cardiac Enzymes: Recent Labs  Lab 02/25/19 1605  CKTOTAL 183   BNP (last 3 results) Recent Labs    02/25/19 1605  BNP 214.5*       Studies: No results found.   Admission status: Inpatient: Based on patients clinical presentation and evaluation of above clinical data, I have made determination that patient meets Inpatient criteria at this time.  Time spent: 25 min  [facility] Pager [phone]. If 7PM-7AM, please contact night-coverage at [email or web address], Office  [phone]  password [email or web address]  02/28/2019, 11:48 AM  LOS: 3 days

## 2019-02-28 NOTE — Progress Notes (Signed)
Fever noted at 1647 this shift with a temperature of 101 degrees. Tylenol administered. Pt continues to have a temperature of 100.5 (rectal) at 1833. MD notified, orders for urinalysis and blood cultures obtained. Otherwise pt is in no acute distress. Will continue to monitor

## 2019-03-01 LAB — CBC
HCT: 21.9 % — ABNORMAL LOW (ref 39.0–52.0)
Hemoglobin: 7.2 g/dL — ABNORMAL LOW (ref 13.0–17.0)
MCH: 35.5 pg — ABNORMAL HIGH (ref 26.0–34.0)
MCHC: 32.9 g/dL (ref 30.0–36.0)
MCV: 107.9 fL — ABNORMAL HIGH (ref 80.0–100.0)
Platelets: 110 10*3/uL — ABNORMAL LOW (ref 150–400)
RBC: 2.03 MIL/uL — ABNORMAL LOW (ref 4.22–5.81)
RDW: 14.5 % (ref 11.5–15.5)
WBC: 16.6 10*3/uL — ABNORMAL HIGH (ref 4.0–10.5)
nRBC: 0 % (ref 0.0–0.2)

## 2019-03-01 LAB — BLOOD CULTURE ID PANEL (REFLEXED)

## 2019-03-01 LAB — BASIC METABOLIC PANEL
Anion gap: 7 (ref 5–15)
BUN: 62 mg/dL — ABNORMAL HIGH (ref 8–23)
CO2: 21 mmol/L — ABNORMAL LOW (ref 22–32)
Calcium: 7.2 mg/dL — ABNORMAL LOW (ref 8.9–10.3)
Chloride: 106 mmol/L (ref 98–111)
Creatinine, Ser: 2.23 mg/dL — ABNORMAL HIGH (ref 0.61–1.24)
GFR calc Af Amer: 29 mL/min — ABNORMAL LOW (ref >60)
GFR calc non Af Amer: 25 mL/min — ABNORMAL LOW (ref >60)
Glucose, Bld: 166 mg/dL — ABNORMAL HIGH (ref 70–99)
Potassium: 5 mmol/L (ref 3.5–5.1)
Sodium: 134 mmol/L — ABNORMAL LOW (ref 135–145)

## 2019-03-01 LAB — GLUCOSE, CAPILLARY
Glucose-Capillary: 140 mg/dL — ABNORMAL HIGH (ref 70–99)
Glucose-Capillary: 190 mg/dL — ABNORMAL HIGH (ref 70–99)
Glucose-Capillary: 144 mg/dL — ABNORMAL HIGH (ref 70–99)
Glucose-Capillary: 156 mg/dL — ABNORMAL HIGH (ref 70–99)

## 2019-03-01 LAB — LACTIC ACID, PLASMA
Lactic Acid, Venous: 1.6 mmol/L (ref 0.5–1.9)
Lactic Acid, Venous: 1.3 mmol/L (ref 0.5–1.9)

## 2019-03-01 MED ORDER — SODIUM CHLORIDE 0.9 % IV SOLN
1.0000 g | INTRAVENOUS | Status: DC
  Administered 2019-03-01: 11:00:00 1 g via INTRAVENOUS
  Filled 2019-03-01: qty 1

## 2019-03-01 MED ORDER — ENSURE ENLIVE PO LIQD
237.0000 mL | ORAL | Status: DC
  Administered 2019-03-02 – 2019-03-05 (×3): 237 mL via ORAL

## 2019-03-01 NOTE — Progress Notes (Signed)
Pt BP 76/50. MD on call notified and placed verbal order for 500cc normal saline bolus. Pt assisted back to bed and bolus administered. BP after bolus 82/52. MD notified and orders placed to increase continuous fluids from 78ml/h to 115ml/h.  Will continue to monitor.

## 2019-03-01 NOTE — TOC Progression Note (Signed)
Transition of Care [facility]) - Progression Note    Patient Details  Name: Craig Whitehead MRN: [id number] Date of Birth: 11/30/1927  Transition of Care [facility]) CM/SW Contact  Joaquin Courts, RN Phone Number: 03/01/2019, 11:47 AM  Clinical Narrative:   CM spoke with patient at bedside and presented bed offers.  Patient selects [facility]. SNF liason, Juliann Pulse, notified, states male bed will be available towards end of week, possibly by Thursday.  Per MD patient needs to be treated for UTI and will not be ready to dc until later part of week, potentially Wednesday at earliest.  CM confirmed patient does not have [facility] managed [place], SNF to obtain auth. Patient will need a negative covid within three days of dc.        Expected Discharge Plan: [place] Barriers to Discharge: Continued Medical Work up  Expected Discharge Plan and Services Expected Discharge Plan: [place]     Post Acute Care Choice: NA                                         Social Determinants of Health (SDOH) Interventions    Readmission Risk Interventions No flowsheet data found.

## 2019-03-01 NOTE — Progress Notes (Signed)
[facility]  PROGRESS NOTE  Craig Whitehead JJO:[id number] DOB: [birth year] DOA: 02/25/2019 PCP: Josetta Huddle, MD   Brief HPI:   85 year old male with history of permanent atrial fibrillation, type 2 diabetes mellitus, normal pressure hydrocephalus, BPH, history of bladder cancer presented to ED after falling off his bed with generalized weakness.  This was associated with bowel incontinence and poor oral intake.  Patient lives alone.  Has 3 children who live in the area but does not have relationship with him.  Denies any fever.  Feels depressed but no suicidal or homicidal ideation.  Patient was found to be in acute kidney injury, started on IV fluids.  CT head, cervical spine lumbar spine unremarkable for acute findings.  Also found to be in A. fib with RVR.    Subjective   Patient seen and examined, developed fever last night.  UA obtained showed positive nitrate, more than 50 WBCs per high-power field.  He denies dysuria or abdominal pain.  Patient became hypotensive around 1 PM   Assessment/Plan:     1. Acute kidney injury on CKD stage III-patient baseline creatinine 1.78, stage III chronic kidney disease.  Presented with creatinine of 3.45, BUN 88.  Also found to be in high anion gap metabolic acidosis with bicarb 11.  Started on bicarb infusion at 75 mill per hour.  Creatinine has improved to 2.25, BUN 67, CO2 22.  Will change IV fluids to normal saline at 75 mL/h. 2. Sepsis due to UTI-we will start ceftriaxone, urine culture has already been obtained.  Check lactic acid x2.  Patient developed hypotension, will give bolus normal saline 500 mL x 1.  Continue normal saline at 125 mL/h, if he continues to be hypotensive, will  initiate 30 cc/kg fluids bolus.  We will continue to monitor.  3. Anion gap metabolic acidosis-improved, patient presented with anion gap metabolic acidosis, likely from renal failure as above.  Slowly improving after IV hydration with bicarb infusion.  Anion  gap has closed at 8.  Continue sodium bicarb tablets 850 mg twice a day.  4. Atrial fibrillation with RVR-patient was given Lopressor 2.5 mg every 4 hours PRN.  Patient is currently not on anticoagulation, patient is not taking any AV nodal blocking agents due to history of bradycardia.  He is on aspirin at home.  We will continue with aspirin 81 mg p.o. daily.  5. Type 2 diabetes mellitus-currently on sliding scale insulin with NovoLog.  CBG well controlled.  6. QTC prolongation-QTC prolonged to 511.  Currently on cardiac monitoring.  Repeat EKG showed QTC 440.  7. Anemia of chronic disease-patient has baseline hemoglobin of 9-10.  Hemoglobin dropped from 11-7.3 repeat CBC showed hemoglobin of 8.0.  Today hemoglobin is 7.8 likely dilutional.  FOBT is negative.  8. Failure to thrive-PT OT consulted.    CBG: Recent Labs  Lab 02/28/19 1149 02/28/19 1643 02/28/19 2114 03/01/19 0751 03/01/19 1121  GLUCAP 155* 237* 178* 140* 190*    CBC: Recent Labs  Lab 02/25/19 1605 02/27/19 0233 02/27/19 1002 02/28/19 0517 03/01/19 0540  WBC 9.8 4.4 11.8* 13.5* 16.6*  NEUTROABS 8.2*  --   --   --   --   HGB 11.0* 7.3* 8.0* 7.8* 7.2*  HCT 35.9* 21.9* 24.5* 23.7* 21.9*  MCV 110.5* 104.8* 104.7* 104.9* 107.9*  PLT 256 136* 133* 131* 110*    Basic Metabolic Panel: Recent Labs  Lab 02/25/19 1605 02/26/19 0215 02/26/19 1332 02/27/19 0233 02/28/19 0517 03/01/19 0540  NA 144  139 138 136 134* 134*  K 5.3* 4.5 4.3 4.3 4.5 5.0  CL 115* 111 109 105 104 106  CO2 11* 18* 19* 21* 22 21*  GLUCOSE 146* 204* 205* 149* 168* 166*  BUN 88* 83* 73* 67* 67* 62*  CREATININE 3.45* 3.03* 2.79* 2.42* 2.25* 2.23*  CALCIUM 9.2 8.0* 7.6* 7.1* 7.3* 7.2*  MG 2.1  --   --   --   --   --      DVT prophylaxis: Heparin  Code Status: Full code  Family Communication: No family at bedside  Disposition Plan: likely home when medically ready for discharge   Scheduled medications:  . aspirin EC  81 mg  Oral Daily  . [START ON 03/02/2019] feeding supplement (ENSURE ENLIVE)  237 mL Oral Q24H  . feeding supplement (GLUCERNA SHAKE)  237 mL Oral TID BM  . folic acid  1 mg Oral Daily  . heparin injection (subcutaneous)  5,000 Units Subcutaneous Q8H  . insulin aspart  0-5 Units Subcutaneous QHS  . insulin aspart  0-9 Units Subcutaneous TID WC  . mouth rinse  15 mL Mouth Rinse BID  . sodium bicarbonate  650 mg Oral BID  . tamsulosin  0.4 mg Oral Daily  . thiamine  100 mg Oral Daily     Antibiotics:   Anti-infectives (From admission, onward)   Start     Dose/Rate Route Frequency Ordered Stop   03/01/19 1000  cefTRIAXone (ROCEPHIN) 1 g in sodium chloride 0.9 % 100 mL IVPB     1 g 200 mL/hr over 30 Minutes Intravenous Every 24 hours 03/01/19 0925         Objective   Vitals:   03/01/19 0500 03/01/19 0630 03/01/19 1307 03/01/19 1335  BP:  115/77 (!) 80/60 (!) 76/50  Pulse:  87 97   Resp:  18 14   Temp:  98.3 F (36.8 C) 98.1 F (36.7 C)   TempSrc:  Oral Oral   SpO2:  97% 96%   Weight: 54.3 kg     Height:        Intake/Output Summary (Last 24 hours) at 03/01/2019 1350 Last data filed at 03/01/2019 1326 Gross per 24 hour  Intake 3412.13 ml  Output 2300 ml  Net 1112.13 ml   Filed Weights   02/27/19 0451 02/28/19 0500 03/01/19 0500  Weight: 57.6 kg 57.8 kg 54.3 kg     Physical Examination:  General-appears in no acute distress Heart-S1-S2, regular, no murmur auscultated Lungs-clear to auscultation bilaterally, no wheezing or crackles auscultated Abdomen-soft, nontender, no organomegaly Extremities-no edema in the lower extremities Neuro-alert, oriented x3, no focal deficit noted  Data Reviewed: I have personally reviewed following labs and imaging studies   Recent Results (from the past 240 hour(s))  Culture, Urine     Status: Abnormal   Collection Time: 02/26/19  5:04 AM   Specimen: Urine, Clean Catch  Result Value Ref Range Status   Specimen Description   Final     URINE, CLEAN CATCH Performed at [facility], [place] [address] [place], [place] [address]    Special Requests   Final    Normal Performed at [facility], [place] [address] [place], [place] [address]    Culture (A)  Final    <10,000 COLONIES/mL INSIGNIFICANT GROWTH Performed at [place] [address] [place], [place] [address]    Report Status 02/27/2019 FINAL  Final  MRSA PCR Screening     Status:  None   Collection Time: 02/26/19  8:45 PM   Specimen: Nasopharyngeal  Result Value Ref Range Status   MRSA by PCR NEGATIVE NEGATIVE Final    Comment:        The GeneXpert MRSA Assay (FDA approved for NASAL specimens only), is one component of a comprehensive MRSA colonization surveillance program. It is not intended to diagnose MRSA infection nor to guide or monitor treatment for MRSA infections. Performed at [facility], [place] [address] [place], [place] [address]   SARS Coronavirus 2 [facility] order, Performed in [facility] hospital lab)     Status: None   Collection Time: 02/26/19 11:28 PM  Result Value Ref Range Status   SARS Coronavirus 2 NEGATIVE NEGATIVE Final    Comment: (NOTE) If result is NEGATIVE SARS-CoV-2 target nucleic acids are NOT DETECTED. The SARS-CoV-2 RNA is generally detectable in upper and lower  respiratory specimens during the acute phase of infection. The lowest  concentration of SARS-CoV-2 viral copies this assay can detect is 250  copies / mL. A negative result does not preclude SARS-CoV-2 infection  and should not be used as the sole basis for treatment or other  patient management decisions.  A negative result may occur with  improper specimen collection / handling, submission of specimen other  than nasopharyngeal swab, presence of viral mutation(s) within the  areas targeted by this assay, and inadequate number of viral copies  (<250 copies / mL). A negative result must be  combined with clinical  observations, patient history, and epidemiological information. If result is POSITIVE SARS-CoV-2 target nucleic acids are DETECTED. The SARS-CoV-2 RNA is generally detectable in upper and lower  respiratory specimens dur ing the acute phase of infection.  Positive  results are indicative of active infection with SARS-CoV-2.  Clinical  correlation with patient history and other diagnostic information is  necessary to determine patient infection status.  Positive results do  not rule out bacterial infection or co-infection with other viruses. If result is PRESUMPTIVE POSTIVE SARS-CoV-2 nucleic acids MAY BE PRESENT.   A presumptive positive result was obtained on the submitted specimen  and confirmed on repeat testing.  While 2019 novel coronavirus  (SARS-CoV-2) nucleic acids may be present in the submitted sample  additional confirmatory testing may be necessary for epidemiological  and / or clinical management purposes  to differentiate between  SARS-CoV-2 and other Sarbecovirus currently known to infect humans.  If clinically indicated additional testing with an alternate test  methodology [phone]) is advised. The SARS-CoV-2 RNA is generally  detectable in upper and lower respiratory sp ecimens during the acute  phase of infection. The expected result is Negative. Fact Sheet for Patients:  StrictlyIdeas.no Fact Sheet for Healthcare Providers: BankingDealers.co.za This test is not yet approved or cleared by the [place] FDA and has been authorized for detection and/or diagnosis of SARS-CoV-2 by FDA under an Emergency Use Authorization (EUA).  This EUA will remain in effect (meaning this test can be used) for the duration of the COVID-19 declaration under Section 564(b)(1) of the Act, 21 U.S.C. section 360bbb-3(b)(1), unless the authorization is terminated or revoked sooner. Performed at [facility], [place] [address], [place],  [address]   Culture, blood (routine x 2)     Status: None (Preliminary result)   Collection Time: 02/28/19  8:05 PM   Specimen: BLOOD LEFT FOREARM  Result Value Ref Range Status   Specimen Description   Final    BLOOD LEFT  FOREARM Performed at [facility], [place] [address], [place], [place] [address]    Special Requests   Final    BOTTLES DRAWN AEROBIC ONLY Blood Culture adequate volume Performed at [place] [address], [place], [place] [address]    Culture PENDING  Incomplete   Report Status PENDING  Incomplete     Liver Function Tests: Recent Labs  Lab 02/25/19 1605  AST 17  ALT 22  ALKPHOS 66  BILITOT 0.7  PROT 7.4  ALBUMIN 4.4   No results for input(s): LIPASE, AMYLASE in the last 168 hours. No results for input(s): AMMONIA in the last 168 hours.  Cardiac Enzymes: Recent Labs  Lab 02/25/19 1605  CKTOTAL 183   BNP (last 3 results) Recent Labs    02/25/19 1605  BNP 214.5*       Studies: No results found.   Admission status: Inpatient: Based on patients clinical presentation and evaluation of above clinical data, I have made determination that patient meets Inpatient criteria at this time.  Time spent: 25 min  [facility] Pager [phone]. If 7PM-7AM, please contact night-coverage at [email or web address], Office  [phone]  password [email or web address]  03/01/2019, 1:50 PM  LOS: 4 days

## 2019-03-01 NOTE — Progress Notes (Signed)
Initial Nutrition Assessment  INTERVENTION:   -Continue Glucerna Shake po TID, each supplement provides 220 kcal and 10 grams of protein -Provide Ensure Enlive po daily, each supplement provides 350 kcal and 20 grams of protein  NUTRITION DIAGNOSIS:   Increased nutrient needs related to chronic illness as evidenced by estimated needs.  GOAL:   Patient will meet greater than or equal to 90% of their needs  MONITOR:   PO intake, Supplement acceptance, Labs, Weight trends, I & O's  REASON FOR ASSESSMENT:   Malnutrition Screening Tool    ASSESSMENT:   85 year old male with history of permanent atrial fibrillation, type 2 diabetes mellitus, normal pressure hydrocephalus, BPH, history of bladder cancer presented to ED after falling off his bed with generalized weakness.  This was associated with bowel incontinence and poor oral intake. Admitted for AKI.  **RD working remotely**  Patient currently consuming 50-100% of meals. Pt is drinking Glucerna shakes and Ensure consistently. PTA pt was living alone and had a difficult time caring for himself. Pt was subsisting on frozen meals.  Per weight records, pt's weight has been decreasing since 2018. Pt was recorded as weighing 130 lbs on 6/24. This is a 11 lb weight loss (8% wt loss x 1.5 months, significant for time frame).   Medications: Folic acid tablet daily, Thiamine tablet daily Labs reviewed: CBGs: 140-178 Low Na GFR: 29  NUTRITION - FOCUSED PHYSICAL EXAM:  Unable to perform -working remotely.  Diet Order:   Diet Order            Diet heart healthy/carb modified Room service appropriate? Yes; Fluid consistency: Thin  Diet effective now              EDUCATION NEEDS:   No education needs have been identified at this time  Skin:  Skin Assessment: Reviewed RN Assessment  Last BM:  8/10  Height:   Ht Readings from Last 1 Encounters:  02/25/19 5\' 7"  (1.702 m)    Weight:   Wt Readings from Last 1  Encounters:  03/01/19 54.3 kg    Ideal Body Weight:  67.2 kg  BMI:  Body mass index is 18.75 kg/m.  Estimated Nutritional Needs:   Kcal:  1700-1900  Protein:  80-90g  Fluid:  1.9L/day   Clayton Bibles, MS, RD, LDN [facility] Pager: [phone] After Hours Pager: [phone]

## 2019-03-01 NOTE — Care Management Important Message (Signed)
Important Message  Patient Details  Name: DIONTAE ROUTE MRN: [id number] Date of Birth: 1928-02-07   Medicare Important Message Given:  Yes. CMA printed out the IM for the Case Manager Nurse or the CSW to give to the patient.      Aldrin Engelhard 03/01/2019, 11:24 AM

## 2019-03-01 NOTE — Progress Notes (Signed)
Physical Therapy Treatment Patient Details Name: Craig Whitehead MRN: [id number] DOB: 1928-02-15 Today's Date: 03/01/2019    History of Present Illness 85 year old man who was admitted after a fall from bed and found to have acute kidney infection.  PMH:  permanent AFIb, DM, normal pressure hydrocephalus, bladder CA, CHF.  He reports that legs give out    PT Comments    Assisted OOB to amb to bathroom.  General bed mobility comments: Increased time with physical assist to roll, bring trunk to upright and complete transition to EOB with use of pad.  Assisted with sit to stand from bed and toilet transferGeneral transfer comment: assist to rise and steady.  Very weak.  Very deconditioned.  Also assisted with toilet transfer. Extended time in bathroom to complete BM.  Assisted with peri care as pt was too unsteady and weak to self perform.  Assisted with amb a short distance to recliner.  General Gait Details: cues for posture and position from RW; Pt unstable on feet even with RW and with knees buckling with increased fatigue.  Poor forward flex posture.  Poor balance.  Limited distance.  HIGH FALL RISK.  Positioned in recliner with multiple pillows for pressure relief. Pt will need ST Rehab at  SNF prior to safely returning home alone.   Follow Up Recommendations  SNF     Equipment Recommendations  None recommended by PT    Recommendations for Other Services       Precautions / Restrictions Precautions Precautions: Fall Restrictions Weight Bearing Restrictions: No    Mobility  Bed Mobility Overal bed mobility: Needs Assistance Bed Mobility: Supine to Sit     Supine to sit: Min assist;Mod assist     General bed mobility comments: Increased time with physical assist to roll, bring trunk to upright and complete transition to EOB with use of pad  Transfers Overall transfer level: Needs assistance Equipment used: Rolling walker (2 wheeled) Transfers: Sit to/from Stand Sit to  Stand: Min assist;+2 safety/equipment Stand pivot transfers: Min assist;Mod assist;+2 physical assistance;+2 safety/equipment       General transfer comment: assist to rise and steady.  Very weak.  very deconditioned.  Also assisted with toilet transfer.  Ambulation/Gait Ambulation/Gait assistance: Min assist;Mod assist;+2 safety/equipment Gait Distance (Feet): 12 Feet(to and from bathroom) Assistive device: Rolling walker (2 wheeled) Gait Pattern/deviations: Shuffle;Decreased step length - right;Decreased step length - left;Step-to pattern;Trunk flexed Gait velocity: decr   General Gait Details: cues for posture and position from RW; Pt unstable on feet even with RW and with knees buckling with increased fatigue.  Poor forward flex posture.  Poor balance.  Limited distance.  HIGH FALL RISK.   Stairs             Wheelchair Mobility    Modified Rankin (Stroke Patients Only)       Balance                                            Cognition Arousal/Alertness: Awake/alert Behavior During Therapy: WFL for tasks assessed/performed Overall Cognitive Status: Within Functional Limits for tasks assessed                                 General Comments: slightly delayed in responce but appears AxO x 4  Exercises      General Comments        Pertinent Vitals/Pain Pain Assessment: Faces Faces Pain Scale: Hurts a little bit Pain Location: general    Home Living                      Prior Function            PT Goals (current goals can now be found in the care plan section) Progress towards PT goals: Progressing toward goals    Frequency    Min 2X/week      PT Plan Current plan remains appropriate    Co-evaluation              AM-PAC PT "6 Clicks" Mobility   Outcome Measure  Help needed turning from your back to your side while in a flat bed without using bedrails?: A Lot Help needed moving from  lying on your back to sitting on the side of a flat bed without using bedrails?: A Lot Help needed moving to and from a bed to a chair (including a wheelchair)?: A Lot Help needed standing up from a chair using your arms (e.g., wheelchair or bedside chair)?: A Lot Help needed to walk in hospital room?: A Lot Help needed climbing 3-5 steps with a railing? : Total 6 Click Score: 11    End of Session Equipment Utilized During Treatment: Gait belt Activity Tolerance: Patient limited by fatigue Patient left: in chair;with call bell/phone within reach;with chair alarm set Nurse Communication: Mobility status PT Visit Diagnosis: Unsteadiness on feet (R26.81);Muscle weakness (generalized) (M62.81);Difficulty in walking, not elsewhere classified (R26.2);History of falling (Z91.81)     Time: 5537-4827 PT Time Calculation (min) (ACUTE ONLY): 39 min  Charges:  $Gait Training: 8-22 mins $Therapeutic Activity: 23-37 mins                     Rica Koyanagi  PTA Acute  Rehabilitation Services Pager      [phone] Office      [phone]

## 2019-03-02 DIAGNOSIS — A419 Sepsis, unspecified organism: Secondary | ICD-10-CM

## 2019-03-02 DIAGNOSIS — N390 Urinary tract infection, site not specified: Secondary | ICD-10-CM

## 2019-03-02 LAB — CBC
HCT: 22.1 % — ABNORMAL LOW (ref 39.0–52.0)
Hemoglobin: 7 g/dL — ABNORMAL LOW (ref 13.0–17.0)
MCH: 34.7 pg — ABNORMAL HIGH (ref 26.0–34.0)
MCHC: 31.7 g/dL (ref 30.0–36.0)
MCV: 109.4 fL — ABNORMAL HIGH (ref 80.0–100.0)
Platelets: 124 10*3/uL — ABNORMAL LOW (ref 150–400)
RBC: 2.02 MIL/uL — ABNORMAL LOW (ref 4.22–5.81)
RDW: 14.6 % (ref 11.5–15.5)
WBC: 7.7 10*3/uL (ref 4.0–10.5)
nRBC: 0 % (ref 0.0–0.2)

## 2019-03-02 LAB — GLUCOSE, CAPILLARY
Glucose-Capillary: 143 mg/dL — ABNORMAL HIGH (ref 70–99)
Glucose-Capillary: 271 mg/dL — ABNORMAL HIGH (ref 70–99)
Glucose-Capillary: 107 mg/dL — ABNORMAL HIGH (ref 70–99)
Glucose-Capillary: 153 mg/dL — ABNORMAL HIGH (ref 70–99)

## 2019-03-02 LAB — BASIC METABOLIC PANEL
Anion gap: 8 (ref 5–15)
BUN: 64 mg/dL — ABNORMAL HIGH (ref 8–23)
CO2: 21 mmol/L — ABNORMAL LOW (ref 22–32)
Calcium: 7.1 mg/dL — ABNORMAL LOW (ref 8.9–10.3)
Chloride: 104 mmol/L (ref 98–111)
Creatinine, Ser: 2.13 mg/dL — ABNORMAL HIGH (ref 0.61–1.24)
GFR calc Af Amer: 31 mL/min — ABNORMAL LOW (ref >60)
GFR calc non Af Amer: 27 mL/min — ABNORMAL LOW (ref >60)
Glucose, Bld: 139 mg/dL — ABNORMAL HIGH (ref 70–99)
Potassium: 5.3 mmol/L — ABNORMAL HIGH (ref 3.5–5.1)
Sodium: 133 mmol/L — ABNORMAL LOW (ref 135–145)

## 2019-03-02 MED ORDER — SODIUM POLYSTYRENE SULFONATE 15 GM/60ML PO SUSP
15.0000 g | Freq: Once | ORAL | Status: AC
  Administered 2019-03-02: 15 g via ORAL
  Filled 2019-03-02: qty 60

## 2019-03-02 MED ORDER — SODIUM BICARBONATE 650 MG PO TABS
650.0000 mg | ORAL_TABLET | Freq: Every day | ORAL | Status: DC
  Administered 2019-03-02 – 2019-03-06 (×5): 650 mg via ORAL
  Filled 2019-03-02 (×5): qty 1

## 2019-03-02 MED ORDER — SODIUM CHLORIDE 0.9 % IV SOLN
2.0000 g | INTRAVENOUS | Status: DC
  Administered 2019-03-02 – 2019-03-04 (×3): 2 g via INTRAVENOUS
  Filled 2019-03-02 (×3): qty 2

## 2019-03-02 NOTE — Progress Notes (Signed)
[facility]  PROGRESS NOTE  Craig Whitehead VPX:[id number] DOB: Dec 23, 1927 DOA: 02/25/2019 PCP: Josetta Huddle, MD   Brief HPI:   85 year old male with history of permanent atrial fibrillation, type 2 diabetes mellitus, normal pressure hydrocephalus, BPH, history of bladder cancer presented to ED after falling off his bed with generalized weakness.  This was associated with bowel incontinence and poor oral intake.  Patient lives alone.  Has 3 children who live in the area but does not have relationship with him.  Denies any fever.  Feels depressed but no suicidal or homicidal ideation.  Patient was found to be in acute kidney injury, started on IV fluids.  CT head, cervical spine lumbar spine unremarkable for acute findings.  Also found to be in A. fib with RVR.    Subjective   Patient seen and examined, feels better this morning.  Yesterday he became hypotensive requiring IV fluid bolus 500 cc x 1.  He was started on normal saline at 125 mL/h.  Blood pressure improved.  Lactic acid was 1.3.  UA was abnormal, started on IV ceftriaxone.  Urine culture is growing greater than 100,000 colonies of gram-negative rods.   Assessment/Plan:     1. Acute kidney injury on CKD stage III-patient baseline creatinine 1.78, stage III chronic kidney disease.  Presented with creatinine of 3.45, BUN 88.  Also found to be in high anion gap metabolic acidosis with bicarb 11.  Started on bicarb infusion at 75 mill per hour.  Creatinine has improved to 2.13, BUN 67, CO2 22.  Will change IV fluids to normal saline at 75 mL/h.  2. Sepsis due to UTI-started on ceftriaxone.  Lactic acid 1.3.  Required 500 cc normal saline bolus, IV fluids were started at normal saline at 125 mL/h.  At this time blood pressure has improved.  WBC is down to 7.7.  Continue antibiotics, IV fluids.  Follow culture results.  Urine culture is growing greater than 100,000 colonies of gram-negative rods.   3. Anion gap metabolic  acidosis-improved, patient presented with anion gap metabolic acidosis, likely from renal failure as above. Slowly improving after IV hydration with bicarb infusion.  Anion gap has closed at 8.   Patient can be discharged on sodium bicarb tablet 850 mg p.o. daily.  4. Atrial fibrillation with RVR-patient was given Lopressor 2.5 mg every 4 hours PRN.  Patient is currently not on anticoagulation, patient is not taking any AV nodal blocking agents due to history of bradycardia.  He is on aspirin at home.  We will continue with aspirin 81 mg p.o. daily.  5. Type 2 diabetes mellitus-currently on sliding scale insulin with NovoLog.  CBG well controlled.  6. QTC prolongation-QTC prolonged to 511.  Currently on cardiac monitoring.  Repeat EKG showed QTC 440.  7. Mild hyperkalemia-potassium 5.3, will give 1 dose of Kayexalate 15 g p.o. x1.  Follow BMP in a.m.  8. Anemia of chronic disease-patient has baseline hemoglobin of 9-10.  Hemoglobin dropped from 11-7.3 repeat CBC showed hemoglobin of 8.0.  Today hemoglobin is 7.0 likely dilutional.  FOBT is negative.  Follow CBC in a.m.  9. Failure to thrive-PT OT consulted.    CBG: Recent Labs  Lab 03/01/19 1121 03/01/19 1604 03/01/19 2059 03/02/19 0732 03/02/19 1130  GLUCAP 190* 144* 156* 143* 271*    CBC: Recent Labs  Lab 02/25/19 1605 02/27/19 0233 02/27/19 1002 02/28/19 0517 03/01/19 0540 03/02/19 0417  WBC 9.8 4.4 11.8* 13.5* 16.6* 7.7  NEUTROABS 8.2*  --   --   --   --   --  HGB 11.0* 7.3* 8.0* 7.8* 7.2* 7.0*  HCT 35.9* 21.9* 24.5* 23.7* 21.9* 22.1*  MCV 110.5* 104.8* 104.7* 104.9* 107.9* 109.4*  PLT 256 136* 133* 131* 110* 124*    Basic Metabolic Panel: Recent Labs  Lab 02/25/19 1605  02/26/19 1332 02/27/19 0233 02/28/19 0517 03/01/19 0540 03/02/19 0417  NA 144   < > 138 136 134* 134* 133*  K 5.3*   < > 4.3 4.3 4.5 5.0 5.3*  CL 115*   < > 109 105 104 106 104  CO2 11*   < > 19* 21* 22 21* 21*  GLUCOSE 146*   < > 205*  149* 168* 166* 139*  BUN 88*   < > 73* 67* 67* 62* 64*  CREATININE 3.45*   < > 2.79* 2.42* 2.25* 2.23* 2.13*  CALCIUM 9.2   < > 7.6* 7.1* 7.3* 7.2* 7.1*  MG 2.1  --   --   --   --   --   --    < > = values in this interval not displayed.     DVT prophylaxis: Heparin  Code Status: Full code  Family Communication: No family at bedside  Disposition Plan: likely skilled nursing facility in 1 to 2 days   Scheduled medications:  . aspirin EC  81 mg Oral Daily  . feeding supplement (ENSURE ENLIVE)  237 mL Oral Q24H  . feeding supplement (GLUCERNA SHAKE)  237 mL Oral TID BM  . folic acid  1 mg Oral Daily  . heparin injection (subcutaneous)  5,000 Units Subcutaneous Q8H  . insulin aspart  0-5 Units Subcutaneous QHS  . insulin aspart  0-9 Units Subcutaneous TID WC  . mouth rinse  15 mL Mouth Rinse BID  . sodium bicarbonate  650 mg Oral Daily  . tamsulosin  0.4 mg Oral Daily  . thiamine  100 mg Oral Daily     Antibiotics:   Anti-infectives (From admission, onward)   Start     Dose/Rate Route Frequency Ordered Stop   03/02/19 0100  cefTRIAXone (ROCEPHIN) 2 g in sodium chloride 0.9 % 100 mL IVPB     2 g 200 mL/hr over 30 Minutes Intravenous Every 24 hours 03/02/19 0006     03/01/19 1000  cefTRIAXone (ROCEPHIN) 1 g in sodium chloride 0.9 % 100 mL IVPB  Status:  Discontinued     1 g 200 mL/hr over 30 Minutes Intravenous Every 24 hours 03/01/19 0925 03/02/19 0006       Objective   Vitals:   03/01/19 2057 03/02/19 0404 03/02/19 0526 03/02/19 1311  BP: 127/80 (!) 152/75  (!) 116/54  Pulse: 77 80  (!) 43  Resp: 18 18  14   Temp: 98.6 F (37 C) 98.8 F (37.1 C)  98.1 F (36.7 C)  TempSrc: Oral Oral  Oral  SpO2: 98% 98%  99%  Weight:   57.7 kg   Height:        Intake/Output Summary (Last 24 hours) at 03/02/2019 1424 Last data filed at 03/02/2019 1343 Gross per 24 hour  Intake 2977.8 ml  Output 1975 ml  Net 1002.8 ml   Filed Weights   02/28/19 0500 03/01/19 0500  03/02/19 0526  Weight: 57.8 kg 54.3 kg 57.7 kg     Physical Examination:  General-appears in no acute distress Heart-S1-S2, regular, no murmur auscultated Lungs-clear to auscultation bilaterally, no wheezing or crackles auscultated Abdomen-soft, nontender, no organomegaly Extremities-no edema in the lower extremities Neuro-alert, oriented x3, no focal deficit noted  Data Reviewed: I have personally reviewed following labs and imaging studies   Recent Results (from the past 240 hour(s))  Culture, Urine     Status: Abnormal   Collection Time: 02/26/19  5:04 AM   Specimen: Urine, Clean Catch  Result Value Ref Range Status   Specimen Description   Final    URINE, CLEAN CATCH Performed at [place] [address], [place], [place] [address]    Special Requests   Final    Normal Performed at [facility], [place] [address] [place], [place] [address]    Culture (A)  Final    <10,000 COLONIES/mL INSIGNIFICANT GROWTH Performed at [place] [address], [place], [place] [address]    Report Status 02/27/2019 FINAL  Final  MRSA PCR Screening     Status: None   Collection Time: 02/26/19  8:45 PM   Specimen: Nasopharyngeal  Result Value Ref Range Status   MRSA by PCR NEGATIVE NEGATIVE Final    Comment:        The GeneXpert MRSA Assay (FDA approved for NASAL specimens only), is one component of a comprehensive MRSA colonization surveillance program. It is not intended to diagnose MRSA infection nor to guide or monitor treatment for MRSA infections. Performed at [facility], [place] [address], [place], [place] [address]   SARS Coronavirus 2 [facility] order, Performed in [facility] hospital lab)     Status: None   Collection Time: 02/26/19 11:28 PM  Result Value Ref Range Status   SARS Coronavirus 2 NEGATIVE NEGATIVE Final    Comment: (NOTE) If result is NEGATIVE SARS-CoV-2 target nucleic acids are NOT  DETECTED. The SARS-CoV-2 RNA is generally detectable in upper and lower  respiratory specimens during the acute phase of infection. The lowest  concentration of SARS-CoV-2 viral copies this assay can detect is 250  copies / mL. A negative result does not preclude SARS-CoV-2 infection  and should not be used as the sole basis for treatment or other  patient management decisions.  A negative result may occur with  improper specimen collection / handling, submission of specimen other  than nasopharyngeal swab, presence of viral mutation(s) within the  areas targeted by this assay, and inadequate number of viral copies  (<250 copies / mL). A negative result must be combined with clinical  observations, patient history, and epidemiological information. If result is POSITIVE SARS-CoV-2 target nucleic acids are DETECTED. The SARS-CoV-2 RNA is generally detectable in upper and lower  respiratory specimens dur ing the acute phase of infection.  Positive  results are indicative of active infection with SARS-CoV-2.  Clinical  correlation with patient history and other diagnostic information is  necessary to determine patient infection status.  Positive results do  not rule out bacterial infection or co-infection with other viruses. If result is PRESUMPTIVE POSTIVE SARS-CoV-2 nucleic acids MAY BE PRESENT.   A presumptive positive result was obtained on the submitted specimen  and confirmed on repeat testing.  While 2019 novel coronavirus  (SARS-CoV-2) nucleic acids may be present in the submitted sample  additional confirmatory testing may be necessary for epidemiological  and / or clinical management purposes  to differentiate between  SARS-CoV-2 and other Sarbecovirus currently known to infect humans.  If clinically indicated additional testing with an alternate test  methodology [phone]) is advised. The SARS-CoV-2 RNA is generally  detectable in upper and lower respiratory sp ecimens during  the acute  phase of infection. The  expected result is Negative. Fact Sheet for Patients:  StrictlyIdeas.no Fact Sheet for Healthcare Providers: BankingDealers.co.za This test is not yet approved or cleared by the [place] FDA and has been authorized for detection and/or diagnosis of SARS-CoV-2 by FDA under an Emergency Use Authorization (EUA).  This EUA will remain in effect (meaning this test can be used) for the duration of the COVID-19 declaration under Section 564(b)(1) of the Act, 21 U.S.C. section 360bbb-3(b)(1), unless the authorization is terminated or revoked sooner. Performed at [facility], [place] [address] [place], [place] [address]   Culture, blood (routine x 2)     Status: None (Preliminary result)   Collection Time: 02/28/19  8:05 PM   Specimen: BLOOD LEFT FOREARM  Result Value Ref Range Status   Specimen Description   Final    BLOOD LEFT FOREARM Performed at [facility], [place] [address], [place], [place] [address]    Special Requests   Final    BOTTLES DRAWN AEROBIC ONLY Blood Culture adequate volume Performed at [place] [address], [place], [place] [address]    Culture  Setup Time   Final    AEROBIC BOTTLE ONLY GRAM POSITIVE COCCI CRITICAL RESULT CALLED TO, READ BACK BY AND VERIFIED WITH: Shelda Jakes [facility] 03/01/19 2352 JDW Performed at [facility], [place] [employer] [address] [place], [place] [address]    Culture GRAM POSITIVE COCCI  Final   Report Status PENDING  Incomplete  Culture, blood (routine x 2)     Status: None (Preliminary result)   Collection Time: 02/28/19  8:05 PM   Specimen: BLOOD  Result Value Ref Range Status   Specimen Description   Final    BLOOD LEFT ANTECUBITAL Performed at [place] [address] [place], [place] [address]    Special Requests   Final    BOTTLES DRAWN AEROBIC ONLY Blood Culture adequate  volume Performed at [place] [address], [place], [place] [address]    Culture   Final    NO GROWTH 1 DAY Performed at [facility], [place] [address] [place],  [address]    Report Status PENDING  Incomplete  Blood Culture ID Panel (Reflexed)     Status: Abnormal   Collection Time: 02/28/19  8:05 PM  Result Value Ref Range Status   Enterococcus species NOT DETECTED NOT DETECTED Final   Listeria monocytogenes NOT DETECTED NOT DETECTED Final   Staphylococcus species DETECTED (A) NOT DETECTED Final    Comment: Methicillin (oxacillin) susceptible coagulase negative staphylococcus. Possible blood culture contaminant (unless isolated from more than one blood culture draw or clinical case suggests pathogenicity). No antibiotic treatment is indicated for blood  culture contaminants. CRITICAL RESULT CALLED TO, READ BACK BY AND VERIFIED WITH: Shelda Jakes [facility] 03/01/19 2352 JDW    Staphylococcus aureus (BCID) NOT DETECTED NOT DETECTED Final   Methicillin resistance NOT DETECTED NOT DETECTED Final   Streptococcus species NOT DETECTED NOT DETECTED Final   Streptococcus agalactiae NOT DETECTED NOT DETECTED Final   Streptococcus pneumoniae NOT DETECTED NOT DETECTED Final   Streptococcus pyogenes NOT DETECTED NOT DETECTED Final   Acinetobacter baumannii NOT DETECTED NOT DETECTED Final   Enterobacteriaceae species NOT DETECTED NOT DETECTED Final   Enterobacter cloacae complex NOT DETECTED NOT DETECTED Final   Escherichia coli NOT DETECTED NOT DETECTED Final   Klebsiella oxytoca NOT DETECTED NOT DETECTED Final   Klebsiella pneumoniae NOT DETECTED NOT DETECTED Final  Proteus species NOT DETECTED NOT DETECTED Final   Serratia marcescens NOT DETECTED NOT DETECTED Final   Haemophilus influenzae NOT DETECTED NOT DETECTED Final   Neisseria meningitidis NOT DETECTED NOT DETECTED Final   Pseudomonas aeruginosa NOT DETECTED NOT DETECTED Final   Candida albicans NOT  DETECTED NOT DETECTED Final   Candida glabrata NOT DETECTED NOT DETECTED Final   Candida krusei NOT DETECTED NOT DETECTED Final   Candida parapsilosis NOT DETECTED NOT DETECTED Final   Candida tropicalis NOT DETECTED NOT DETECTED Final    Comment: Performed at [facility], [place] [address], [place], [place] [address]  Culture, Urine     Status: Abnormal (Preliminary result)   Collection Time: 03/01/19  9:01 AM   Specimen: Urine, Clean Catch  Result Value Ref Range Status   Specimen Description   Final    URINE, CLEAN CATCH Performed at [facility], [place] [address] [place], [place] [address]    Special Requests   Final    NONE Performed at [facility], [place] [address], [place], [place] [address]    Culture (A)  Final    >=100,000 COLONIES/mL GRAM NEGATIVE RODS CULTURE REINCUBATED FOR BETTER GROWTH Performed at [facility], [place] [address], [place], [place] [address]    Report Status PENDING  Incomplete     Liver Function Tests: Recent Labs  Lab 02/25/19 1605  AST 17  ALT 22  ALKPHOS 66  BILITOT 0.7  PROT 7.4  ALBUMIN 4.4   No results for input(s): LIPASE, AMYLASE in the last 168 hours. No results for input(s): AMMONIA in the last 168 hours.  Cardiac Enzymes: Recent Labs  Lab 02/25/19 1605  CKTOTAL 183   BNP (last 3 results) Recent Labs    02/25/19 1605  BNP 214.5*     Admission status: Inpatient: Based on patients clinical presentation and evaluation of above clinical data, I have made determination that patient meets Inpatient criteria at this time.  Time spent: 25 min  [facility] Pager [phone]. If 7PM-7AM, please contact night-coverage at [email or web address], Office  [phone]  password [email or web address]  03/02/2019, 2:24 PM  LOS: 5 days

## 2019-03-02 NOTE — TOC Progression Note (Signed)
Transition of Care [facility]) - Progression Note    Patient Details  Name: Craig Whitehead MRN: [id number] Date of Birth: [birth year]  Transition of Care [facility]) CM/SW Contact  Joaquin Courts, RN Phone Number: 03/02/2019, 11:38 AM  Clinical Narrative:    CM received call from [place] healthcare liaison notifying that [facility] will not have a bed available for patient until sometime next week.  CM spoke with patient abut this, patient now selects [facility]. Liaison notified. Facility to begin [place].     Expected Discharge Plan: [place] Barriers to Discharge: Continued Medical Work up  Expected Discharge Plan and Services Expected Discharge Plan: [place]     Post Acute Care Choice: NA                                         Social Determinants of Health (SDOH) Interventions    Readmission Risk Interventions No flowsheet data found.

## 2019-03-02 NOTE — Progress Notes (Signed)
PHARMACY - PHYSICIAN COMMUNICATION CRITICAL VALUE ALERT - BLOOD CULTURE IDENTIFICATION (BCID)  Craig Whitehead is an 85 y.o. male who presented to [facility] on [facility]/25/2019 with a chief complaint of a fall.  Assessment:  Currently on ceftriaxone 1 g IV daily for suspected UTI.   Name of physician (or Provider) Contacted: Tylene Fantasia, NP  Current antibiotics: Ceftriaxone 1 g IV q24h  Changes to prescribed antibiotics: Increased dose to ceftriaxone 2 g IV q24h   Results for orders placed or performed during the hospital encounter of 02/25/19  Blood Culture ID Panel (Reflexed) (Collected: 02/28/2019  8:05 PM)  Result Value Ref Range   Enterococcus species NOT DETECTED NOT DETECTED   Listeria monocytogenes NOT DETECTED NOT DETECTED   Staphylococcus species DETECTED (A) NOT DETECTED   Staphylococcus aureus (BCID) NOT DETECTED NOT DETECTED   Methicillin resistance NOT DETECTED NOT DETECTED   Streptococcus species NOT DETECTED NOT DETECTED   Streptococcus agalactiae NOT DETECTED NOT DETECTED   Streptococcus pneumoniae NOT DETECTED NOT DETECTED   Streptococcus pyogenes NOT DETECTED NOT DETECTED   Acinetobacter baumannii NOT DETECTED NOT DETECTED   Enterobacteriaceae species NOT DETECTED NOT DETECTED   Enterobacter cloacae complex NOT DETECTED NOT DETECTED   Escherichia coli NOT DETECTED NOT DETECTED   Klebsiella oxytoca NOT DETECTED NOT DETECTED   Klebsiella pneumoniae NOT DETECTED NOT DETECTED   Proteus species NOT DETECTED NOT DETECTED   Serratia marcescens NOT DETECTED NOT DETECTED   Haemophilus influenzae NOT DETECTED NOT DETECTED   Neisseria meningitidis NOT DETECTED NOT DETECTED   Pseudomonas aeruginosa NOT DETECTED NOT DETECTED   Candida albicans NOT DETECTED NOT DETECTED   Candida glabrata NOT DETECTED NOT DETECTED   Candida krusei NOT DETECTED NOT DETECTED   Candida parapsilosis NOT DETECTED NOT DETECTED   Candida tropicalis NOT DETECTED NOT [place],  PharmD 03/02/2019  12:14 AM

## 2019-03-02 NOTE — Progress Notes (Signed)
CSW received a call from Adult Protective Service Case Worker- [employer]. Patient has an open case. CSW provided brief updates. The Case worker will reach out to the patient and medical records for additional information.

## 2019-03-02 NOTE — TOC Progression Note (Signed)
Transition of Care [facility]) - Progression Note    Patient Details  Name: Craig Whitehead MRN: [id number] Date of Birth: 1927/10/22  Transition of Care [facility]) CM/SW Contact  Joaquin Courts, RN Phone Number: 03/02/2019, 2:51 PM  Clinical Narrative:    CM received call from [facility] liaison stating she has attempted to initiate auth with [facility], however their system is down and she is unable to get through via phone or online.  [facility] will continue to attempt to initiate auth.     Expected Discharge Plan: [place] Barriers to Discharge: Continued Medical Work up  Expected Discharge Plan and Services Expected Discharge Plan: [place]     Post Acute Care Choice: NA                                         Social Determinants of Health (SDOH) Interventions    Readmission Risk Interventions No flowsheet data found.

## 2019-03-03 LAB — CULTURE, BLOOD (ROUTINE X 2): Special Requests: ADEQUATE

## 2019-03-03 LAB — COMPREHENSIVE METABOLIC PANEL
ALT: 30 U/L (ref 0–44)
AST: 21 U/L (ref 15–41)
Albumin: 2.6 g/dL — ABNORMAL LOW (ref 3.5–5.0)
Alkaline Phosphatase: 56 U/L (ref 38–126)
Anion gap: 9 (ref 5–15)
BUN: 59 mg/dL — ABNORMAL HIGH (ref 8–23)
CO2: 20 mmol/L — ABNORMAL LOW (ref 22–32)
Calcium: 7.3 mg/dL — ABNORMAL LOW (ref 8.9–10.3)
Chloride: 108 mmol/L (ref 98–111)
Creatinine, Ser: 2.02 mg/dL — ABNORMAL HIGH (ref 0.61–1.24)
GFR calc Af Amer: 33 mL/min — ABNORMAL LOW (ref >60)
GFR calc non Af Amer: 28 mL/min — ABNORMAL LOW (ref >60)
Glucose, Bld: 167 mg/dL — ABNORMAL HIGH (ref 70–99)
Potassium: 4.9 mmol/L (ref 3.5–5.1)
Sodium: 137 mmol/L (ref 135–145)
Total Bilirubin: 0.3 mg/dL (ref 0.3–1.2)
Total Protein: 5.2 g/dL — ABNORMAL LOW (ref 6.5–8.1)

## 2019-03-03 LAB — CBC
HCT: 23.2 % — ABNORMAL LOW (ref 39.0–52.0)
Hemoglobin: 7.2 g/dL — ABNORMAL LOW (ref 13.0–17.0)
MCH: 34.6 pg — ABNORMAL HIGH (ref 26.0–34.0)
MCHC: 31 g/dL (ref 30.0–36.0)
MCV: 111.5 fL — ABNORMAL HIGH (ref 80.0–100.0)
Platelets: 148 10*3/uL — ABNORMAL LOW (ref 150–400)
RBC: 2.08 MIL/uL — ABNORMAL LOW (ref 4.22–5.81)
RDW: 14.6 % (ref 11.5–15.5)
WBC: 5.1 10*3/uL (ref 4.0–10.5)
nRBC: 0 % (ref 0.0–0.2)

## 2019-03-03 LAB — GLUCOSE, CAPILLARY
Glucose-Capillary: 137 mg/dL — ABNORMAL HIGH (ref 70–99)
Glucose-Capillary: 161 mg/dL — ABNORMAL HIGH (ref 70–99)
Glucose-Capillary: 129 mg/dL — ABNORMAL HIGH (ref 70–99)
Glucose-Capillary: 136 mg/dL — ABNORMAL HIGH (ref 70–99)

## 2019-03-03 NOTE — Progress Notes (Addendum)
Occupational Therapy Treatment Patient Details Name: Craig Whitehead MRN: [id number] DOB: September 23, 1927 Today's Date: 03/03/2019    History of present illness 85 year old man who was admitted after a fall from bed and found to have acute kidney infection.  PMH:  permanent AFIb, DM, normal pressure hydrocephalus, bladder CA, CHF.  He reports that legs give out   OT comments  Pt tolerated session well with some limitations with dizziness, BP checked (see values below). Pt engaged in EOB ADLs tasks with Min A to Mod A for bed mobility and set up for grooming/ UE bathing. Pt will benefit from continued acute OT to improve strength for self care tasks and safety in functional mobility. DC and freq remains the same OT will continue to follow acutely.    Follow Up Recommendations  SNF    Equipment Recommendations  3 in 1 bedside commode    Recommendations for Other Services      Precautions / Restrictions Precautions Precautions: Fall Restrictions Weight Bearing Restrictions: No       Mobility Bed Mobility Overal bed mobility: Needs Assistance Bed Mobility: Supine to Sit     Supine to sit: Min assist;Mod assist     General bed mobility comments: Increased time with physical assist to roll, VCs and hand over hand assist to bring R arm to bed rail.  OT assist to manage LE to EOB and use of bed pad. Min A of physical assist to elevate trunk to sit up right.   Transfers Overall transfer level: Needs assistance               General transfer comment: session involved EOB self care activities.     Balance Overall balance assessment: Needs assistance Sitting-balance support: No upper extremity supported;Feet supported Sitting balance-Leahy Scale: Fair Sitting balance - Comments: tended to lose balance posterior   Standing balance support: Bilateral upper extremity supported Standing balance-Leahy Scale: Poor                             ADL either performed or  assessed with clinical judgement   ADL Overall ADL's : Needs assistance/impaired     Grooming: Set up   Upper Body Bathing: Set up           Lower Body Dressing: Total assistance;+2 for safety/equipment   Toilet Transfer: (and chair)             General ADL Comments: grooming and dressing performed while seated at EOB. No signitifcant drop in BP (values: supine 149/98, seated at EOB: 146/88 and 144/91) Pt did complain of dizziness but was able perform grooming and UB bathing.      Vision       Perception     Praxis      Cognition Arousal/Alertness: Awake/alert Behavior During Therapy: WFL for tasks assessed/performed Overall Cognitive Status: Within Functional Limits for tasks assessed                                 General Comments: slightly delayed in responce but appears AxO x 4        Exercises     Shoulder Instructions       General Comments      Pertinent Vitals/ Pain       Pain Assessment: Faces Faces Pain Scale: Hurts a little bit Pain Location: general Pain Descriptors /  Indicators: Sore Pain Intervention(s): Limited activity within patient's tolerance;Monitored during session;Repositioned  Home Living                                          Prior Functioning/Environment              Frequency  Min 2X/week        Progress Toward Goals  OT Goals(current goals can now be found in the care plan section)  Progress towards OT goals: Progressing toward goals  Acute Rehab OT Goals Patient Stated Goal: none stated; agreeable to OOB OT Goal Formulation: With patient Time For Goal Achievement: 03/12/19 Potential to Achieve Goals: Good ADL Goals Pt Will Transfer to Toilet: with min guard assist;stand pivot transfer;bedside commode(and complete hygiene with min A) Additional ADL Goal #1: pt will complete UB adls with set up/supervision from sitting Additional ADL Goal #2: pt will complete LB adls  with min A, sit to stand Additional ADL Goal #3: pt will will perform bed mobility with min guard in preparation for adls  Plan Discharge plan remains appropriate;Frequency remains appropriate    Co-evaluation    PT/OT/SLP Co-Evaluation/Treatment: Yes            AM-PAC OT "6 Clicks" Daily Activity     Outcome Measure   Help from another person eating meals?: None Help from another person taking care of personal grooming?: A Little Help from another person toileting, which includes using toliet, bedpan, or urinal?: Total Help from another person bathing (including washing, rinsing, drying)?: A Lot Help from another person to put on and taking off regular upper body clothing?: A Little Help from another person to put on and taking off regular lower body clothing?: Total 6 Click Score: 14    End of Session    OT Visit Diagnosis: Unsteadiness on feet (R26.81);Muscle weakness (generalized) (M62.81);History of falling (Z91.81)   Activity Tolerance Patient limited by lethargy   Patient Left with call bell/phone within reach;in bed;with bed alarm set   Nurse Communication Mobility status        Time: 1108-1130 OT Time Calculation (min): 22 min  Charges: OT General Charges $OT Visit: 1 Visit OT Treatments $Self Care/Home Management : 8-22 mins  Minus Breeding, MSOT, OTR/L  Supplemental Rehabilitation Services  [phone]    Marius Ditch 03/03/2019, 11:50 AM

## 2019-03-03 NOTE — TOC Progression Note (Addendum)
Transition of Care [facility]) - Progression Note    Patient Details  Name: Craig Whitehead MRN: [id number] Date of Birth: 1927-08-13  Transition of Care [place] [facility]) CM/SW Contact  Shade Flood, LCSW Phone Number: 03/03/2019, 12:49 PM  Clinical Narrative:     TOC following. Discussed pt's status with MD who states pt is not yet medically stable for dc. Plan is for dc to [facility] when stable. Spoke with Creta Levin at [facility] to inquire if authorization request was initiated. Per Creta Levin, she was finally able to get it to go through last evening.   Also updated Dorian Pod from [facility] as they were following pt before admission. [facility] will keep in touch with [facility] to determine if pt will return home with [facility] after rehab.  TOC will follow and continue to assist with dc planning.  Expected Discharge Plan: [place] Barriers to Discharge: Continued Medical Work up  Expected Discharge Plan and Services Expected Discharge Plan: [place]     Post Acute Care Choice: NA                                         Social Determinants of Health (SDOH) Interventions    Readmission Risk Interventions No flowsheet data found.

## 2019-03-03 NOTE — Progress Notes (Signed)
PROGRESS NOTE    Craig Whitehead  EGB:[id number] DOB: [birth year] DOA: 02/25/2019 PCP: Josetta Huddle, MD    Brief Narrative: 85 year old man with prior history of type II DM, normal pressure hydrocephalus, bladder cancer, permanent atrial fibrillation presents to ED for generalized weakness and a fall and atrial fibrillation with RVR.  Initial CT of the head and cervical spine and lumbar spine x-rays are unremarkable. He was found to have a urinary tract infection and AKI on stage III CKD  Assessment & Plan:   Active Problems:   AKI (acute kidney injury) ([place])   Acute on stage III CKD with metabolic acidosis Probably secondary to dehydration and poor oral intake Improving with hydration but not back to baseline yet. Bicarb has improved to 20.  Anion gap is closed Continue with gentle hydration and repeat renal parameters in the morning    Atrial fibrillation with RVR ChadsVASC SCORE is 2.  Patient is only on aspirin for anticoagulation and not on any blood thinners probably secondary to history of falls.   Sepsis secondary to urinary tract infection Urine cultures are pending and showed gram-negative bacteria Continue with Rocephin for now. Resume IV fluids.   Generalized weakness probably secondary to deconditioning and acute urinary tract infection and AKI PT evaluation recommending SNF. Social worker on board and waiting for a bed    QT prolongation on admission QTC has improved to 477. Keep mag greater than 2 and potassium greater than 4.    Macrocytic anemia Appears to be around 9 Currently hemoglobin is around 7 Fecal occult blood is negative. Anemia panel will be ordered for further evaluation   BPH Continue with Flomax     DVT prophylaxis: Heparin Code Status: Full code Family Communication: None at bedside Disposition Plan: Waiting for a bed at SNF  Consultants:   None.   Procedures: none.  Antimicrobials: rocephin from admission.    Subjective: Wants to know when he can be discharged from the hospital.   Objective: Vitals:   03/02/19 2028 03/03/19 0529 03/03/19 0541 03/03/19 1245  BP: 138/78 (!) 156/108  140/65  Pulse: 76 (!) 58  65  Resp: 18 18  (!) 21  Temp: 99.3 F (37.4 C) 98.1 F (36.7 C)  98.3 F (36.8 C)  TempSrc: Oral Oral  Oral  SpO2: 100% 97%  98%  Weight:   59.2 kg   Height:        Intake/Output Summary (Last 24 hours) at 03/03/2019 1652 Last data filed at 03/03/2019 1541 Gross per 24 hour  Intake 5108.32 ml  Output 3525 ml  Net 1583.32 ml   Filed Weights   03/01/19 0500 03/02/19 0526 03/03/19 0541  Weight: 54.3 kg 57.7 kg 59.2 kg    Examination:  General exam: Appears calm and comfortable  Respiratory system: Clear to auscultation. Respiratory effort normal. Cardiovascular system: S1 & S2 heard, irregularly irregular, no pedal edema. Gastrointestinal system: Abdomen is nondistended, soft and nontender. No organomegaly or masses felt. Normal bowel sounds heard. Central nervous system: Alert and oriented to place and person only, not to time. Able to answer all questions appropriately and able to move all extremities.  Extremities: Symmetric 5 x 5 power. Skin: No rashes, lesions or ulcers Psychiatry:  Mood & affect appropriate.     Data Reviewed: I have personally reviewed following labs and imaging studies  CBC: Recent Labs  Lab 02/25/19 1605  02/27/19 1002 02/28/19 0517 03/01/19 0540 03/02/19 0417 03/03/19 0456  WBC 9.8   < >  11.8* 13.5* 16.6* 7.7 5.1  NEUTROABS 8.2*  --   --   --   --   --   --   HGB 11.0*   < > 8.0* 7.8* 7.2* 7.0* 7.2*  HCT 35.9*   < > 24.5* 23.7* 21.9* 22.1* 23.2*  MCV 110.5*   < > 104.7* 104.9* 107.9* 109.4* 111.5*  PLT 256   < > 133* 131* 110* 124* 148*   < > = values in this interval not displayed.   Basic Metabolic Panel: Recent Labs  Lab 02/25/19 1605  02/27/19 0233 02/28/19 0517 03/01/19 0540 03/02/19 0417 03/03/19 0456  NA 144   < > 136  134* 134* 133* 137  K 5.3*   < > 4.3 4.5 5.0 5.3* 4.9  CL 115*   < > 105 104 106 104 108  CO2 11*   < > 21* 22 21* 21* 20*  GLUCOSE 146*   < > 149* 168* 166* 139* 167*  BUN 88*   < > 67* 67* 62* 64* 59*  CREATININE 3.45*   < > 2.42* 2.25* 2.23* 2.13* 2.02*  CALCIUM 9.2   < > 7.1* 7.3* 7.2* 7.1* 7.3*  MG 2.1  --   --   --   --   --   --    < > = values in this interval not displayed.   GFR: Estimated Creatinine Clearance: 20.8 mL/min (A) (by C-G formula based on SCr of 2.02 mg/dL (H)). Liver Function Tests: Recent Labs  Lab 02/25/19 1605 03/03/19 0456  AST 17 21  ALT 22 30  ALKPHOS 66 56  BILITOT 0.7 0.3  PROT 7.4 5.2*  ALBUMIN 4.4 2.6*   No results for input(s): LIPASE, AMYLASE in the last 168 hours. No results for input(s): AMMONIA in the last 168 hours. Coagulation Profile: Recent Labs  Lab 02/25/19 1605  INR 1.6*   Cardiac Enzymes: Recent Labs  Lab 02/25/19 1605  CKTOTAL 183   BNP (last 3 results) No results for input(s): PROBNP in the last 8760 hours. HbA1C: No results for input(s): HGBA1C in the last 72 hours. CBG: Recent Labs  Lab 03/02/19 1130 03/02/19 1648 03/02/19 2027 03/03/19 0746 03/03/19 1155  GLUCAP 271* 107* 153* 137* 161*   Lipid Profile: No results for input(s): CHOL, HDL, LDLCALC, TRIG, CHOLHDL, LDLDIRECT in the last 72 hours. Thyroid Function Tests: No results for input(s): TSH, T4TOTAL, FREET4, T3FREE, THYROIDAB in the last 72 hours. Anemia Panel: No results for input(s): VITAMINB12, FOLATE, FERRITIN, TIBC, IRON, RETICCTPCT in the last 72 hours. Sepsis Labs: Recent Labs  Lab 02/25/19 1605 03/01/19 1413 03/01/19 1710  LATICACIDVEN 1.5 1.6 1.3    Recent Results (from the past 240 hour(s))  Culture, Urine     Status: Abnormal   Collection Time: 02/26/19  5:04 AM   Specimen: Urine, Clean Catch  Result Value Ref Range Status   Specimen Description   Final    URINE, CLEAN CATCH Performed at [facility], [place] [address], [place], [place] [address]    Special Requests   Final    Normal Performed at [facility], [place] [address] [place], [place] [address]    Culture (A)  Final    <10,000 COLONIES/mL INSIGNIFICANT GROWTH Performed at [place] [place] [address], [place], [place] [address]    Report Status 02/27/2019 FINAL  Final  MRSA PCR Screening     Status: None   Collection Time: 02/26/19  8:45  PM   Specimen: Nasopharyngeal  Result Value Ref Range Status   MRSA by PCR NEGATIVE NEGATIVE Final    Comment:        The GeneXpert MRSA Assay (FDA approved for NASAL specimens only), is one component of a comprehensive MRSA colonization surveillance program. It is not intended to diagnose MRSA infection nor to guide or monitor treatment for MRSA infections. Performed at [facility], [place] [address] [place], [place] [address]   SARS Coronavirus 2 [facility] order, Performed in [facility] hospital lab)     Status: None   Collection Time: 02/26/19 11:28 PM  Result Value Ref Range Status   SARS Coronavirus 2 NEGATIVE NEGATIVE Final    Comment: (NOTE) If result is NEGATIVE SARS-CoV-2 target nucleic acids are NOT DETECTED. The SARS-CoV-2 RNA is generally detectable in upper and lower  respiratory specimens during the acute phase of infection. The lowest  concentration of SARS-CoV-2 viral copies this assay can detect is 250  copies / mL. A negative result does not preclude SARS-CoV-2 infection  and should not be used as the sole basis for treatment or other  patient management decisions.  A negative result may occur with  improper specimen collection / handling, submission of specimen other  than nasopharyngeal swab, presence of viral mutation(s) within the  areas targeted by this assay, and inadequate number of viral copies  (<250 copies / mL). A negative result must be combined with clinical  observations, patient history, and epidemiological  information. If result is POSITIVE SARS-CoV-2 target nucleic acids are DETECTED. The SARS-CoV-2 RNA is generally detectable in upper and lower  respiratory specimens dur ing the acute phase of infection.  Positive  results are indicative of active infection with SARS-CoV-2.  Clinical  correlation with patient history and other diagnostic information is  necessary to determine patient infection status.  Positive results do  not rule out bacterial infection or co-infection with other viruses. If result is PRESUMPTIVE POSTIVE SARS-CoV-2 nucleic acids MAY BE PRESENT.   A presumptive positive result was obtained on the submitted specimen  and confirmed on repeat testing.  While 2019 novel coronavirus  (SARS-CoV-2) nucleic acids may be present in the submitted sample  additional confirmatory testing may be necessary for epidemiological  and / or clinical management purposes  to differentiate between  SARS-CoV-2 and other Sarbecovirus currently known to infect humans.  If clinically indicated additional testing with an alternate test  methodology [phone]) is advised. The SARS-CoV-2 RNA is generally  detectable in upper and lower respiratory sp ecimens during the acute  phase of infection. The expected result is Negative. Fact Sheet for Patients:  StrictlyIdeas.no Fact Sheet for Healthcare Providers: BankingDealers.co.za This test is not yet approved or cleared by the [place] FDA and has been authorized for detection and/or diagnosis of SARS-CoV-2 by FDA under an Emergency Use Authorization (EUA).  This EUA will remain in effect (meaning this test can be used) for the duration of the COVID-19 declaration under Section 564(b)(1) of the Act, 21 U.S.C. section 360bbb-3(b)(1), unless the authorization is terminated or revoked sooner. Performed at [facility], [place] [address] [place], [place] [address]   Culture, blood  (routine x 2)     Status: Abnormal   Collection Time: 02/28/19  8:05 PM   Specimen: BLOOD LEFT FOREARM  Result Value Ref Range Status   Specimen Description   Final    BLOOD LEFT FOREARM Performed at [facility], [place]  [address] [place], [place] [address]    Special Requests   Final    BOTTLES DRAWN AEROBIC ONLY Blood Culture adequate volume Performed at [place] [address], [place], [place] [address]    Culture  Setup Time   Final    AEROBIC BOTTLE ONLY GRAM POSITIVE COCCI CRITICAL RESULT CALLED TO, READ BACK BY AND VERIFIED WITH: Shelda Jakes [facility] 03/01/19 2352 JDW    Culture (A)  Final    STAPHYLOCOCCUS SPECIES (COAGULASE NEGATIVE) THE SIGNIFICANCE OF ISOLATING THIS ORGANISM FROM A SINGLE SET OF BLOOD CULTURES WHEN MULTIPLE SETS ARE DRAWN IS UNCERTAIN. PLEASE NOTIFY THE MICROBIOLOGY DEPARTMENT WITHIN ONE WEEK IF SPECIATION AND SENSITIVITIES ARE REQUIRED. Performed at [facility], [place] [address] [place], [place] [address]    Report Status 03/03/2019 FINAL  Final  Culture, blood (routine x 2)     Status: None (Preliminary result)   Collection Time: 02/28/19  8:05 PM   Specimen: BLOOD  Result Value Ref Range Status   Specimen Description   Final    BLOOD LEFT ANTECUBITAL Performed at [place] [address] [place], [place] [address]    Special Requests   Final    BOTTLES DRAWN AEROBIC ONLY Blood Culture adequate volume Performed at [place] [address] [place], [place] [address]    Culture   Final    NO GROWTH 2 DAYS Performed at [place] [address], [place], [place] [address]    Report Status PENDING  Incomplete  Blood Culture ID Panel (Reflexed)     Status: Abnormal   Collection Time: 02/28/19  8:05 PM  Result Value Ref Range Status   Enterococcus species NOT DETECTED NOT DETECTED Final   Listeria monocytogenes NOT DETECTED NOT DETECTED Final   Staphylococcus  species DETECTED (A) NOT DETECTED Final    Comment: Methicillin (oxacillin) susceptible coagulase negative staphylococcus. Possible blood culture contaminant (unless isolated from more than one blood culture draw or clinical case suggests pathogenicity). No antibiotic treatment is indicated for blood  culture contaminants. CRITICAL RESULT CALLED TO, READ BACK BY AND VERIFIED WITH: Shelda Jakes [facility] 03/01/19 2352 JDW    Staphylococcus aureus (BCID) NOT DETECTED NOT DETECTED Final   Methicillin resistance NOT DETECTED NOT DETECTED Final   Streptococcus species NOT DETECTED NOT DETECTED Final   Streptococcus agalactiae NOT DETECTED NOT DETECTED Final   Streptococcus pneumoniae NOT DETECTED NOT DETECTED Final   Streptococcus pyogenes NOT DETECTED NOT DETECTED Final   Acinetobacter baumannii NOT DETECTED NOT DETECTED Final   Enterobacteriaceae species NOT DETECTED NOT DETECTED Final   Enterobacter cloacae complex NOT DETECTED NOT DETECTED Final   Escherichia coli NOT DETECTED NOT DETECTED Final   Klebsiella oxytoca NOT DETECTED NOT DETECTED Final   Klebsiella pneumoniae NOT DETECTED NOT DETECTED Final   Proteus species NOT DETECTED NOT DETECTED Final   Serratia marcescens NOT DETECTED NOT DETECTED Final   Haemophilus influenzae NOT DETECTED NOT DETECTED Final   Neisseria meningitidis NOT DETECTED NOT DETECTED Final   Pseudomonas aeruginosa NOT DETECTED NOT DETECTED Final   Candida albicans NOT DETECTED NOT DETECTED Final   Candida glabrata NOT DETECTED NOT DETECTED Final   Candida krusei NOT DETECTED NOT DETECTED Final   Candida parapsilosis NOT DETECTED NOT DETECTED Final   Candida tropicalis NOT DETECTED NOT DETECTED Final    Comment: Performed at [facility], [address], [place], [place] [address]  Culture, Urine     Status:  Abnormal (Preliminary result)   Collection Time: 03/01/19  9:01 AM   Specimen: Urine, Clean Catch  Result Value Ref Range Status   Specimen Description    Final    URINE, CLEAN CATCH Performed at [facility], [place] [address] [place], [place] [address]    Special Requests   Final    NONE Performed at [facility], [place] [address] [place], [place] [address]    Culture (A)  Final    >=100,000 COLONIES/mL ESCHERICHIA COLI >=100,000 COLONIES/mL PROTEUS MIRABILIS SUSCEPTIBILITIES TO FOLLOW Performed at [place] [address], [place], [place] [address]    Report Status PENDING  Incomplete         Radiology Studies: No results found.      Scheduled Meds: . aspirin EC  81 mg Oral Daily  . feeding supplement (ENSURE ENLIVE)  237 mL Oral Q24H  . feeding supplement (GLUCERNA SHAKE)  237 mL Oral TID BM  . folic acid  1 mg Oral Daily  . heparin injection (subcutaneous)  5,000 Units Subcutaneous Q8H  . insulin aspart  0-5 Units Subcutaneous QHS  . insulin aspart  0-9 Units Subcutaneous TID WC  . mouth rinse  15 mL Mouth Rinse BID  . sodium bicarbonate  650 mg Oral Daily  . tamsulosin  0.4 mg Oral Daily  . thiamine  100 mg Oral Daily   Continuous Infusions: . sodium chloride 75 mL/hr at 03/03/19 1400  . cefTRIAXone (ROCEPHIN)  IV 2 g (03/03/19 0105)     LOS: 6 days    Time spent: 34 minutes.     Hosie Poisson, MD [facility] Pager [phone]   If 7PM-7AM, please contact night-coverage [email or web address] Password [facility] 03/03/2019, 4:52 PM

## 2019-03-04 ENCOUNTER — Inpatient Hospital Stay (HOSPITAL_COMMUNITY): Payer: Medicare HMO

## 2019-03-04 LAB — CBC
HCT: 23.1 % — ABNORMAL LOW (ref 39.0–52.0)
Hemoglobin: 7.2 g/dL — ABNORMAL LOW (ref 13.0–17.0)
MCH: 34.1 pg — ABNORMAL HIGH (ref 26.0–34.0)
MCHC: 31.2 g/dL (ref 30.0–36.0)
MCV: 109.5 fL — ABNORMAL HIGH (ref 80.0–100.0)
Platelets: 176 10*3/uL (ref 150–400)
RBC: 2.11 MIL/uL — ABNORMAL LOW (ref 4.22–5.81)
RDW: 14.6 % (ref 11.5–15.5)
WBC: 5.1 10*3/uL (ref 4.0–10.5)
nRBC: 0 % (ref 0.0–0.2)

## 2019-03-04 LAB — URINE CULTURE: Culture: >=100000 — AB

## 2019-03-04 LAB — FERRITIN: Ferritin: 275 ng/mL (ref 24–336)

## 2019-03-04 LAB — BASIC METABOLIC PANEL
Anion gap: 11 (ref 5–15)
BUN: 58 mg/dL — ABNORMAL HIGH (ref 8–23)
CO2: 17 mmol/L — ABNORMAL LOW (ref 22–32)
Calcium: 7.6 mg/dL — ABNORMAL LOW (ref 8.9–10.3)
Chloride: 110 mmol/L (ref 98–111)
Creatinine, Ser: 1.79 mg/dL — ABNORMAL HIGH (ref 0.61–1.24)
GFR calc Af Amer: 38 mL/min — ABNORMAL LOW (ref >60)
GFR calc non Af Amer: 33 mL/min — ABNORMAL LOW (ref >60)
Glucose, Bld: 194 mg/dL — ABNORMAL HIGH (ref 70–99)
Potassium: 4.6 mmol/L (ref 3.5–5.1)
Sodium: 138 mmol/L (ref 135–145)

## 2019-03-04 LAB — IRON AND TIBC
Iron: 67 ug/dL (ref 45–182)
Saturation Ratios: 38 % (ref 17.9–39.5)
TIBC: 176 ug/dL — ABNORMAL LOW (ref 250–450)
UIBC: 109 ug/dL

## 2019-03-04 LAB — FOLATE: Folate: 12.3 ng/mL (ref >5.9)

## 2019-03-04 LAB — RETICULOCYTES
Immature Retic Fract: 20.6 % — ABNORMAL HIGH (ref 2.3–15.9)
RBC.: 2.11 MIL/uL — ABNORMAL LOW (ref 4.22–5.81)
Retic Count, Absolute: 25.1 10*3/uL (ref 19.0–186.0)
Retic Ct Pct: 1.2 % (ref 0.4–3.1)

## 2019-03-04 LAB — MAGNESIUM: Magnesium: 1.2 mg/dL — ABNORMAL LOW (ref 1.7–2.4)

## 2019-03-04 LAB — OCCULT BLOOD X 1 CARD TO LAB, STOOL: Fecal Occult Bld: NEGATIVE

## 2019-03-04 LAB — GLUCOSE, CAPILLARY
Glucose-Capillary: 109 mg/dL — ABNORMAL HIGH (ref 70–99)
Glucose-Capillary: 204 mg/dL — ABNORMAL HIGH (ref 70–99)
Glucose-Capillary: 119 mg/dL — ABNORMAL HIGH (ref 70–99)
Glucose-Capillary: 145 mg/dL — ABNORMAL HIGH (ref 70–99)

## 2019-03-04 LAB — VITAMIN B12: Vitamin B-12: 288 pg/mL (ref 180–914)

## 2019-03-04 LAB — AMMONIA: Ammonia: 24 umol/L (ref 9–35)

## 2019-03-04 MED ORDER — CEPHALEXIN 500 MG PO CAPS
500.0000 mg | ORAL_CAPSULE | Freq: Two times a day (BID) | ORAL | Status: DC
  Administered 2019-03-05 – 2019-03-06 (×3): 500 mg via ORAL
  Filled 2019-03-04 (×3): qty 1

## 2019-03-04 MED ORDER — MAGNESIUM SULFATE 50 % IJ SOLN
3.0000 g | Freq: Once | INTRAVENOUS | Status: AC
  Administered 2019-03-04: 12:00:00 3 g via INTRAVENOUS
  Filled 2019-03-04: qty 6

## 2019-03-04 MED ORDER — CYANOCOBALAMIN 250 MCG PO TABS
500.0000 ug | ORAL_TABLET | Freq: Every day | ORAL | Status: DC
  Administered 2019-03-04 – 2019-03-06 (×3): 500 ug via ORAL
  Filled 2019-03-04 (×3): qty 2

## 2019-03-04 MED ORDER — CYANOCOBALAMIN 500 MCG PO TABS: 500.0000 ug | ORAL_TABLET | Freq: Every day | ORAL | 0 refills | Status: DC

## 2019-03-04 MED ORDER — CEPHALEXIN 500 MG PO CAPS: 500.0000 mg | ORAL_CAPSULE | Freq: Two times a day (BID) | ORAL | 0 refills | Status: DC

## 2019-03-04 MED ORDER — INSULIN GLARGINE 100 UNIT/ML ~~LOC~~ SOLN: 14.0000 [IU] | Freq: Every day | SUBCUTANEOUS | 0 refills | Status: DC

## 2019-03-04 MED ORDER — INSULIN ASPART 100 UNIT/ML ~~LOC~~ SOLN: SUBCUTANEOUS | 0 refills | Status: DC

## 2019-03-04 MED ORDER — ENSURE ENLIVE PO LIQD: 237.0000 mL | ORAL | 12 refills | Status: DC

## 2019-03-04 MED ORDER — SODIUM BICARBONATE 650 MG PO TABS: 650.0000 mg | ORAL_TABLET | Freq: Every day | ORAL | 0 refills | Status: DC

## 2019-03-04 MED ORDER — ASPIRIN EC 81 MG PO TBEC: 81.0000 mg | DELAYED_RELEASE_TABLET | Freq: Every day | ORAL | 3 refills | Status: DC

## 2019-03-04 NOTE — Progress Notes (Signed)
Upon entering room this RN noticed that the patient appeared SOB. The pt stated that he has been SOB on and off since yesterday. MD Karleen Hampshire at bedside notified and placed orders to d/c IV fluids and obtain CXR. Lung sounds clear in all fields. Vital signs obtained and stable. Will continue to monitor.

## 2019-03-04 NOTE — Progress Notes (Signed)
Physical Therapy Treatment Patient Details Name: Craig Whitehead MRN: [id number] DOB: 01-02-1928 Today's Date: 03/04/2019    History of Present Illness 85 year old man who was admitted after a fall from bed and found to have acute kidney infection.  PMH:  permanent AFIb, DM, normal pressure hydrocephalus, bladder CA, CHF.  He reports that legs give out    PT Comments    Pt in bed sleeping but easily aroused.  Appears more pale and sucken/fragile. Assisted OOB while taking BP's. Supine    BP 101/59    HR 86 EOB       BP  98/77      HR 95 Standing BP 107/56     HR 117 3 min      BP 109/66     HR 102 Very weak.  Required + 2 assist to amb.  General Gait Details: decreased distance with increased c/o weakness.  poor flex cervical posture.  Limited activity tolerance.   Assisted back to bed per pt request.  Pt stated he feeld "wore out".  Pt will need SNF  Follow Up Recommendations  SNF     Equipment Recommendations  None recommended by PT    Recommendations for Other Services       Precautions / Restrictions Precautions Precautions: Fall Restrictions Weight Bearing Restrictions: No    Mobility  Bed Mobility Overal bed mobility: Needs Assistance Bed Mobility: Supine to Sit;Sit to Supine     Supine to sit: Mod assist;Max assist Sit to supine: Max assist;Total assist   General bed mobility comments: required increased assist this session.  Max c/o wealness.  Once upright, pt was able to static sit EOB x 2 min before requesting to "lay back down"  Transfers Overall transfer level: Needs assistance Equipment used: Rolling walker (2 wheeled)   Sit to Stand: Min assist;+2 safety/equipment;Mod assist;+2 physical assistance Stand pivot transfers: +2 physical assistance;+2 safety/equipment;Mod assist;Max assist       General transfer comment: 25% VC's on proper hand placement assist with sit to stand required increased assist this session vs  last.  Ambulation/Gait Ambulation/Gait assistance: Mod assist;+2 safety/equipment;+2 physical assistance Gait Distance (Feet): 4 Feet Assistive device: Rolling walker (2 wheeled) Gait Pattern/deviations: Shuffle;Decreased step length - right;Decreased step length - left;Step-to pattern;Trunk flexed Gait velocity: decreased   General Gait Details: decreased distance with increased c/o weakness.  poor flex cervical posture.  Limited activity tolerance.   Stairs             Wheelchair Mobility    Modified Rankin (Stroke Patients Only)       Balance                                            Cognition Arousal/Alertness: Awake/alert Behavior During Therapy: WFL for tasks assessed/performed                                   General Comments: AxOx4  pt stated he fells "wore out" otherwise cooperative      Exercises      General Comments        Pertinent Vitals/Pain Pain Assessment: No/denies pain    Home Living                      Prior Function  PT Goals (current goals can now be found in the care plan section) Progress towards PT goals: Progressing toward goals    Frequency    Min 2X/week      PT Plan Current plan remains appropriate    Co-evaluation              AM-PAC PT "6 Clicks" Mobility   Outcome Measure  Help needed turning from your back to your side while in a flat bed without using bedrails?: A Lot Help needed moving from lying on your back to sitting on the side of a flat bed without using bedrails?: A Lot Help needed moving to and from a bed to a chair (including a wheelchair)?: A Lot Help needed standing up from a chair using your arms (e.g., wheelchair or bedside chair)?: A Lot Help needed to walk in hospital room?: A Lot Help needed climbing 3-5 steps with a railing? : Total 6 Click Score: 11    End of Session Equipment Utilized During Treatment: Gait belt Activity  Tolerance: Patient limited by fatigue Patient left: in bed;with bed alarm set Nurse Communication: Mobility status PT Visit Diagnosis: Unsteadiness on feet (R26.81);Muscle weakness (generalized) (M62.81);Difficulty in walking, not elsewhere classified (R26.2);History of falling (Z91.81)     Time: 8099-8338 PT Time Calculation (min) (ACUTE ONLY): 32 min  Charges:  $Gait Training: 8-22 mins $Therapeutic Activity: 8-22 mins                     Rica Koyanagi  PTA Acute  Rehabilitation Services Pager      [phone] Office      [phone]

## 2019-03-04 NOTE — Progress Notes (Signed)
PROGRESS NOTE    Craig Whitehead  YSA:[id number] DOB: 11-Oct-1927 DOA: 02/25/2019 PCP: Josetta Huddle, MD    Brief Narrative: 85 year old man with prior history of type II DM, normal pressure hydrocephalus, bladder cancer, permanent atrial fibrillation presents to ED for generalized weakness and a fall and atrial fibrillation with RVR.  Initial CT of the head and cervical spine and lumbar spine x-rays are unremarkable. He was found to have a urinary tract infection and AKI on stage III CKD. Today patient is more lethargic, answering questions more slowly and physical therapist also reports patient is more weaker today when compared to 2 days ago.  Labs revealed low magnesium.  Assessment & Plan:   Active Problems:   AKI (acute kidney injury) ([place])   Acute on stage III CKD with metabolic acidosis Probably secondary to dehydration and poor oral intake.  Improving with hydration but not back to baseline yet.  Today patient is more lethargic, answering questions more slowly and appears more weaker.  Physical therapist evaluation requested. Bicarb has decreased to 17, but creatinine has improved to 1.79, anion gap is closed Continue with gentle hydration and repeat renal parameters in the morning.  Recommend adding sodium bicarbonate tablets.    Acute encephalopathy, suspect probably metabolic Get CT head without contrast, get ammonia levels.  Reviewed vitamin N23 and folic acid levels.  Get TSH and restart the fluids.  Atrial fibrillation with RVR ChadsVASC SCORE is 2.  Patient is only on aspirin for anticoagulation and not on any blood thinners probably secondary to history of falls.   Sepsis secondary to urinary tract infection Urine cultures are pending and showed Proteus and E. coli IV Rocephin changed to Keflex today Resume IV fluids.   Generalized weakness probably secondary to deconditioning and acute urinary tract infection and AKI PT evaluation recommending SNF. Social  worker on board and patient has a bed waiting for clinical improvement and will probably discharge the patient tomorrow.    QT prolongation on admission QTC has improved to 477. Keep mag greater than 2 and potassium greater than 4.    Macrocytic anemia Baseline appears to be around 9 Currently hemoglobin is around 7 and stable no obvious signs of bleeding Fecal occult blood is negative. Anemia panel showed low normal vitamin B12 levels, vitamin B12 supplementation added.   BPH Continue with Flomax  Hypomagnesemia Replaced Repeat in the morning   DVT prophylaxis: Heparin Code Status: Full code Family Communication: None at bedside Disposition Plan: DC to SNF in the morning  Consultants:   None.   Procedures: none.  Antimicrobials: rocephin from admission.  Transition to Keflex today  Subjective: Patient is lethargic slower to answer questions.  Patient denies chest pain.  Objective: Vitals:   03/04/19 0358 03/04/19 0547 03/04/19 1025 03/04/19 1243  BP:  (!) 157/88 102/64 110/75  Pulse:  97 93 72  Resp:  18 13 18   Temp:  99.4 F (37.4 C) 98.3 F (36.8 C) 98 F (36.7 C)  TempSrc:  Oral Axillary Oral  SpO2:  97% 99% 97%  Weight: 59.4 kg     Height:        Intake/Output Summary (Last 24 hours) at 03/04/2019 1729 Last data filed at 03/04/2019 1445 Gross per 24 hour  Intake 1471.81 ml  Output 2926 ml  Net -1454.19 ml   Filed Weights   03/02/19 0526 03/03/19 0541 03/04/19 0358  Weight: 57.7 kg 59.2 kg 59.4 kg    Examination:  General exam: Appears lethargic  respiratory system: Diminished air entry at bases no wheezing or rhonchi Cardiovascular system: S1-S2 heard, regular rate rhythm, no pedal edema Gastrointestinal system: Abdomen is soft, nontender, nondistended, bowel sounds are good Central nervous system: Lethargic and answering questions slowly, able to move all extremities but appears weak when compared to yesterday. Extremities: No pedal edema  Skin: No rashes, lesions or ulcers Psychiatry: Flat affect    Data Reviewed: I have personally reviewed following labs and imaging studies  CBC: Recent Labs  Lab 02/28/19 0517 03/01/19 0540 03/02/19 0417 03/03/19 0456 03/04/19 0448  WBC 13.5* 16.6* 7.7 5.1 5.1  HGB 7.8* 7.2* 7.0* 7.2* 7.2*  HCT 23.7* 21.9* 22.1* 23.2* 23.1*  MCV 104.9* 107.9* 109.4* 111.5* 109.5*  PLT 131* 110* 124* 148* 563   Basic Metabolic Panel: Recent Labs  Lab 02/28/19 0517 03/01/19 0540 03/02/19 0417 03/03/19 0456 03/04/19 0448  NA 134* 134* 133* 137 138  K 4.5 5.0 5.3* 4.9 4.6  CL 104 106 104 108 110  CO2 22 21* 21* 20* 17*  GLUCOSE 168* 166* 139* 167* 194*  BUN 67* 62* 64* 59* 58*  CREATININE 2.25* 2.23* 2.13* 2.02* 1.79*  CALCIUM 7.3* 7.2* 7.1* 7.3* 7.6*  MG  --   --   --   --  1.2*   GFR: Estimated Creatinine Clearance: 23.5 mL/min (A) (by C-G formula based on SCr of 1.79 mg/dL (H)). Liver Function Tests: Recent Labs  Lab 03/03/19 0456  AST 21  ALT 30  ALKPHOS 56  BILITOT 0.3  PROT 5.2*  ALBUMIN 2.6*   No results for input(s): LIPASE, AMYLASE in the last 168 hours. No results for input(s): AMMONIA in the last 168 hours. Coagulation Profile: No results for input(s): INR, PROTIME in the last 168 hours. Cardiac Enzymes: No results for input(s): CKTOTAL, CKMB, CKMBINDEX, TROPONINI in the last 168 hours. BNP (last 3 results) No results for input(s): PROBNP in the last 8760 hours. HbA1C: No results for input(s): HGBA1C in the last 72 hours. CBG: Recent Labs  Lab 03/03/19 1712 03/03/19 2012 03/04/19 0730 03/04/19 1136 03/04/19 1641  GLUCAP 129* 136* 109* 204* 119*   Lipid Profile: No results for input(s): CHOL, HDL, LDLCALC, TRIG, CHOLHDL, LDLDIRECT in the last 72 hours. Thyroid Function Tests: No results for input(s): TSH, T4TOTAL, FREET4, T3FREE, THYROIDAB in the last 72 hours. Anemia Panel: Recent Labs    03/04/19 0448  VITAMINB12 288  FOLATE 12.3  FERRITIN 275   TIBC 176*  IRON 67  RETICCTPCT 1.2   Sepsis Labs: Recent Labs  Lab 03/01/19 1413 03/01/19 1710  LATICACIDVEN 1.6 1.3    Recent Results (from the past 240 hour(s))  Culture, Urine     Status: Abnormal   Collection Time: 02/26/19  5:04 AM   Specimen: Urine, Clean Catch  Result Value Ref Range Status   Specimen Description   Final    URINE, CLEAN CATCH Performed at [facility], [place] [address] [place], [place] [address]    Special Requests   Final    Normal Performed at [facility], [place] [address], [place], [place] [address]    Culture (A)  Final    <10,000 COLONIES/mL INSIGNIFICANT GROWTH Performed at [place] [address] [place], [place] [address]    Report Status 02/27/2019 FINAL  Final  MRSA PCR Screening     Status: None   Collection Time: 02/26/19  8:45 PM   Specimen: Nasopharyngeal  Result Value Ref Range Status  MRSA by PCR NEGATIVE NEGATIVE Final    Comment:        The GeneXpert MRSA Assay (FDA approved for NASAL specimens only), is one component of a comprehensive MRSA colonization surveillance program. It is not intended to diagnose MRSA infection nor to guide or monitor treatment for MRSA infections. Performed at [facility], [place] [address] [place], [place] [address]   SARS Coronavirus 2 [facility] order, Performed in [facility] hospital lab)     Status: None   Collection Time: 02/26/19 11:28 PM  Result Value Ref Range Status   SARS Coronavirus 2 NEGATIVE NEGATIVE Final    Comment: (NOTE) If result is NEGATIVE SARS-CoV-2 target nucleic acids are NOT DETECTED. The SARS-CoV-2 RNA is generally detectable in upper and lower  respiratory specimens during the acute phase of infection. The lowest  concentration of SARS-CoV-2 viral copies this assay can detect is 250  copies / mL. A negative result does not preclude SARS-CoV-2 infection  and should not be used as the sole basis  for treatment or other  patient management decisions.  A negative result may occur with  improper specimen collection / handling, submission of specimen other  than nasopharyngeal swab, presence of viral mutation(s) within the  areas targeted by this assay, and inadequate number of viral copies  (<250 copies / mL). A negative result must be combined with clinical  observations, patient history, and epidemiological information. If result is POSITIVE SARS-CoV-2 target nucleic acids are DETECTED. The SARS-CoV-2 RNA is generally detectable in upper and lower  respiratory specimens dur ing the acute phase of infection.  Positive  results are indicative of active infection with SARS-CoV-2.  Clinical  correlation with patient history and other diagnostic information is  necessary to determine patient infection status.  Positive results do  not rule out bacterial infection or co-infection with other viruses. If result is PRESUMPTIVE POSTIVE SARS-CoV-2 nucleic acids MAY BE PRESENT.   A presumptive positive result was obtained on the submitted specimen  and confirmed on repeat testing.  While 2019 novel coronavirus  (SARS-CoV-2) nucleic acids may be present in the submitted sample  additional confirmatory testing may be necessary for epidemiological  and / or clinical management purposes  to differentiate between  SARS-CoV-2 and other Sarbecovirus currently known to infect humans.  If clinically indicated additional testing with an alternate test  methodology [phone]) is advised. The SARS-CoV-2 RNA is generally  detectable in upper and lower respiratory sp ecimens during the acute  phase of infection. The expected result is Negative. Fact Sheet for Patients:  StrictlyIdeas.no Fact Sheet for Healthcare Providers: BankingDealers.co.za This test is not yet approved or cleared by the [place] FDA and has been authorized for detection and/or  diagnosis of SARS-CoV-2 by FDA under an Emergency Use Authorization (EUA).  This EUA will remain in effect (meaning this test can be used) for the duration of the COVID-19 declaration under Section 564(b)(1) of the Act, 21 U.S.C. section 360bbb-3(b)(1), unless the authorization is terminated or revoked sooner. Performed at [facility], [place] [address] [place], [place] [address]   Culture, blood (routine x 2)     Status: Abnormal   Collection Time: 02/28/19  8:05 PM   Specimen: BLOOD LEFT FOREARM  Result Value Ref Range Status   Specimen Description   Final    BLOOD LEFT FOREARM Performed at [facility], [place] [address], [place], [place] [address]    Special Requests   Final  BOTTLES DRAWN AEROBIC ONLY Blood Culture adequate volume Performed at [place] [address], [place], [place] [address]    Culture  Setup Time   Final    AEROBIC BOTTLE ONLY GRAM POSITIVE COCCI CRITICAL RESULT CALLED TO, READ BACK BY AND VERIFIED WITH: Shelda Jakes [facility] 03/01/19 2352 JDW    Culture (A)  Final    STAPHYLOCOCCUS SPECIES (COAGULASE NEGATIVE) THE SIGNIFICANCE OF ISOLATING THIS ORGANISM FROM A SINGLE SET OF BLOOD CULTURES WHEN MULTIPLE SETS ARE DRAWN IS UNCERTAIN. PLEASE NOTIFY THE MICROBIOLOGY DEPARTMENT WITHIN ONE WEEK IF SPECIATION AND SENSITIVITIES ARE REQUIRED. Performed at [facility], [place] [address] [place], [place] [address]    Report Status 03/03/2019 FINAL  Final  Culture, blood (routine x 2)     Status: None (Preliminary result)   Collection Time: 02/28/19  8:05 PM   Specimen: BLOOD  Result Value Ref Range Status   Specimen Description   Final    BLOOD LEFT ANTECUBITAL Performed at [place] [address], [place], [place] [address]    Special Requests   Final    BOTTLES DRAWN AEROBIC ONLY Blood Culture adequate volume Performed at [place] [address] [place] [address] [place], [place]  [address]    Culture   Final    NO GROWTH 3 DAYS Performed at [facility], [place] [address] [place], [place] [address]    Report Status PENDING  Incomplete  Blood Culture ID Panel (Reflexed)     Status: Abnormal   Collection Time: 02/28/19  8:05 PM  Result Value Ref Range Status   Enterococcus species NOT DETECTED NOT DETECTED Final   Listeria monocytogenes NOT DETECTED NOT DETECTED Final   Staphylococcus species DETECTED (A) NOT DETECTED Final    Comment: Methicillin (oxacillin) susceptible coagulase negative staphylococcus. Possible blood culture contaminant (unless isolated from more than one blood culture draw or clinical case suggests pathogenicity). No antibiotic treatment is indicated for blood  culture contaminants. CRITICAL RESULT CALLED TO, READ BACK BY AND VERIFIED WITH: Shelda Jakes [facility] 03/01/19 2352 JDW    Staphylococcus aureus (BCID) NOT DETECTED NOT DETECTED Final   Methicillin resistance NOT DETECTED NOT DETECTED Final   Streptococcus species NOT DETECTED NOT DETECTED Final   Streptococcus agalactiae NOT DETECTED NOT DETECTED Final   Streptococcus pneumoniae NOT DETECTED NOT DETECTED Final   Streptococcus pyogenes NOT DETECTED NOT DETECTED Final   Acinetobacter baumannii NOT DETECTED NOT DETECTED Final   Enterobacteriaceae species NOT DETECTED NOT DETECTED Final   Enterobacter cloacae complex NOT DETECTED NOT DETECTED Final   Escherichia coli NOT DETECTED NOT DETECTED Final   Klebsiella oxytoca NOT DETECTED NOT DETECTED Final   Klebsiella pneumoniae NOT DETECTED NOT DETECTED Final   Proteus species NOT DETECTED NOT DETECTED Final   Serratia marcescens NOT DETECTED NOT DETECTED Final   Haemophilus influenzae NOT DETECTED NOT DETECTED Final   Neisseria meningitidis NOT DETECTED NOT DETECTED Final   Pseudomonas aeruginosa NOT DETECTED NOT DETECTED Final   Candida albicans NOT DETECTED NOT DETECTED Final   Candida glabrata NOT DETECTED NOT DETECTED Final   Candida  krusei NOT DETECTED NOT DETECTED Final   Candida parapsilosis NOT DETECTED NOT DETECTED Final   Candida tropicalis NOT DETECTED NOT DETECTED Final    Comment: Performed at [facility], [address] [place], [place] [address]  Culture, Urine     Status: Abnormal   Collection Time: 03/01/19  9:01 AM   Specimen: Urine, Clean  Catch  Result Value Ref Range Status   Specimen Description   Final    URINE, CLEAN CATCH Performed at [facility], [place] [address], [place], [place] [address]    Special Requests   Final    NONE Performed at [facility], [place] [address], [place], [place] [address]    Culture (A)  Final    >=100,000 COLONIES/mL ESCHERICHIA COLI >=100,000 COLONIES/mL PROTEUS MIRABILIS    Report Status 03/04/2019 FINAL  Final   Organism ID, Bacteria ESCHERICHIA COLI (A)  Final   Organism ID, Bacteria PROTEUS MIRABILIS (A)  Final      Susceptibility   Escherichia coli - MIC*    AMPICILLIN 8 SENSITIVE Sensitive     CEFAZOLIN <=4 SENSITIVE Sensitive     CEFTRIAXONE <=1 SENSITIVE Sensitive     CIPROFLOXACIN <=0.25 SENSITIVE Sensitive     GENTAMICIN <=1 SENSITIVE Sensitive     IMIPENEM <=0.25 SENSITIVE Sensitive     NITROFURANTOIN <=16 SENSITIVE Sensitive     TRIMETH/SULFA <=20 SENSITIVE Sensitive     AMPICILLIN/SULBACTAM 4 SENSITIVE Sensitive     PIP/TAZO <=4 SENSITIVE Sensitive     Extended ESBL NEGATIVE Sensitive     * >=100,000 COLONIES/mL ESCHERICHIA COLI   Proteus mirabilis - MIC*    AMPICILLIN <=2 SENSITIVE Sensitive     CEFAZOLIN <=4 SENSITIVE Sensitive     CEFTRIAXONE <=1 SENSITIVE Sensitive     CIPROFLOXACIN <=0.25 SENSITIVE Sensitive     GENTAMICIN <=1 SENSITIVE Sensitive     IMIPENEM 2 SENSITIVE Sensitive     NITROFURANTOIN 128 RESISTANT Resistant     TRIMETH/SULFA <=20 SENSITIVE Sensitive     AMPICILLIN/SULBACTAM <=2 SENSITIVE Sensitive     PIP/TAZO <=4 SENSITIVE Sensitive     * >=100,000 COLONIES/mL PROTEUS  MIRABILIS         Radiology Studies: Ct Head Wo Contrast  Result Date: 03/04/2019 CLINICAL DATA:  [place] from the bed. Altered level of consciousness today. EXAM: CT HEAD WITHOUT CONTRAST TECHNIQUE: Contiguous axial images were obtained from the base of the skull through the vertex without intravenous contrast. COMPARISON:  02/25/2019 FINDINGS: Brain: Generalized atrophy. Chronic small-vessel ischemic changes of the cerebral hemispheric white matter. VP shunt placed from a right frontal approach with its tip in the right lateral ventricle near the foramen of Monro. Ventricular size is stable. No sign of acute infarction, mass lesion, change in ventricular size or extra-axial collection. Vascular: There is atherosclerotic calcification of the major vessels at the base of the brain. Skull: Negative Sinuses/Orbits: Clear/normal Other: None IMPRESSION: No acute or traumatic finding. Atrophy and chronic small-vessel ischemic changes. VP shunt appears unchanged. Ventricular size is stable. Electronically Signed   By: Nelson Chimes M.D.   On: 03/04/2019 16:31   Dg Chest Port 1 View  Result Date: 03/04/2019 CLINICAL DATA:  Generalized weakness. Fall. Tachypnea. EXAM: PORTABLE CHEST 1 VIEW COMPARISON:  Radiograph 02/25/2019 FINDINGS: Unchanged heart size and mediastinal contours. Tortuous thoracic aorta as before. No pulmonary edema, focal airspace disease, large pleural effusion or pneumothorax. Chronic reticular left lung base opacities, likely scarring. Shunt catheter tubing courses over the right chest. The bones are under mineralized. Skin folds project over the left chest wall. IMPRESSION: No acute chest finding. Electronically Signed   By: Keith Rake M.D.   On: 03/04/2019 13:39        Scheduled Meds: . aspirin EC  81 mg Oral Daily  . [START ON 03/05/2019] cephALEXin  500 mg Oral Q12H  .  feeding supplement (ENSURE ENLIVE)  237 mL Oral Q24H  . feeding supplement (GLUCERNA SHAKE)  237 mL Oral  TID BM  . folic acid  1 mg Oral Daily  . heparin injection (subcutaneous)  5,000 Units Subcutaneous Q8H  . insulin aspart  0-5 Units Subcutaneous QHS  . insulin aspart  0-9 Units Subcutaneous TID WC  . mouth rinse  15 mL Mouth Rinse BID  . sodium bicarbonate  650 mg Oral Daily  . tamsulosin  0.4 mg Oral Daily  . thiamine  100 mg Oral Daily  . vitamin B-12  500 mcg Oral Daily   Continuous Infusions:    LOS: 7 days    Time spent: 32 minutes.     Hosie Poisson, MD [facility] Pager [phone]   If 7PM-7AM, please contact night-coverage [email or web address] Password [facility] 03/04/2019, 5:29 PM

## 2019-03-04 NOTE — Care Management Important Message (Signed)
Important Message  Patient Details IM Letter given to Sharren Bridge SW to present to the Patient Name: Craig Whitehead MRN: [id number] Date of Birth: Oct 17, 1927   Medicare Important Message Given:  Yes     Kerin Salen 03/04/2019, 10:49 AM

## 2019-03-05 LAB — BASIC METABOLIC PANEL
Anion gap: 8 (ref 5–15)
BUN: 57 mg/dL — ABNORMAL HIGH (ref 8–23)
CO2: 22 mmol/L (ref 22–32)
Calcium: 8.1 mg/dL — ABNORMAL LOW (ref 8.9–10.3)
Chloride: 106 mmol/L (ref 98–111)
Creatinine, Ser: 1.79 mg/dL — ABNORMAL HIGH (ref 0.61–1.24)
GFR calc Af Amer: 38 mL/min — ABNORMAL LOW (ref >60)
GFR calc non Af Amer: 33 mL/min — ABNORMAL LOW (ref >60)
Glucose, Bld: 200 mg/dL — ABNORMAL HIGH (ref 70–99)
Potassium: 5.2 mmol/L — ABNORMAL HIGH (ref 3.5–5.1)
Sodium: 136 mmol/L (ref 135–145)

## 2019-03-05 LAB — CBC WITH DIFFERENTIAL/PLATELET
Abs Immature Granulocytes: 0.17 10*3/uL — ABNORMAL HIGH (ref 0.00–0.07)
Basophils Absolute: 0 10*3/uL (ref 0.0–0.1)
Basophils Relative: 1 %
Eosinophils Absolute: 0.1 10*3/uL (ref 0.0–0.5)
Eosinophils Relative: 3 %
HCT: 23.7 % — ABNORMAL LOW (ref 39.0–52.0)
Hemoglobin: 7.5 g/dL — ABNORMAL LOW (ref 13.0–17.0)
Immature Granulocytes: 3 %
Lymphocytes Relative: 25 %
Lymphs Abs: 1.3 10*3/uL (ref 0.7–4.0)
MCH: 34.7 pg — ABNORMAL HIGH (ref 26.0–34.0)
MCHC: 31.6 g/dL (ref 30.0–36.0)
MCV: 109.7 fL — ABNORMAL HIGH (ref 80.0–100.0)
Monocytes Absolute: 0.6 10*3/uL (ref 0.1–1.0)
Monocytes Relative: 12 %
Neutro Abs: 2.9 10*3/uL (ref 1.7–7.7)
Neutrophils Relative %: 56 %
Platelets: 190 10*3/uL (ref 150–400)
RBC: 2.16 MIL/uL — ABNORMAL LOW (ref 4.22–5.81)
RDW: 14.6 % (ref 11.5–15.5)
WBC: 5.2 10*3/uL (ref 4.0–10.5)
nRBC: 0 % (ref 0.0–0.2)

## 2019-03-05 LAB — GLUCOSE, CAPILLARY
Glucose-Capillary: 113 mg/dL — ABNORMAL HIGH (ref 70–99)
Glucose-Capillary: 203 mg/dL — ABNORMAL HIGH (ref 70–99)
Glucose-Capillary: 136 mg/dL — ABNORMAL HIGH (ref 70–99)
Glucose-Capillary: 127 mg/dL — ABNORMAL HIGH (ref 70–99)

## 2019-03-05 LAB — MAGNESIUM: Magnesium: 2.1 mg/dL (ref 1.7–2.4)

## 2019-03-05 LAB — POTASSIUM: Potassium: 5.4 mmol/L — ABNORMAL HIGH (ref 3.5–5.1)

## 2019-03-05 MED ORDER — SODIUM CHLORIDE 0.9 % IV SOLN: INTRAVENOUS | Status: DC

## 2019-03-05 NOTE — Progress Notes (Addendum)
PROGRESS NOTE    Craig Whitehead  OIZ:[id number] DOB: [birth year] DOA: 02/25/2019 PCP: Josetta Huddle, MD    Brief Narrative: 85 year old man with prior history of type II DM, normal pressure hydrocephalus, bladder cancer, permanent atrial fibrillation presents to ED for generalized weakness and a fall and atrial fibrillation with RVR.  Initial CT of the head and cervical spine and lumbar spine x-rays are unremarkable. He was found to have a urinary tract infection and AKI on stage III CKD. Patient is more alert today and he had another 2-second pause this morning on telemetry for which cardiology was consulted.  Assessment & Plan:   Active Problems:   Persistent atrial fibrillation ([place]): CHA2DS2Vasc 2 (age). Rate controlled without medications asymptomatic   Weakness   Protein-calorie malnutrition, severe   AKI (acute kidney injury) ([place])   Chronic atrial fibrillation   Acute on stage III CKD with metabolic acidosis Probably secondary to dehydration and poor oral intake.  Improving with hydration and creatinine stabilized at 1.79.  Bicarb wnl, ag is closed.  Suspect this is his baseline .   Acute encephalopathy, suspect probably metabolic Improved, probably from dehydration.  CT head negative.  Ammonia level swnl.  Vit b12 and folate levels to be checked.    2. 5 sec pause yesterday and 2 sec pause today along with bradycardia.  He was lethargic yesterday but he is more alert today and conversing.  Cardiology consulted for recommendations regarding the 2 sec pauses.   Atrial fibrillation with RVR ChadsVASC SCORE is 2.  Patient is only on aspirin for anticoagulation and not on any blood thinners probably secondary to history of falls.   Sepsis secondary to urinary tract infection Urine cultures are pending and showed Proteus and E. coli IV Rocephin changed to Keflex for discharge.    Generalized weakness probably secondary to deconditioning and acute urinary tract  infection and AKI PT evaluation recommending SNF, possibly d/c to SNF tomorrow when cleared by cardiology.     QT prolongation on admission QTC has improved to 477. Keep mag greater than 2 and potassium greater than 4.   Hypomagnesemia Replaced.    Hyperkalemia:  Slightly higher potassium this am.  Repeat K tonight. And if persistently high, will get some kayexalate and treat the hyperkalemia.     Macrocytic anemia Baseline appears to be around 9 Currently hemoglobin is around 7 and stable no obvious signs of bleeding Fecal occult blood is negative. Anemia panel showed low normal vitamin B12 levels, vitamin B12 supplementation added. Hemoglobin continues to remain at 7.5.    BPH Continue with Flomax    DVT prophylaxis: Heparin Code Status: Full code Family Communication: None at bedside Disposition Plan: DC to SNF in the morning, if nopauses and after discussing with cardiology.   Consultants:   Cardiology.   Procedures: none.  Antimicrobials: rocephin from admission.  Transition to Keflex today  Subjective: More alert and denies any new complaints today.   Objective: Vitals:   03/04/19 2135 03/05/19 0500 03/05/19 0616 03/05/19 1314  BP: 115/84  102/71 97/67  Pulse: (!) 55  (!) 107 76  Resp: 16  18 18   Temp: 98 F (36.7 C)  98.2 F (36.8 C) 98.2 F (36.8 C)  TempSrc: Oral  Oral Oral  SpO2: 97%  96% 98%  Weight:  57.9 kg    Height:        Intake/Output Summary (Last 24 hours) at 03/05/2019 1659 Last data filed at 03/05/2019 1315 Gross per 24 hour  Intake 1680 ml  Output 2400 ml  Net -720 ml   Filed Weights   03/03/19 0541 03/04/19 0358 03/05/19 0500  Weight: 59.2 kg 59.4 kg 57.9 kg    Examination:  General exam:not in distress today.  respiratory system: clear to auscultation, no wheezin gor rhonchi.  Cardiovascular system: S1-S2 heard,irregular, no pedal edema.  Gastrointestinal system: Abdomen is soft, NT ND BS+ Central nervous system:  ALERT today and answering questions appropriately.  Extremities: No pedal edema, no cyanosis.  Skin: No rashes, lesions or ulcers Psychiatry: mood appropriate.     Data Reviewed: I have personally reviewed following labs and imaging studies  CBC: Recent Labs  Lab 03/01/19 0540 03/02/19 0417 03/03/19 0456 03/04/19 0448 03/05/19 0956  WBC 16.6* 7.7 5.1 5.1 5.2  NEUTROABS  --   --   --   --  2.9  HGB 7.2* 7.0* 7.2* 7.2* 7.5*  HCT 21.9* 22.1* 23.2* 23.1* 23.7*  MCV 107.9* 109.4* 111.5* 109.5* 109.7*  PLT 110* 124* 148* 176 737   Basic Metabolic Panel: Recent Labs  Lab 03/01/19 0540 03/02/19 0417 03/03/19 0456 03/04/19 0448 03/05/19 0956  NA 134* 133* 137 138 136  K 5.0 5.3* 4.9 4.6 5.2*  CL 106 104 108 110 106  CO2 21* 21* 20* 17* 22  GLUCOSE 166* 139* 167* 194* 200*  BUN 62* 64* 59* 58* 57*  CREATININE 2.23* 2.13* 2.02* 1.79* 1.79*  CALCIUM 7.2* 7.1* 7.3* 7.6* 8.1*  MG  --   --   --  1.2* 2.1   GFR: Estimated Creatinine Clearance: 22.9 mL/min (A) (by C-G formula based on SCr of 1.79 mg/dL (H)). Liver Function Tests: Recent Labs  Lab 03/03/19 0456  AST 21  ALT 30  ALKPHOS 56  BILITOT 0.3  PROT 5.2*  ALBUMIN 2.6*   No results for input(s): LIPASE, AMYLASE in the last 168 hours. Recent Labs  Lab 03/04/19 1654  AMMONIA 24   Coagulation Profile: No results for input(s): INR, PROTIME in the last 168 hours. Cardiac Enzymes: No results for input(s): CKTOTAL, CKMB, CKMBINDEX, TROPONINI in the last 168 hours. BNP (last 3 results) No results for input(s): PROBNP in the last 8760 hours. HbA1C: No results for input(s): HGBA1C in the last 72 hours. CBG: Recent Labs  Lab 03/04/19 1641 03/04/19 2207 03/05/19 0834 03/05/19 1134 03/05/19 1636  GLUCAP 119* 145* 113* 203* 136*   Lipid Profile: No results for input(s): CHOL, HDL, LDLCALC, TRIG, CHOLHDL, LDLDIRECT in the last 72 hours. Thyroid Function Tests: No results for input(s): TSH, T4TOTAL, FREET4,  T3FREE, THYROIDAB in the last 72 hours. Anemia Panel: Recent Labs    03/04/19 0448  VITAMINB12 288  FOLATE 12.3  FERRITIN 275  TIBC 176*  IRON 67  RETICCTPCT 1.2   Sepsis Labs: Recent Labs  Lab 03/01/19 1413 03/01/19 1710  LATICACIDVEN 1.6 1.3    Recent Results (from the past 240 hour(s))  Culture, Urine     Status: Abnormal   Collection Time: 02/26/19  5:04 AM   Specimen: Urine, Clean Catch  Result Value Ref Range Status   Specimen Description   Final    URINE, CLEAN CATCH Performed at [facility], [place] [address], [place], [place] [address]    Special Requests   Final    Normal Performed at [facility], [place] [address], [place], [place] [address]    Culture (A)  Final    <10,000 COLONIES/mL INSIGNIFICANT GROWTH Performed at [facility], [address] [place], [place] [address]    Report Status 02/27/2019 FINAL  Final  MRSA PCR Screening     Status: None   Collection Time: 02/26/19  8:45 PM   Specimen: Nasopharyngeal  Result Value Ref Range Status   MRSA by PCR NEGATIVE NEGATIVE Final    Comment:        The GeneXpert MRSA Assay (FDA approved for NASAL specimens only), is one component of a comprehensive MRSA colonization surveillance program. It is not intended to diagnose MRSA infection nor to guide or monitor treatment for MRSA infections. Performed at [facility], [place] [address], [place], [place] [address]   SARS Coronavirus 2 [facility] order, Performed in [facility] hospital lab)     Status: None   Collection Time: 02/26/19 11:28 PM  Result Value Ref Range Status   SARS Coronavirus 2 NEGATIVE NEGATIVE Final    Comment: (NOTE) If result is NEGATIVE SARS-CoV-2 target nucleic acids are NOT DETECTED. The SARS-CoV-2 RNA is generally detectable in upper and lower  respiratory specimens during the acute phase of infection. The lowest  concentration of SARS-CoV-2 viral copies this assay  can detect is 250  copies / mL. A negative result does not preclude SARS-CoV-2 infection  and should not be used as the sole basis for treatment or other  patient management decisions.  A negative result may occur with  improper specimen collection / handling, submission of specimen other  than nasopharyngeal swab, presence of viral mutation(s) within the  areas targeted by this assay, and inadequate number of viral copies  (<250 copies / mL). A negative result must be combined with clinical  observations, patient history, and epidemiological information. If result is POSITIVE SARS-CoV-2 target nucleic acids are DETECTED. The SARS-CoV-2 RNA is generally detectable in upper and lower  respiratory specimens dur ing the acute phase of infection.  Positive  results are indicative of active infection with SARS-CoV-2.  Clinical  correlation with patient history and other diagnostic information is  necessary to determine patient infection status.  Positive results do  not rule out bacterial infection or co-infection with other viruses. If result is PRESUMPTIVE POSTIVE SARS-CoV-2 nucleic acids MAY BE PRESENT.   A presumptive positive result was obtained on the submitted specimen  and confirmed on repeat testing.  While 2019 novel coronavirus  (SARS-CoV-2) nucleic acids may be present in the submitted sample  additional confirmatory testing may be necessary for epidemiological  and / or clinical management purposes  to differentiate between  SARS-CoV-2 and other Sarbecovirus currently known to infect humans.  If clinically indicated additional testing with an alternate test  methodology [phone]) is advised. The SARS-CoV-2 RNA is generally  detectable in upper and lower respiratory sp ecimens during the acute  phase of infection. The expected result is Negative. Fact Sheet for Patients:  StrictlyIdeas.no Fact Sheet for Healthcare Providers:  BankingDealers.co.za This test is not yet approved or cleared by the [place] FDA and has been authorized for detection and/or diagnosis of SARS-CoV-2 by FDA under an Emergency Use Authorization (EUA).  This EUA will remain in effect (meaning this test can be used) for the duration of the COVID-19 declaration under Section 564(b)(1) of the Act, 21 U.S.C. section 360bbb-3(b)(1), unless the authorization is terminated or revoked sooner. Performed at [facility], [place] [address], [place], [place] [address]   Culture, blood (routine x 2)     Status: Abnormal   Collection Time: 02/28/19  8:05 PM  Specimen: BLOOD LEFT FOREARM  Result Value Ref Range Status   Specimen Description   Final    BLOOD LEFT FOREARM Performed at [facility], [place] [address] [place], [place] [address]    Special Requests   Final    BOTTLES DRAWN AEROBIC ONLY Blood Culture adequate volume Performed at [place] [address], [place], [place] [address]    Culture  Setup Time   Final    AEROBIC BOTTLE ONLY GRAM POSITIVE COCCI CRITICAL RESULT CALLED TO, READ BACK BY AND VERIFIED WITH: Shelda Jakes [facility] 03/01/19 2352 JDW    Culture (A)  Final    STAPHYLOCOCCUS SPECIES (COAGULASE NEGATIVE) THE SIGNIFICANCE OF ISOLATING THIS ORGANISM FROM A SINGLE SET OF BLOOD CULTURES WHEN MULTIPLE SETS ARE DRAWN IS UNCERTAIN. PLEASE NOTIFY THE MICROBIOLOGY DEPARTMENT WITHIN ONE WEEK IF SPECIATION AND SENSITIVITIES ARE REQUIRED. Performed at [facility], [place] [address] [place], [place] [address]    Report Status 03/03/2019 FINAL  Final  Culture, blood (routine x 2)     Status: None (Preliminary result)   Collection Time: 02/28/19  8:05 PM   Specimen: BLOOD  Result Value Ref Range Status   Specimen Description   Final    BLOOD LEFT ANTECUBITAL Performed at [place] [address] [place], [place] [address]    Special  Requests   Final    BOTTLES DRAWN AEROBIC ONLY Blood Culture adequate volume Performed at [place] [address], [place],  [address]    Culture   Final    NO GROWTH 4 DAYS Performed at [facility], [place] [address] [place],  [address]    Report Status PENDING  Incomplete  Blood Culture ID Panel (Reflexed)     Status: Abnormal   Collection Time: 02/28/19  8:05 PM  Result Value Ref Range Status   Enterococcus species NOT DETECTED NOT DETECTED Final   Listeria monocytogenes NOT DETECTED NOT DETECTED Final   Staphylococcus species DETECTED (A) NOT DETECTED Final    Comment: Methicillin (oxacillin) susceptible coagulase negative staphylococcus. Possible blood culture contaminant (unless isolated from more than one blood culture draw or clinical case suggests pathogenicity). No antibiotic treatment is indicated for blood  culture contaminants. CRITICAL RESULT CALLED TO, READ BACK BY AND VERIFIED WITH: Shelda Jakes [facility] 03/01/19 2352 JDW    Staphylococcus aureus (BCID) NOT DETECTED NOT DETECTED Final   Methicillin resistance NOT DETECTED NOT DETECTED Final   Streptococcus species NOT DETECTED NOT DETECTED Final   Streptococcus agalactiae NOT DETECTED NOT DETECTED Final   Streptococcus pneumoniae NOT DETECTED NOT DETECTED Final   Streptococcus pyogenes NOT DETECTED NOT DETECTED Final   Acinetobacter baumannii NOT DETECTED NOT DETECTED Final   Enterobacteriaceae species NOT DETECTED NOT DETECTED Final   Enterobacter cloacae complex NOT DETECTED NOT DETECTED Final   Escherichia coli NOT DETECTED NOT DETECTED Final   Klebsiella oxytoca NOT DETECTED NOT DETECTED Final   Klebsiella pneumoniae NOT DETECTED NOT DETECTED Final   Proteus species NOT DETECTED NOT DETECTED Final   Serratia marcescens NOT DETECTED NOT DETECTED Final   Haemophilus influenzae NOT DETECTED NOT DETECTED Final   Neisseria meningitidis NOT DETECTED NOT DETECTED Final   Pseudomonas  aeruginosa NOT DETECTED NOT DETECTED Final   Candida albicans NOT DETECTED NOT DETECTED Final   Candida glabrata NOT DETECTED NOT DETECTED Final   Candida krusei NOT DETECTED NOT DETECTED Final   Candida parapsilosis NOT DETECTED NOT DETECTED Final  Candida tropicalis NOT DETECTED NOT DETECTED Final    Comment: Performed at [facility], [place] [address] [place], [place] [address]  Culture, Urine     Status: Abnormal   Collection Time: 03/01/19  9:01 AM   Specimen: Urine, Clean Catch  Result Value Ref Range Status   Specimen Description   Final    URINE, CLEAN CATCH Performed at [facility], [place] [address] [place], [place] [address]    Special Requests   Final    NONE Performed at [facility], [place] [address], [place], [place] [address]    Culture (A)  Final    >=100,000 COLONIES/mL ESCHERICHIA COLI >=100,000 COLONIES/mL PROTEUS MIRABILIS    Report Status 03/04/2019 FINAL  Final   Organism ID, Bacteria ESCHERICHIA COLI (A)  Final   Organism ID, Bacteria PROTEUS MIRABILIS (A)  Final      Susceptibility   Escherichia coli - MIC*    AMPICILLIN 8 SENSITIVE Sensitive     CEFAZOLIN <=4 SENSITIVE Sensitive     CEFTRIAXONE <=1 SENSITIVE Sensitive     CIPROFLOXACIN <=0.25 SENSITIVE Sensitive     GENTAMICIN <=1 SENSITIVE Sensitive     IMIPENEM <=0.25 SENSITIVE Sensitive     NITROFURANTOIN <=16 SENSITIVE Sensitive     TRIMETH/SULFA <=20 SENSITIVE Sensitive     AMPICILLIN/SULBACTAM 4 SENSITIVE Sensitive     PIP/TAZO <=4 SENSITIVE Sensitive     Extended ESBL NEGATIVE Sensitive     * >=100,000 COLONIES/mL ESCHERICHIA COLI   Proteus mirabilis - MIC*    AMPICILLIN <=2 SENSITIVE Sensitive     CEFAZOLIN <=4 SENSITIVE Sensitive     CEFTRIAXONE <=1 SENSITIVE Sensitive     CIPROFLOXACIN <=0.25 SENSITIVE Sensitive     GENTAMICIN <=1 SENSITIVE Sensitive     IMIPENEM 2 SENSITIVE Sensitive     NITROFURANTOIN 128 RESISTANT Resistant      TRIMETH/SULFA <=20 SENSITIVE Sensitive     AMPICILLIN/SULBACTAM <=2 SENSITIVE Sensitive     PIP/TAZO <=4 SENSITIVE Sensitive     * >=100,000 COLONIES/mL PROTEUS MIRABILIS         Radiology Studies: Ct Head Wo Contrast  Result Date: 03/04/2019 CLINICAL DATA:  [place] from the bed. Altered level of consciousness today. EXAM: CT HEAD WITHOUT CONTRAST TECHNIQUE: Contiguous axial images were obtained from the base of the skull through the vertex without intravenous contrast. COMPARISON:  02/25/2019 FINDINGS: Brain: Generalized atrophy. Chronic small-vessel ischemic changes of the cerebral hemispheric white matter. VP shunt placed from a right frontal approach with its tip in the right lateral ventricle near the foramen of Monro. Ventricular size is stable. No sign of acute infarction, mass lesion, change in ventricular size or extra-axial collection. Vascular: There is atherosclerotic calcification of the major vessels at the base of the brain. Skull: Negative Sinuses/Orbits: Clear/normal Other: None IMPRESSION: No acute or traumatic finding. Atrophy and chronic small-vessel ischemic changes. VP shunt appears unchanged. Ventricular size is stable. Electronically Signed   By: Nelson Chimes M.D.   On: 03/04/2019 16:31   Dg Chest Port 1 View  Result Date: 03/04/2019 CLINICAL DATA:  Generalized weakness. Fall. Tachypnea. EXAM: PORTABLE CHEST 1 VIEW COMPARISON:  Radiograph 02/25/2019 FINDINGS: Unchanged heart size and mediastinal contours. Tortuous thoracic aorta as before. No pulmonary edema, focal airspace disease, large pleural effusion or pneumothorax. Chronic reticular left lung base opacities, likely scarring. Shunt catheter tubing courses over the right chest. The bones are under mineralized. Skin folds project over the left chest wall. IMPRESSION: No acute  chest finding. Electronically Signed   By: Keith Rake M.D.   On: 03/04/2019 13:39        Scheduled Meds: . aspirin EC  81 mg Oral Daily   . cephALEXin  500 mg Oral Q12H  . feeding supplement (ENSURE ENLIVE)  237 mL Oral Q24H  . feeding supplement (GLUCERNA SHAKE)  237 mL Oral TID BM  . folic acid  1 mg Oral Daily  . heparin injection (subcutaneous)  5,000 Units Subcutaneous Q8H  . insulin aspart  0-5 Units Subcutaneous QHS  . insulin aspart  0-9 Units Subcutaneous TID WC  . mouth rinse  15 mL Mouth Rinse BID  . sodium bicarbonate  650 mg Oral Daily  . tamsulosin  0.4 mg Oral Daily  . thiamine  100 mg Oral Daily  . vitamin B-12  500 mcg Oral Daily   Continuous Infusions:    LOS: 8 days    Time spent: 31 minutes.     Hosie Poisson, MD [facility] Pager [phone]   If 7PM-7AM, please contact night-coverage [email or web address] Password [facility] 03/05/2019, 4:59 PM

## 2019-03-06 DIAGNOSIS — E860 Dehydration: Secondary | ICD-10-CM | POA: Diagnosis not present

## 2019-03-06 DIAGNOSIS — I480 Paroxysmal atrial fibrillation: Secondary | ICD-10-CM | POA: Diagnosis not present

## 2019-03-06 DIAGNOSIS — N3000 Acute cystitis without hematuria: Secondary | ICD-10-CM | POA: Diagnosis not present

## 2019-03-06 DIAGNOSIS — R001 Bradycardia, unspecified: Secondary | ICD-10-CM | POA: Diagnosis not present

## 2019-03-06 DIAGNOSIS — I482 Chronic atrial fibrillation, unspecified: Secondary | ICD-10-CM

## 2019-03-06 DIAGNOSIS — D6489 Other specified anemias: Secondary | ICD-10-CM | POA: Diagnosis not present

## 2019-03-06 DIAGNOSIS — Z794 Long term (current) use of insulin: Secondary | ICD-10-CM | POA: Diagnosis not present

## 2019-03-06 DIAGNOSIS — M6281 Muscle weakness (generalized): Secondary | ICD-10-CM | POA: Diagnosis not present

## 2019-03-06 DIAGNOSIS — Z9189 Other specified personal risk factors, not elsewhere classified: Secondary | ICD-10-CM | POA: Diagnosis not present

## 2019-03-06 DIAGNOSIS — R402411 Glasgow coma scale score 13-15, in the field [EMT or ambulance]: Secondary | ICD-10-CM | POA: Diagnosis not present

## 2019-03-06 DIAGNOSIS — Z7401 Bed confinement status: Secondary | ICD-10-CM | POA: Diagnosis not present

## 2019-03-06 DIAGNOSIS — I4891 Unspecified atrial fibrillation: Secondary | ICD-10-CM | POA: Diagnosis not present

## 2019-03-06 DIAGNOSIS — R41841 Cognitive communication deficit: Secondary | ICD-10-CM | POA: Diagnosis not present

## 2019-03-06 DIAGNOSIS — E119 Type 2 diabetes mellitus without complications: Secondary | ICD-10-CM | POA: Diagnosis not present

## 2019-03-06 DIAGNOSIS — R7309 Other abnormal glucose: Secondary | ICD-10-CM | POA: Diagnosis not present

## 2019-03-06 DIAGNOSIS — R2681 Unsteadiness on feet: Secondary | ICD-10-CM | POA: Diagnosis not present

## 2019-03-06 DIAGNOSIS — R6520 Severe sepsis without septic shock: Secondary | ICD-10-CM | POA: Diagnosis not present

## 2019-03-06 DIAGNOSIS — I469 Cardiac arrest, cause unspecified: Secondary | ICD-10-CM | POA: Diagnosis not present

## 2019-03-06 DIAGNOSIS — M2550 Pain in unspecified joint: Secondary | ICD-10-CM | POA: Diagnosis not present

## 2019-03-06 DIAGNOSIS — R531 Weakness: Secondary | ICD-10-CM

## 2019-03-06 DIAGNOSIS — R718 Other abnormality of red blood cells: Secondary | ICD-10-CM | POA: Diagnosis not present

## 2019-03-06 DIAGNOSIS — R5381 Other malaise: Secondary | ICD-10-CM | POA: Diagnosis not present

## 2019-03-06 DIAGNOSIS — R410 Disorientation, unspecified: Secondary | ICD-10-CM | POA: Diagnosis not present

## 2019-03-06 DIAGNOSIS — I959 Hypotension, unspecified: Secondary | ICD-10-CM | POA: Diagnosis not present

## 2019-03-06 DIAGNOSIS — R2689 Other abnormalities of gait and mobility: Secondary | ICD-10-CM | POA: Diagnosis not present

## 2019-03-06 DIAGNOSIS — A419 Sepsis, unspecified organism: Secondary | ICD-10-CM | POA: Diagnosis not present

## 2019-03-06 DIAGNOSIS — N179 Acute kidney failure, unspecified: Secondary | ICD-10-CM | POA: Diagnosis not present

## 2019-03-06 DIAGNOSIS — E440 Moderate protein-calorie malnutrition: Secondary | ICD-10-CM | POA: Diagnosis not present

## 2019-03-06 LAB — BASIC METABOLIC PANEL
Anion gap: 8 (ref 5–15)
BUN: 54 mg/dL — ABNORMAL HIGH (ref 8–23)
CO2: 22 mmol/L (ref 22–32)
Calcium: 8.1 mg/dL — ABNORMAL LOW (ref 8.9–10.3)
Chloride: 106 mmol/L (ref 98–111)
Creatinine, Ser: 1.83 mg/dL — ABNORMAL HIGH (ref 0.61–1.24)
GFR calc Af Amer: 37 mL/min — ABNORMAL LOW (ref >60)
GFR calc non Af Amer: 32 mL/min — ABNORMAL LOW (ref >60)
Glucose, Bld: 201 mg/dL — ABNORMAL HIGH (ref 70–99)
Potassium: 5.2 mmol/L — ABNORMAL HIGH (ref 3.5–5.1)
Sodium: 136 mmol/L (ref 135–145)

## 2019-03-06 LAB — CULTURE, BLOOD (ROUTINE X 2)
Culture: NO GROWTH
Special Requests: ADEQUATE

## 2019-03-06 LAB — GLUCOSE, CAPILLARY: Glucose-Capillary: 122 mg/dL — ABNORMAL HIGH (ref 70–99)

## 2019-03-06 MED ORDER — SODIUM POLYSTYRENE SULFONATE 15 GM/60ML PO SUSP
30.0000 g | Freq: Once | ORAL | Status: AC
  Administered 2019-03-06: 30 g via ORAL
  Filled 2019-03-06: qty 120

## 2019-03-06 NOTE — TOC Transition Note (Addendum)
Transition of Care [facility]) - CM/SW Discharge Note   Patient Details  Name: Craig Whitehead MRN: [id number] Date of Birth: 09-09-1927  Transition of Care [facility]) CM/SW Contact:  Wende Neighbors, LCSW Phone Number: 03/06/2019, 10:18 AM   Clinical Narrative:   Patient to discharge to [facility]. RN to please call [phone] (rm [place]) for report. Family aware of dc    Final next level of care: Skilled Nursing Facility Barriers to Discharge: No Barriers Identified   Patient Goals and CMS Choice   CMS Medicare.gov Compare Post Acute Care list provided to:: Patient Choice offered to / list presented to : Patient  Discharge Placement              Patient chooses bed at: [facility] Patient to be transferred to facility by: ptar Name of family member notified: brother Patient and family notified of of transfer: 03/06/19  Discharge Plan and Services     Post Acute Care Choice: NA                               Social Determinants of Health (SDOH) Interventions     Readmission Risk Interventions No flowsheet data found.

## 2019-03-06 NOTE — Discharge Summary (Addendum)
Physician Discharge Summary  Craig Whitehead HUT:[id number] DOB: June 01, 1928 DOA: 02/25/2019  PCP: Josetta Huddle, MD  Admit date: 02/25/2019 Discharge date: 03/06/2019  Admitted From: Home.  Disposition:  SNF  Recommendations for Outpatient Follow-up:  1. Follow up with PCP in 1-2 weeks 2. Please obtain BMP/CBC in one week Please follow up with cardiology in 2 weeks.   Discharge Condition:stable.  CODE STATUS:full code.  Diet recommendation: Heart Healthy   Brief/Interim Summary: 85 year old man with prior history of type II DM, normal pressure hydrocephalus, bladder cancer, permanent atrial fibrillation presents to ED for generalized weakness and a fall and atrial fibrillation with RVR.  Initial CT of the head and cervical spine and lumbar spine x-rays are unremarkable. He was found to have a urinary tract infection and AKI on stage III CKD. Urine cultures grew Proteus and E. coli sensitive to Keflex and he is being discharged on Keflex.  Creatinine has improved to 1.7 on discharge.  During the course of hospitalization his heart rate dropped to 40s to 50s with 2-second pause for which cardiology was consulted .  Since he was asymptomatic during the process, he was cleared for discharge by cardiology with recommendations to follow-up as outpatient.  Discharge Diagnoses:  Active Problems:   Persistent atrial fibrillation ([place]): CHA2DS2Vasc 2 (age). Rate controlled without medications asymptomatic   Weakness   Protein-calorie malnutrition, severe   AKI (acute kidney injury) ([place])   Chronic atrial fibrillation  Acute on stage III CKD with metabolic acidosis Probably secondary to dehydration and poor oral intake.  Improving with hydration and creatinine stabilized at 1.79.  Bicarb wnl, ag is closed.  Suspect this is his new baseline .   Acute encephalopathy, suspect probably metabolic Improved, probably from dehydration.  CT head negative.  Ammonia level swnl.     2. 5 sec  pause yesterday and 2 sec pause today along with bradycardia.  He was lethargic yesterday but he is more alert today and conversing.  Cardiology consulted for recommendations regarding the 2 sec pauses.   Since he was asymptomatic during the pauses cardiology cleared him for discharge from their standpoint and recommended outpatient follow-up in the office with Dr. Ellyn Hack.  Atrial fibrillation with RVR ChadsVASC SCORE is 2.  Patient is only on aspirin for anticoagulation and not on any blood thinners probably secondary to history of falls.   Sepsis secondary to urinary tract infection Urine cultures are pending and showed Proteus and E. coli IV Rocephin changed to Keflex for discharge.    Generalized weakness probably secondary to deconditioning and acute urinary tract infection and AKI PT evaluation recommending SNF,     QT prolongation on admission QTC has improved to 477. Keep mag greater than 2 and potassium greater than 4.   Hypomagnesemia Replaced.    Hyperkalemia:  Repeat potassium this morning    Macrocytic anemia Baseline appears to be around 9 Currently hemoglobin is around 7 and stable no obvious signs of bleeding Fecal occult blood is negative. Anemia panel showed low normal vitamin B12 levels, vitamin B12 supplementation added. Hemoglobin continues to remain at 7.5.    BPH Continue with Flomax   Discharge Instructions  Discharge Instructions    Diet - low sodium heart healthy   Complete by: As directed    Discharge instructions   Complete by: As directed    Please follow up with PCP in one week.   Discharge instructions   Complete by: As directed    PLEASE follow up with cardiology  in 2 weeks as recommended.   Increase activity slowly   Complete by: As directed      Allergies as of 03/06/2019   No Known Allergies     Medication List    STOP taking these medications   lidocaine 5 % Commonly known as: Lidoderm     TAKE  these medications   aspirin EC 81 MG tablet Take 1 tablet (81 mg total) by mouth daily.   cephALEXin 500 MG capsule Commonly known as: KEFLEX Take 1 capsule (500 mg total) by mouth every 12 (twelve) hours.   cholestyramine 4 g packet Commonly known as: Questran Take 1 packet (4 g total) by mouth daily.   feeding supplement (GLUCERNA SHAKE) Liqd Take 237 mLs by mouth 3 (three) times daily between meals. What changed: Another medication with the same name was added. Make sure you understand how and when to take each.   feeding supplement (ENSURE ENLIVE) Liqd Take 237 mLs by mouth daily. What changed: You were already taking a medication with the same name, and this prescription was added. Make sure you understand how and when to take each.   folic acid 1 MG tablet Commonly known as: FOLVITE Take 1 tablet (1 mg total) by mouth daily.   insulin aspart 100 UNIT/ML injection Commonly known as: novoLOG CBG 70 - 120: 0 units CBG 121 - 150: 1 unit CBG 151 - 200: 2 units CBG 201 - 250: 3 units CBG 251 - 300: 5 units CBG 301 - 350: 7 units CBG 351 - 400: 9 units What changed:   how much to take  how to take this  when to take this  additional instructions   insulin glargine 100 UNIT/ML injection Commonly known as: LANTUS Inject 0.14 mLs (14 Units total) into the skin daily. What changed: how much to take   sodium bicarbonate 650 MG tablet Take 1 tablet (650 mg total) by mouth daily.   tamsulosin 0.4 MG Caps capsule Commonly known as: FLOMAX Take 1 capsule (0.4 mg total) by mouth daily.   thiamine 100 MG tablet Take 1 tablet (100 mg total) by mouth daily.   triamcinolone 0.1 % cream : eucerin Crea Apply 1 application topically 2 (two) times daily.   vitamin B-12 500 MCG tablet Commonly known as: CYANOCOBALAMIN Take 1 tablet (500 mcg total) by mouth daily.      Contact information for after-discharge care    Destination    [facility] Preferred SNF .    Service: Skilled Nursing Contact information: [place] [phone]             No Known Allergies  Consultations:  cardiology   Procedures/Studies: Dg Chest 2 View  Result Date: 02/25/2019 CLINICAL DATA:  85 year old male status post fall at home today. EXAM: CHEST - 2 VIEW COMPARISON:  01/13/2019 and earlier. FINDINGS: Portable AP semi upright view at 1653 hours. Larger lung volumes. Tortuous descending thoracic aorta. Other mediastinal contours are within normal limits. Visualized tracheal air column is within normal limits. No pneumothorax, pulmonary edema, pleural effusion or acute pulmonary opacity. Right nipple shadow. No acute osseous abnormality identified. Negative visible bowel gas pattern. Chronic shunt catheter coursing through the right chest. IMPRESSION: 1. No acute cardiopulmonary abnormality or acute traumatic injury identified. 2. Chronically tortuous descending thoracic aorta. Electronically Signed   By: Genevie Ann M.D.   On: 02/25/2019 17:15   Ct Head Wo Contrast  Result Date: 03/04/2019 CLINICAL DATA:  [place]  from the bed. Altered level of consciousness today. EXAM: CT HEAD WITHOUT CONTRAST TECHNIQUE: Contiguous axial images were obtained from the base of the skull through the vertex without intravenous contrast. COMPARISON:  02/25/2019 FINDINGS: Brain: Generalized atrophy. Chronic small-vessel ischemic changes of the cerebral hemispheric white matter. VP shunt placed from a right frontal approach with its tip in the right lateral ventricle near the foramen of Monro. Ventricular size is stable. No sign of acute infarction, mass lesion, change in ventricular size or extra-axial collection. Vascular: There is atherosclerotic calcification of the major vessels at the base of the brain. Skull: Negative Sinuses/Orbits: Clear/normal Other: None IMPRESSION: No acute or traumatic finding. Atrophy and chronic small-vessel ischemic changes. VP  shunt appears unchanged. Ventricular size is stable. Electronically Signed   By: Nelson Chimes M.D.   On: 03/04/2019 16:31   Ct Head Wo Contrast  Result Date: 02/25/2019 CLINICAL DATA:  Ground level fall now with altered mental status EXAM: CT HEAD WITHOUT CONTRAST CT CERVICAL SPINE WITHOUT CONTRAST TECHNIQUE: Multidetector CT imaging of the head and cervical spine was performed following the standard protocol without intravenous contrast. Multiplanar CT image reconstructions of the cervical spine were also generated. COMPARISON:  Head CT January 13, 2019 FINDINGS: CT HEAD FINDINGS Brain: Redemonstration of a right frontal approach ventriculostomy shunt catheter with the tip projecting at the level of the right foramen of Monro. Positioning is unchanged from prior. The ventricular caliber is unchanged. No shunt discontinuity. Ventriculomegaly is on the background of more diffuse parenchymal atrophy. Patchy areas of white matter hypoattenuation are most compatible with chronic microvascular angiopathy. No evidence of acute infarction, hemorrhage, hydrocephalus, extra-axial collection or mass lesion/mass effect. Vascular: Atherosclerotic calcification of the carotid siphons. No hyperdense vessel or unexpected calcification. Skull: Right frontal scalp reservoir. Tubing courses along the right scalp to the soft tissues of the right neck. No calvarial fracture or suspicious osseous lesion. No scalp swelling or hematoma. Sinuses/Orbits: Paranasal sinuses and mastoid air cells are predominantly clear. Orbital structures are unremarkable aside from prior lens extractions. Other: None CT CERVICAL SPINE FINDINGS Alignment: Slight straightening of the normal cervical lordosis on a degenerative basis. Skull base and vertebrae: No acute fracture or traumatic listhesis. No primary bone lesion or focal pathologic process. Soft tissues and spinal canal: No pre or paravertebral fluid or swelling. No visible canal hematoma. Disc  levels: Multilevel intervertebral disc height loss with diffuse cervical spondylitic changes maximally from C5 through the included levels of the thoracic spine. Uncinate spurring and facet hypertrophic changes result in mild moderate multilevel foraminal stenoses but no severe spinal canal or foraminal stenosis is seen at any level in the imaged cervical spine. Upper chest: Apical is pleuroparenchymal scarring. No acute abnormality in the upper chest or imaged lung apices. Other: Vascular calcium in the right carotid bifurcation IMPRESSION: 1. No acute intracranial abnormality. Chronic senescent changes similar to prior. 2. Stable positioning of a right frontal approach ventriculostomy shunt catheter and stable ventricular caliber. 3. No acute cervical spine fracture. 4. Multilevel degenerative cervical spondylosis without severe canal narrowing or foraminal stenosis. Electronically Signed   By: Lovena Le M.D.   On: 02/25/2019 17:05   Ct Cervical Spine Wo Contrast  Result Date: 02/25/2019 CLINICAL DATA:  Ground level fall now with altered mental status EXAM: CT HEAD WITHOUT CONTRAST CT CERVICAL SPINE WITHOUT CONTRAST TECHNIQUE: Multidetector CT imaging of the head and cervical spine was performed following the standard protocol without intravenous contrast. Multiplanar CT image reconstructions of the cervical spine  were also generated. COMPARISON:  Head CT January 13, 2019 FINDINGS: CT HEAD FINDINGS Brain: Redemonstration of a right frontal approach ventriculostomy shunt catheter with the tip projecting at the level of the right foramen of Monro. Positioning is unchanged from prior. The ventricular caliber is unchanged. No shunt discontinuity. Ventriculomegaly is on the background of more diffuse parenchymal atrophy. Patchy areas of white matter hypoattenuation are most compatible with chronic microvascular angiopathy. No evidence of acute infarction, hemorrhage, hydrocephalus, extra-axial collection or mass  lesion/mass effect. Vascular: Atherosclerotic calcification of the carotid siphons. No hyperdense vessel or unexpected calcification. Skull: Right frontal scalp reservoir. Tubing courses along the right scalp to the soft tissues of the right neck. No calvarial fracture or suspicious osseous lesion. No scalp swelling or hematoma. Sinuses/Orbits: Paranasal sinuses and mastoid air cells are predominantly clear. Orbital structures are unremarkable aside from prior lens extractions. Other: None CT CERVICAL SPINE FINDINGS Alignment: Slight straightening of the normal cervical lordosis on a degenerative basis. Skull base and vertebrae: No acute fracture or traumatic listhesis. No primary bone lesion or focal pathologic process. Soft tissues and spinal canal: No pre or paravertebral fluid or swelling. No visible canal hematoma. Disc levels: Multilevel intervertebral disc height loss with diffuse cervical spondylitic changes maximally from C5 through the included levels of the thoracic spine. Uncinate spurring and facet hypertrophic changes result in mild moderate multilevel foraminal stenoses but no severe spinal canal or foraminal stenosis is seen at any level in the imaged cervical spine. Upper chest: Apical is pleuroparenchymal scarring. No acute abnormality in the upper chest or imaged lung apices. Other: Vascular calcium in the right carotid bifurcation IMPRESSION: 1. No acute intracranial abnormality. Chronic senescent changes similar to prior. 2. Stable positioning of a right frontal approach ventriculostomy shunt catheter and stable ventricular caliber. 3. No acute cervical spine fracture. 4. Multilevel degenerative cervical spondylosis without severe canal narrowing or foraminal stenosis. Electronically Signed   By: Lovena Le M.D.   On: 02/25/2019 17:05   Ct Lumbar Spine Wo Contrast  Result Date: 02/25/2019 CLINICAL DATA:  Tenderness to palpation over L-spine after ground level fall EXAM: CT LUMBAR SPINE  WITHOUT CONTRAST TECHNIQUE: Multidetector CT imaging of the lumbar spine was performed without intravenous contrast administration. Multiplanar CT image reconstructions were also generated. COMPARISON:  Lumbar radiographs 07/20/2018 FINDINGS: Segmentation: 5 lumbar type vertebrae. Alignment: Preservation of the normal lumbar lordosis without traumatic listhesis. No abnormal facet widening. Vertebrae: The bones are diffusely demineralized which may limit detection of subtle compression deformity and nondisplaced fractures. No definite fracture or vertebral body height loss is evident. There is degenerative fusion of the L1-2 vertebral bodies. Large Schmorl's node formations are present in the endplates along the O2-4 disc space. Osteophyte formation is noted diffusely in the lumbar spine as are hypertrophic degenerative facet changes. Paraspinal and other soft tissues: No pre or paravertebral fluid or swelling. No visible canal hematoma. Disc levels: Multilevel intervertebral disc height loss with facet hypertrophic changes results in multilevel mild-to-moderate foraminal stenoses but no severe neural foraminal narrowing or significant spinal canal stenosis. Other: There is a small transcortical lucency through the left sacral ala. Could reflect a nondisplaced fracture/insufficiency fracture. Aortic atherosclerosis. Shunt catheter tubing in the low pelvis. Vascular calcifications in the renal hila. IMPRESSION: 1. The bones are diffusely demineralized which may limit detection of subtle compression deformity and nondisplaced fractures. No definite fracture or vertebral body height loss is evident. If there is persistent clinical concern for an acute fracture, consider MRI for further  evaluation. 2. Question left sacral insufficiency fracture versus prominent vascular channel 3. Multilevel degenerative changes throughout the lumbar spine as described above. 4.  Aortic Atherosclerosis (ICD10-I70.0). Electronically  Signed   By: Lovena Le M.D.   On: 02/25/2019 17:13   Dg Chest Port 1 View  Result Date: 03/04/2019 CLINICAL DATA:  Generalized weakness. Fall. Tachypnea. EXAM: PORTABLE CHEST 1 VIEW COMPARISON:  Radiograph 02/25/2019 FINDINGS: Unchanged heart size and mediastinal contours. Tortuous thoracic aorta as before. No pulmonary edema, focal airspace disease, large pleural effusion or pneumothorax. Chronic reticular left lung base opacities, likely scarring. Shunt catheter tubing courses over the right chest. The bones are under mineralized. Skin folds project over the left chest wall. IMPRESSION: No acute chest finding. Electronically Signed   By: Keith Rake M.D.   On: 03/04/2019 13:39       Subjective: No new complaints at thist ime.    Vitals:   03/05/19 1314 03/05/19 2122 03/06/19 0500 03/06/19 0513  BP: 97/67 113/62  115/69  Pulse: 76 97  66  Resp: 18 16  20   Temp: 98.2 F (36.8 C) 98.1 F (36.7 C)  98.4 F (36.9 C)  TempSrc: Oral Oral  Oral  SpO2: 98% 99%  96%  Weight:   56.9 kg   Height:        General: Pt is alert, awake, not in acute distress Cardiovascular: RRR, S1/S2 +, no rubs, no gallops Respiratory: CTA bilaterally, no wheezing, no rhonchi Abdominal: Soft, NT, ND, bowel sounds + Extremities: no edema, no cyanosis    The results of significant diagnostics from this hospitalization (including imaging, microbiology, ancillary and laboratory) are listed below for reference.     Microbiology: Recent Results (from the past 240 hour(s))  Culture, Urine     Status: Abnormal   Collection Time: 02/26/19  5:04 AM   Specimen: Urine, Clean Catch  Result Value Ref Range Status   Specimen Description   Final    URINE, CLEAN CATCH Performed at [facility], [place] [address] [place], [place] [address]    Special Requests   Final    Normal Performed at [facility], [place] [address] [place], [place] [address]    Culture (A)  Final     <10,000 COLONIES/mL INSIGNIFICANT GROWTH Performed at [place] [address], [place], [place] [address]    Report Status 02/27/2019 FINAL  Final  MRSA PCR Screening     Status: None   Collection Time: 02/26/19  8:45 PM   Specimen: Nasopharyngeal  Result Value Ref Range Status   MRSA by PCR NEGATIVE NEGATIVE Final    Comment:        The GeneXpert MRSA Assay (FDA approved for NASAL specimens only), is one component of a comprehensive MRSA colonization surveillance program. It is not intended to diagnose MRSA infection nor to guide or monitor treatment for MRSA infections. Performed at [facility], [place] [address], [place], [place] [address]   SARS Coronavirus 2 [facility] order, Performed in [facility] hospital lab)     Status: None   Collection Time: 02/26/19 11:28 PM  Result Value Ref Range Status   SARS Coronavirus 2 NEGATIVE NEGATIVE Final    Comment: (NOTE) If result is NEGATIVE SARS-CoV-2 target nucleic acids are NOT DETECTED. The SARS-CoV-2 RNA is generally detectable in upper and lower  respiratory specimens during the acute phase of infection. The lowest  concentration of SARS-CoV-2 viral copies this assay can detect is  250  copies / mL. A negative result does not preclude SARS-CoV-2 infection  and should not be used as the sole basis for treatment or other  patient management decisions.  A negative result may occur with  improper specimen collection / handling, submission of specimen other  than nasopharyngeal swab, presence of viral mutation(s) within the  areas targeted by this assay, and inadequate number of viral copies  (<250 copies / mL). A negative result must be combined with clinical  observations, patient history, and epidemiological information. If result is POSITIVE SARS-CoV-2 target nucleic acids are DETECTED. The SARS-CoV-2 RNA is generally detectable in upper and lower  respiratory specimens dur ing the acute phase  of infection.  Positive  results are indicative of active infection with SARS-CoV-2.  Clinical  correlation with patient history and other diagnostic information is  necessary to determine patient infection status.  Positive results do  not rule out bacterial infection or co-infection with other viruses. If result is PRESUMPTIVE POSTIVE SARS-CoV-2 nucleic acids MAY BE PRESENT.   A presumptive positive result was obtained on the submitted specimen  and confirmed on repeat testing.  While 2019 novel coronavirus  (SARS-CoV-2) nucleic acids may be present in the submitted sample  additional confirmatory testing may be necessary for epidemiological  and / or clinical management purposes  to differentiate between  SARS-CoV-2 and other Sarbecovirus currently known to infect humans.  If clinically indicated additional testing with an alternate test  methodology [phone]) is advised. The SARS-CoV-2 RNA is generally  detectable in upper and lower respiratory sp ecimens during the acute  phase of infection. The expected result is Negative. Fact Sheet for Patients:  StrictlyIdeas.no Fact Sheet for Healthcare Providers: BankingDealers.co.za This test is not yet approved or cleared by the [place] FDA and has been authorized for detection and/or diagnosis of SARS-CoV-2 by FDA under an Emergency Use Authorization (EUA).  This EUA will remain in effect (meaning this test can be used) for the duration of the COVID-19 declaration under Section 564(b)(1) of the Act, 21 U.S.C. section 360bbb-3(b)(1), unless the authorization is terminated or revoked sooner. Performed at [facility], [place] [address], [place], [place] [address]   Culture, blood (routine x 2)     Status: Abnormal   Collection Time: 02/28/19  8:05 PM   Specimen: BLOOD LEFT FOREARM  Result Value Ref Range Status   Specimen Description   Final    BLOOD LEFT  FOREARM Performed at [facility], [place] [address] [place], [place] [address]    Special Requests   Final    BOTTLES DRAWN AEROBIC ONLY Blood Culture adequate volume Performed at [place] [address] [place], [place] [address]    Culture  Setup Time   Final    AEROBIC BOTTLE ONLY GRAM POSITIVE COCCI CRITICAL RESULT CALLED TO, READ BACK BY AND VERIFIED WITH: Shelda Jakes [facility] 03/01/19 2352 JDW    Culture (A)  Final    STAPHYLOCOCCUS SPECIES (COAGULASE NEGATIVE) THE SIGNIFICANCE OF ISOLATING THIS ORGANISM FROM A SINGLE SET OF BLOOD CULTURES WHEN MULTIPLE SETS ARE DRAWN IS UNCERTAIN. PLEASE NOTIFY THE MICROBIOLOGY DEPARTMENT WITHIN ONE WEEK IF SPECIATION AND SENSITIVITIES ARE REQUIRED. Performed at [facility], [place] [address], [place],  [address]    Report Status 03/03/2019 FINAL  Final  Culture, blood (routine x 2)     Status: None (Preliminary result)   Collection Time: 02/28/19  8:05 PM   Specimen: BLOOD  Result Value Ref Range Status   Specimen Description   Final    BLOOD LEFT ANTECUBITAL Performed at [place] [address] [place], [place] [address]    Special Requests   Final    BOTTLES DRAWN AEROBIC ONLY Blood Culture adequate volume Performed at [place] [address], [place], [place] [address]    Culture   Final    NO GROWTH 4 DAYS Performed at [facility], [place] [address] [place], [place] [address]    Report Status PENDING  Incomplete  Blood Culture ID Panel (Reflexed)     Status: Abnormal   Collection Time: 02/28/19  8:05 PM  Result Value Ref Range Status   Enterococcus species NOT DETECTED NOT DETECTED Final   Listeria monocytogenes NOT DETECTED NOT DETECTED Final   Staphylococcus species DETECTED (A) NOT DETECTED Final    Comment: Methicillin (oxacillin) susceptible coagulase negative staphylococcus. Possible blood culture contaminant (unless isolated from more  than one blood culture draw or clinical case suggests pathogenicity). No antibiotic treatment is indicated for blood  culture contaminants. CRITICAL RESULT CALLED TO, READ BACK BY AND VERIFIED WITH: Shelda Jakes [facility] 03/01/19 2352 JDW    Staphylococcus aureus (BCID) NOT DETECTED NOT DETECTED Final   Methicillin resistance NOT DETECTED NOT DETECTED Final   Streptococcus species NOT DETECTED NOT DETECTED Final   Streptococcus agalactiae NOT DETECTED NOT DETECTED Final   Streptococcus pneumoniae NOT DETECTED NOT DETECTED Final   Streptococcus pyogenes NOT DETECTED NOT DETECTED Final   Acinetobacter baumannii NOT DETECTED NOT DETECTED Final   Enterobacteriaceae species NOT DETECTED NOT DETECTED Final   Enterobacter cloacae complex NOT DETECTED NOT DETECTED Final   Escherichia coli NOT DETECTED NOT DETECTED Final   Klebsiella oxytoca NOT DETECTED NOT DETECTED Final   Klebsiella pneumoniae NOT DETECTED NOT DETECTED Final   Proteus species NOT DETECTED NOT DETECTED Final   Serratia marcescens NOT DETECTED NOT DETECTED Final   Haemophilus influenzae NOT DETECTED NOT DETECTED Final   Neisseria meningitidis NOT DETECTED NOT DETECTED Final   Pseudomonas aeruginosa NOT DETECTED NOT DETECTED Final   Candida albicans NOT DETECTED NOT DETECTED Final   Candida glabrata NOT DETECTED NOT DETECTED Final   Candida krusei NOT DETECTED NOT DETECTED Final   Candida parapsilosis NOT DETECTED NOT DETECTED Final   Candida tropicalis NOT DETECTED NOT DETECTED Final    Comment: Performed at [facility], [address] [place], [place] [address]  Culture, Urine     Status: Abnormal   Collection Time: 03/01/19  9:01 AM   Specimen: Urine, Clean Catch  Result Value Ref Range Status   Specimen Description   Final    URINE, CLEAN CATCH Performed at [facility], [place] [address], [place], [place] [address]    Special Requests   Final    NONE Performed at [facility], [place] [address], [place], [place] [address]    Culture (A)  Final    >=100,000 COLONIES/mL ESCHERICHIA COLI >=100,000 COLONIES/mL PROTEUS MIRABILIS    Report Status 03/04/2019 FINAL  Final   Organism ID, Bacteria ESCHERICHIA COLI (A)  Final   Organism ID, Bacteria PROTEUS MIRABILIS (A)  Final      Susceptibility   Escherichia coli - MIC*    AMPICILLIN 8 SENSITIVE Sensitive     CEFAZOLIN <=4 SENSITIVE Sensitive     CEFTRIAXONE <=1 SENSITIVE Sensitive     CIPROFLOXACIN <=0.25 SENSITIVE Sensitive     GENTAMICIN <=  1 SENSITIVE Sensitive     IMIPENEM <=0.25 SENSITIVE Sensitive     NITROFURANTOIN <=16 SENSITIVE Sensitive     TRIMETH/SULFA <=20 SENSITIVE Sensitive     AMPICILLIN/SULBACTAM 4 SENSITIVE Sensitive     PIP/TAZO <=4 SENSITIVE Sensitive     Extended ESBL NEGATIVE Sensitive     * >=100,000 COLONIES/mL ESCHERICHIA COLI   Proteus mirabilis - MIC*    AMPICILLIN <=2 SENSITIVE Sensitive     CEFAZOLIN <=4 SENSITIVE Sensitive     CEFTRIAXONE <=1 SENSITIVE Sensitive     CIPROFLOXACIN <=0.25 SENSITIVE Sensitive     GENTAMICIN <=1 SENSITIVE Sensitive     IMIPENEM 2 SENSITIVE Sensitive     NITROFURANTOIN 128 RESISTANT Resistant     TRIMETH/SULFA <=20 SENSITIVE Sensitive     AMPICILLIN/SULBACTAM <=2 SENSITIVE Sensitive     PIP/TAZO <=4 SENSITIVE Sensitive     * >=100,000 COLONIES/mL PROTEUS MIRABILIS     Labs: BNP (last 3 results) Recent Labs    02/25/19 1605  BNP 591.6*   Basic Metabolic Panel: Recent Labs  Lab 03/01/19 0540 03/02/19 0417 03/03/19 0456 03/04/19 0448 03/05/19 0956 03/05/19 1826  NA 134* 133* 137 138 136  --   K 5.0 5.3* 4.9 4.6 5.2* 5.4*  CL 106 104 108 110 106  --   CO2 21* 21* 20* 17* 22  --   GLUCOSE 166* 139* 167* 194* 200*  --   BUN 62* 64* 59* 58* 57*  --   CREATININE 2.23* 2.13* 2.02* 1.79* 1.79*  --   CALCIUM 7.2* 7.1* 7.3* 7.6* 8.1*  --   MG  --   --   --  1.2* 2.1  --    Liver Function Tests: Recent Labs  Lab 03/03/19 0456  AST 21  ALT 30   ALKPHOS 56  BILITOT 0.3  PROT 5.2*  ALBUMIN 2.6*   No results for input(s): LIPASE, AMYLASE in the last 168 hours. Recent Labs  Lab 03/04/19 1654  AMMONIA 24   CBC: Recent Labs  Lab 03/01/19 0540 03/02/19 0417 03/03/19 0456 03/04/19 0448 03/05/19 0956  WBC 16.6* 7.7 5.1 5.1 5.2  NEUTROABS  --   --   --   --  2.9  HGB 7.2* 7.0* 7.2* 7.2* 7.5*  HCT 21.9* 22.1* 23.2* 23.1* 23.7*  MCV 107.9* 109.4* 111.5* 109.5* 109.7*  PLT 110* 124* 148* 176 190   Cardiac Enzymes: No results for input(s): CKTOTAL, CKMB, CKMBINDEX, TROPONINI in the last 168 hours. BNP: Invalid input(s): POCBNP CBG: Recent Labs  Lab 03/05/19 0834 03/05/19 1134 03/05/19 1636 03/05/19 2200 03/06/19 0835  GLUCAP 113* 203* 136* 127* 122*   D-Dimer No results for input(s): DDIMER in the last 72 hours. Hgb A1c No results for input(s): HGBA1C in the last 72 hours. Lipid Profile No results for input(s): CHOL, HDL, LDLCALC, TRIG, CHOLHDL, LDLDIRECT in the last 72 hours. Thyroid function studies No results for input(s): TSH, T4TOTAL, T3FREE, THYROIDAB in the last 72 hours.  Invalid input(s): FREET3 Anemia work up Recent Labs    03/04/19 0448  VITAMINB12 288  FOLATE 12.3  FERRITIN 275  TIBC 176*  IRON 67  RETICCTPCT 1.2   Urinalysis    Component Value Date/Time   COLORURINE YELLOW 02/28/2019 1900   APPEARANCEUR HAZY (A) 02/28/2019 1900   LABSPEC 1.009 02/28/2019 1900   PHURINE 7.0 02/28/2019 1900   GLUCOSEU NEGATIVE 02/28/2019 1900   HGBUR SMALL (A) 02/28/2019 1900   BILIRUBINUR NEGATIVE 02/28/2019 1900   KETONESUR NEGATIVE 02/28/2019 1900  PROTEINUR NEGATIVE 02/28/2019 1900   NITRITE POSITIVE (A) 02/28/2019 1900   LEUKOCYTESUR LARGE (A) 02/28/2019 1900   Sepsis Labs Invalid input(s): PROCALCITONIN,  WBC,  LACTICIDVEN Microbiology Recent Results (from the past 240 hour(s))  Culture, Urine     Status: Abnormal   Collection Time: 02/26/19  5:04 AM   Specimen: Urine, Clean Catch   Result Value Ref Range Status   Specimen Description   Final    URINE, CLEAN CATCH Performed at [facility], [place] [address], [place], [place] [address]    Special Requests   Final    Normal Performed at [facility], [place] [address] [place], [place] [address]    Culture (A)  Final    <10,000 COLONIES/mL INSIGNIFICANT GROWTH Performed at [place] [address], [place], [place] [address]    Report Status 02/27/2019 FINAL  Final  MRSA PCR Screening     Status: None   Collection Time: 02/26/19  8:45 PM   Specimen: Nasopharyngeal  Result Value Ref Range Status   MRSA by PCR NEGATIVE NEGATIVE Final    Comment:        The GeneXpert MRSA Assay (FDA approved for NASAL specimens only), is one component of a comprehensive MRSA colonization surveillance program. It is not intended to diagnose MRSA infection nor to guide or monitor treatment for MRSA infections. Performed at [facility], [place] [address], [place], [place] [address]   SARS Coronavirus 2 [facility] order, Performed in [facility] hospital lab)     Status: None   Collection Time: 02/26/19 11:28 PM  Result Value Ref Range Status   SARS Coronavirus 2 NEGATIVE NEGATIVE Final    Comment: (NOTE) If result is NEGATIVE SARS-CoV-2 target nucleic acids are NOT DETECTED. The SARS-CoV-2 RNA is generally detectable in upper and lower  respiratory specimens during the acute phase of infection. The lowest  concentration of SARS-CoV-2 viral copies this assay can detect is 250  copies / mL. A negative result does not preclude SARS-CoV-2 infection  and should not be used as the sole basis for treatment or other  patient management decisions.  A negative result may occur with  improper specimen collection / handling, submission of specimen other  than nasopharyngeal swab, presence of viral mutation(s) within the  areas targeted by this assay, and inadequate number of  viral copies  (<250 copies / mL). A negative result must be combined with clinical  observations, patient history, and epidemiological information. If result is POSITIVE SARS-CoV-2 target nucleic acids are DETECTED. The SARS-CoV-2 RNA is generally detectable in upper and lower  respiratory specimens dur ing the acute phase of infection.  Positive  results are indicative of active infection with SARS-CoV-2.  Clinical  correlation with patient history and other diagnostic information is  necessary to determine patient infection status.  Positive results do  not rule out bacterial infection or co-infection with other viruses. If result is PRESUMPTIVE POSTIVE SARS-CoV-2 nucleic acids MAY BE PRESENT.   A presumptive positive result was obtained on the submitted specimen  and confirmed on repeat testing.  While 2019 novel coronavirus  (SARS-CoV-2) nucleic acids may be present in the submitted sample  additional confirmatory testing may be necessary for epidemiological  and / or clinical management purposes  to differentiate between  SARS-CoV-2 and other Sarbecovirus currently known to infect humans.  If clinically indicated additional testing with an alternate test  methodology [phone]) is advised. The SARS-CoV-2 RNA is generally  detectable in upper and lower respiratory sp ecimens during the acute  phase of infection. The expected result is Negative. Fact Sheet for Patients:  StrictlyIdeas.no Fact Sheet for Healthcare Providers: BankingDealers.co.za This test is not yet approved or cleared by the [place] FDA and has been authorized for detection and/or diagnosis of SARS-CoV-2 by FDA under an Emergency Use Authorization (EUA).  This EUA will remain in effect (meaning this test can be used) for the duration of the COVID-19 declaration under Section 564(b)(1) of the Act, 21 U.S.C. section 360bbb-3(b)(1), unless the authorization is  terminated or revoked sooner. Performed at [facility], [place] [address], [place], [place] [address]   Culture, blood (routine x 2)     Status: Abnormal   Collection Time: 02/28/19  8:05 PM   Specimen: BLOOD LEFT FOREARM  Result Value Ref Range Status   Specimen Description   Final    BLOOD LEFT FOREARM Performed at [facility], [place] [address] [place], [place] [address]    Special Requests   Final    BOTTLES DRAWN AEROBIC ONLY Blood Culture adequate volume Performed at [place] [address], [place], [place] [address]    Culture  Setup Time   Final    AEROBIC BOTTLE ONLY GRAM POSITIVE COCCI CRITICAL RESULT CALLED TO, READ BACK BY AND VERIFIED WITH: Shelda Jakes [facility] 03/01/19 2352 JDW    Culture (A)  Final    STAPHYLOCOCCUS SPECIES (COAGULASE NEGATIVE) THE SIGNIFICANCE OF ISOLATING THIS ORGANISM FROM A SINGLE SET OF BLOOD CULTURES WHEN MULTIPLE SETS ARE DRAWN IS UNCERTAIN. PLEASE NOTIFY THE MICROBIOLOGY DEPARTMENT WITHIN ONE WEEK IF SPECIATION AND SENSITIVITIES ARE REQUIRED. Performed at [facility], [place] [address] [place], [place] [address]    Report Status 03/03/2019 FINAL  Final  Culture, blood (routine x 2)     Status: None (Preliminary result)   Collection Time: 02/28/19  8:05 PM   Specimen: BLOOD  Result Value Ref Range Status   Specimen Description   Final    BLOOD LEFT ANTECUBITAL Performed at [place] [address], [place], [place] [address]    Special Requests   Final    BOTTLES DRAWN AEROBIC ONLY Blood Culture adequate volume Performed at [place] [address], [place], [place] [address]    Culture   Final    NO GROWTH 4 DAYS Performed at [facility], [place] [address], [place],  [address]    Report Status PENDING  Incomplete  Blood Culture ID Panel (Reflexed)     Status: Abnormal   Collection Time: 02/28/19  8:05 PM  Result Value Ref Range  Status   Enterococcus species NOT DETECTED NOT DETECTED Final   Listeria monocytogenes NOT DETECTED NOT DETECTED Final   Staphylococcus species DETECTED (A) NOT DETECTED Final    Comment: Methicillin (oxacillin) susceptible coagulase negative staphylococcus. Possible blood culture contaminant (unless isolated from more than one blood culture draw or clinical case suggests pathogenicity). No antibiotic treatment is indicated for blood  culture contaminants. CRITICAL RESULT CALLED TO, READ BACK BY AND VERIFIED WITH: Shelda Jakes [facility] 03/01/19 2352 JDW    Staphylococcus aureus (BCID) NOT DETECTED NOT DETECTED Final   Methicillin resistance NOT DETECTED NOT DETECTED Final   Streptococcus species NOT DETECTED NOT DETECTED Final   Streptococcus agalactiae NOT DETECTED NOT DETECTED Final   Streptococcus pneumoniae NOT DETECTED NOT DETECTED Final   Streptococcus pyogenes NOT DETECTED NOT DETECTED Final  Acinetobacter baumannii NOT DETECTED NOT DETECTED Final   Enterobacteriaceae species NOT DETECTED NOT DETECTED Final   Enterobacter cloacae complex NOT DETECTED NOT DETECTED Final   Escherichia coli NOT DETECTED NOT DETECTED Final   Klebsiella oxytoca NOT DETECTED NOT DETECTED Final   Klebsiella pneumoniae NOT DETECTED NOT DETECTED Final   Proteus species NOT DETECTED NOT DETECTED Final   Serratia marcescens NOT DETECTED NOT DETECTED Final   Haemophilus influenzae NOT DETECTED NOT DETECTED Final   Neisseria meningitidis NOT DETECTED NOT DETECTED Final   Pseudomonas aeruginosa NOT DETECTED NOT DETECTED Final   Candida albicans NOT DETECTED NOT DETECTED Final   Candida glabrata NOT DETECTED NOT DETECTED Final   Candida krusei NOT DETECTED NOT DETECTED Final   Candida parapsilosis NOT DETECTED NOT DETECTED Final   Candida tropicalis NOT DETECTED NOT DETECTED Final    Comment: Performed at [facility], [place] [address] [place], [place] [address]  Culture, Urine     Status: Abnormal    Collection Time: 03/01/19  9:01 AM   Specimen: Urine, Clean Catch  Result Value Ref Range Status   Specimen Description   Final    URINE, CLEAN CATCH Performed at [facility], [place] [address], [place], [place] [address]    Special Requests   Final    NONE Performed at [facility], [place] [address], [place],  [address]    Culture (A)  Final    >=100,000 COLONIES/mL ESCHERICHIA COLI >=100,000 COLONIES/mL PROTEUS MIRABILIS    Report Status 03/04/2019 FINAL  Final   Organism ID, Bacteria ESCHERICHIA COLI (A)  Final   Organism ID, Bacteria PROTEUS MIRABILIS (A)  Final      Susceptibility   Escherichia coli - MIC*    AMPICILLIN 8 SENSITIVE Sensitive     CEFAZOLIN <=4 SENSITIVE Sensitive     CEFTRIAXONE <=1 SENSITIVE Sensitive     CIPROFLOXACIN <=0.25 SENSITIVE Sensitive     GENTAMICIN <=1 SENSITIVE Sensitive     IMIPENEM <=0.25 SENSITIVE Sensitive     NITROFURANTOIN <=16 SENSITIVE Sensitive     TRIMETH/SULFA <=20 SENSITIVE Sensitive     AMPICILLIN/SULBACTAM 4 SENSITIVE Sensitive     PIP/TAZO <=4 SENSITIVE Sensitive     Extended ESBL NEGATIVE Sensitive     * >=100,000 COLONIES/mL ESCHERICHIA COLI   Proteus mirabilis - MIC*    AMPICILLIN <=2 SENSITIVE Sensitive     CEFAZOLIN <=4 SENSITIVE Sensitive     CEFTRIAXONE <=1 SENSITIVE Sensitive     CIPROFLOXACIN <=0.25 SENSITIVE Sensitive     GENTAMICIN <=1 SENSITIVE Sensitive     IMIPENEM 2 SENSITIVE Sensitive     NITROFURANTOIN 128 RESISTANT Resistant     TRIMETH/SULFA <=20 SENSITIVE Sensitive     AMPICILLIN/SULBACTAM <=2 SENSITIVE Sensitive     PIP/TAZO <=4 SENSITIVE Sensitive     * >=100,000 COLONIES/mL PROTEUS MIRABILIS     Time coordinating discharge: 31  minutes  SIGNED:   Hosie Poisson, MD  [facility] 03/06/2019, 9:59 AM Pager   If 7PM-7AM, please contact night-coverage [email or web address] Password [email or web address]

## 2019-03-06 NOTE — Progress Notes (Signed)
Patient discharging to [facility].  Report called to Friend at facility.  AVS placed in packet.  IV removed - WNL.  SW notified family.  Patient in NAD at this time, awaiting PTAR for transport.

## 2019-03-06 NOTE — Progress Notes (Signed)
Occupational Therapy Treatment Patient Details Name: Craig Whitehead MRN: [id number] DOB: June 19, 1928 Today's Date: 03/06/2019    History of present illness 85 year old man who was admitted after a fall from bed and found to have acute kidney infection.  PMH:  permanent AFIb, DM, normal pressure hydrocephalus, bladder CA, CHF.  He reports that legs give out   OT comments  Pt tolerated session well with no complaints of pain or dizziness. BP levels listed below, with no significance in activity tolerance. Session involved BSC transfer from bed with Min A +2, 2 hand held assist. Pt observed to ambulate slowly, likely fearful. Pt will benefit from continued acute OT to address activity tolerance, safe engagement in ADLs, and strengthening. DC and freq remains the same. OT will continue to follow acutely.    Follow Up Recommendations  SNF    Equipment Recommendations  3 in 1 bedside commode    Recommendations for Other Services      Precautions / Restrictions Precautions Precautions: Fall Restrictions Weight Bearing Restrictions: No       Mobility Bed Mobility Overal bed mobility: Needs Assistance Bed Mobility: Supine to Sit;Sit to Supine     Supine to sit: Mod assist     General bed mobility comments: Required increased physical assistance this session to manage LE and elevate trunk to sit at EOB.   Transfers Overall transfer level: Needs assistance Equipment used: 2 person hand held assist Transfers: Sit to/from Stand Sit to Stand: Min assist;+2 safety/equipment;Mod assist;+2 physical assistance Stand pivot transfers: +2 physical assistance;+2 safety/equipment;Mod assist;Max assist       General transfer comment: Pt observed to studder step when ambulating to chair and BSC.    Balance Overall balance assessment: Needs assistance Sitting-balance support: No upper extremity supported;Feet supported Sitting balance-Leahy Scale: Fair Sitting balance - Comments: tended  to lose balance posterior   Standing balance support: Bilateral upper extremity supported Standing balance-Leahy Scale: Poor                             ADL either performed or assessed with clinical judgement   ADL Overall ADL's : Needs assistance/impaired Eating/Feeding: Independent                       Toilet Transfer: Minimal assistance;+2 for physical assistance;+2 for safety/equipment(and chair) Toilet Transfer Details (indicate cue type and reason): for safety with 2 hand held assist.           General ADL Comments: Prior toilet transfer BP values supine: 105/58, EOB: 98/67 and 100/65     Vision       Perception     Praxis      Cognition Arousal/Alertness: Awake/alert Behavior During Therapy: WFL for tasks assessed/performed Overall Cognitive Status: Within Functional Limits for tasks assessed                                 General Comments: AxOx4  pt stated he fells "wore out" otherwise cooperative        Exercises     Shoulder Instructions       General Comments      Pertinent Vitals/ Pain       Pain Assessment: No/denies pain Pain Location: general Pain Descriptors / Indicators: Sore Pain Intervention(s): Monitored during session;Repositioned  Home Living  Prior Functioning/Environment              Frequency  Min 2X/week        Progress Toward Goals  OT Goals(current goals can now be found in the care plan section)  Progress towards OT goals: Progressing toward goals  Acute Rehab OT Goals Patient Stated Goal: none stated; agreeable to OOB OT Goal Formulation: With patient Time For Goal Achievement: 03/12/19 Potential to Achieve Goals: Good ADL Goals Pt Will Transfer to Toilet: with min guard assist;stand pivot transfer;bedside commode(and complete hygiene with min A) Additional ADL Goal #1: pt will complete UB adls with set  up/supervision from sitting Additional ADL Goal #2: pt will complete LB adls with min A, sit to stand Additional ADL Goal #3: pt will will perform bed mobility with min guard in preparation for adls  Plan Discharge plan remains appropriate;Frequency remains appropriate    Co-evaluation                 AM-PAC OT "6 Clicks" Daily Activity     Outcome Measure   Help from another person eating meals?: None Help from another person taking care of personal grooming?: A Little Help from another person toileting, which includes using toliet, bedpan, or urinal?: Total Help from another person bathing (including washing, rinsing, drying)?: A Lot Help from another person to put on and taking off regular upper body clothing?: A Little Help from another person to put on and taking off regular lower body clothing?: Total 6 Click Score: 14    End of Session Equipment Utilized During Treatment: Gait belt  OT Visit Diagnosis: Unsteadiness on feet (R26.81);Muscle weakness (generalized) (M62.81);History of falling (Z91.81)   Activity Tolerance Patient tolerated treatment well   Patient Left with call bell/phone within reach;in chair;with chair alarm set;with nursing/sitter in room   Nurse Communication Mobility status        Time: 3559-7416 OT Time Calculation (min): 20 min  Charges: OT General Charges $OT Visit: 1 Visit OT Treatments $Self Care/Home Management : 23-37 mins  Minus Breeding, MSOT, OTR/L  Supplemental Rehabilitation Services  [phone]    Marius Ditch 03/06/2019, 9:55 AM

## 2019-03-06 NOTE — Discharge Instructions (Signed)
Atrial Fibrillation Atrial fibrillation is a type of irregular or rapid heartbeat (arrhythmia). In atrial fibrillation, the top part of the heart (atria) quivers in a chaotic pattern. This makes the heart unable to pump blood normally. Having atrial fibrillation can increase your risk for other health problems, such as:  Blood can pool in the atria and form clots. If a clot travels to the brain, it can cause a stroke.  The heart muscle may weaken from the irregular blood flow. This can cause heart failure. Atrial fibrillation may start suddenly and stop on its own, or it may become a long-lasting problem. What are the causes? This condition is caused by some heart-related conditions or procedures, including:  High blood pressure. This is the most common cause.  Heart failure.  Heart valve conditions.  Inflammation of the sac that surrounds the heart (pericarditis).  Heart surgery.  Coronary artery disease.  Certain heart rhythm disorders, such as Wolf-Parkinson-White syndrome. Other causes include:  Pneumonia.  Obstructive sleep apnea.  Lung cancer.  Thyroid problems, especially if the thyroid is overactive (hyperthyroidism).  Excessive alcohol or drug use. Sometimes, the cause of this condition is not known. What increases the risk? This condition is more likely to develop in:  Older people.  People who smoke.  People who have diabetes mellitus.  People who are overweight (obese).  Athletes who exercise vigorously.  People who have a family history. What are the signs or symptoms? Symptoms of this condition include:  A feeling that your heart is beating rapidly or irregularly.  A feeling of discomfort or pain in your chest.  Shortness of breath.  Sudden light-headedness or weakness.  Getting tired easily during exercise. In some cases, there are no symptoms. How is this diagnosed? Your health care provider may be able to detect atrial fibrillation when  taking your pulse. If detected, this condition may be diagnosed with:  Electrocardiogram (ECG).  Ambulatory cardiac monitor. This device records your heartbeats for 24 hours or more.  Transthoracic echocardiogram (TTE) to evaluate how blood flows through your heart.  Transesophageal echocardiogram (TEE) to view more detailed images of your heart.  A stress test.  Imaging tests, such as a CT scan or chest X-ray.  Blood tests. How is this treated? This condition may be treated with:  Medicines to slow down the heart rate or bring the heart's rhythm back to normal.  Medicines to prevent blood clots from forming.  Electrical cardioversion. This delivers a low-energy shock to the heart to reset its rhythm.  Ablation. This procedure destroys the part of the heart tissue that sends abnormal signals.  Left atrial appendage occlusion/excision. This seals off a common place in the atria where blood clots can form (left atrial appendage). The goal of treatment is to prevent blood clots from forming and to keep your heart beating at a normal rate and rhythm. Treatment depends on underlying medical conditions and how you feel when you are experiencing fibrillation. Follow these instructions at home: Medicines  Take over-the counter and prescription medicines only as told by your health care provider.  If your health care provider prescribed a blood-thinning medicine (anticoagulant), take it exactly as told. Taking too much blood-thinning medicine can cause bleeding. Taking too little can enable a blood clot to form and travel to the brain, causing a stroke. Lifestyle      Do not use any products that contain nicotine or tobacco, such as cigarettes and e-cigarettes. If you need help quitting, ask your health   care provider.  Do not drink beverages that contain caffeine, such as coffee, soda, and tea.  Follow diet instructions as told by your health care provider.  Exercise regularly as  told by your health care provider.  Do not drink alcohol. General instructions  If you have obstructive sleep apnea, manage your condition as told by your health care provider.  Maintain a healthy weight. Do not use diet pills unless your health care provider approves. Diet pills may make heart problems worse.  Keep all follow-up visits as told by your health care provider. This is important. Contact a health care provider if you:  Notice a change in the rate, rhythm, or strength of your heartbeat.  Are taking an anticoagulant and you notice increased bruising.  Tire more easily when you exercise or exert yourself.  Have a sudden change in weight. Get help right away if you have:   Chest pain, abdominal pain, sweating, or weakness.  Difficulty breathing.  Blood in your vomit, stool (feces), or urine.  Any symptoms of a stroke. "BE FAST" is an easy way to remember the main warning signs of a stroke: ? B - Balance. Signs are dizziness, sudden trouble walking, or loss of balance. ? E - Eyes. Signs are trouble seeing or a sudden change in vision. ? F - Face. Signs are sudden weakness or numbness of the face, or the face or eyelid drooping on one side. ? A - Arms. Signs are weakness or numbness in an arm. This happens suddenly and usually on one side of the body. ? S - Speech. Signs are sudden trouble speaking, slurred speech, or trouble understanding what people say. ? T - Time. Time to call emergency services. Write down what time symptoms started.  Other signs of a stroke, such as: ? A sudden, severe headache with no known cause. ? Nausea or vomiting. ? Seizure. These symptoms may represent a serious problem that is an emergency. Do not wait to see if the symptoms will go away. Get medical help right away. Call your local emergency services (911 in the [place] Do not drive yourself to the hospital. Summary  Atrial fibrillation is a type of irregular or rapid heartbeat  (arrhythmia).  Symptoms include a feeling that your heart is beating fast or irregularly. In some cases, you may not have symptoms.  The condition is treated with medicines to slow down the heart rate or bring the heart's rhythm back to normal. You may also need blood-thinning medicines to prevent blood clots.  Get help right away if you have symptoms or signs of a stroke. This information is not intended to replace advice given to you by your health care provider. Make sure you discuss any questions you have with your health care provider. Document Released: 07/08/2005 Document Revised: 08/28/2017 Document Reviewed: 08/29/2017 Elsevier Patient Education  2020 [employer] Inc[employer]  

## 2019-03-06 NOTE — Consult Note (Signed)
Cardiology Consultation:   Patient ID: Craig Whitehead MRN: [id number]; DOB: 07-Jun-1928  Admit date: 02/25/2019 Date of Consult: 03/06/2019  Primary Care Provider: Josetta Huddle, MD Primary Cardiologist: No primary care provider on file. Ellyn Hack Primary Electrophysiologist:  None    Patient Profile:   Craig Whitehead is a 85 y.o. male with a hx of atrial fib who is being seen today for the evaluation of atrial fib with a slow VR at the request of [facility].  History of Present Illness:   Craig Whitehead was admitted with sob. He has a h/o atrial fib dating back at least 2 years. He presented on this admission with sob and swelling. He does not feel palpitations. On tele he has been noted to have some slow VR with 2 second pauses. At home he does not have dizzy spells or syncope. No vision changes. He has become sedentary and has had trouble with his gait and is fairly inactive. His dyspnea is now better. His rates have not been too fast.   Heart Pathway Score:     Past Medical History:  Diagnosis Date  . Anxiety   . Atrial fibrillation ([place])   . Bladder cancer ([place])   . BPH (benign prostatic hypertrophy)   . Diabetes mellitus ([place])   . Diarrhea   . Difficult intubation   . ED (erectile dysfunction)   . GERD (gastroesophageal reflux disease)   . Hypercholesteremia   . Hypertension   . NPH (normal pressure hydrocephalus) ([place])   . Seborrheic dermatitis   . Vitamin D deficiency     Past Surgical History:  Procedure Laterality Date  . BLADDER SURGERY     x 4 for bladder cancer  . BRAIN SURGERY    . CATARACT EXTRACTION Bilateral   . INGUINAL HERNIA REPAIR Bilateral   . TRANSTHORACIC ECHOCARDIOGRAM  05/2016   [facility]: EF 45-50%. Mildly reduced function (likely related to A. fib). Marketed LA dilation. Moderate RA dilation. Aortic sclerosis without stenosis. Mild-moderate TR. Mild-moderate pulmonary tension. Small pericardial effusion versus pericardial fat  .  VENTRICULOPERITONEAL SHUNT  2002     Home Medications:  Prior to Admission medications   Medication Sig Start Date End Date Taking? Authorizing Provider  aspirin EC 81 MG tablet Take 1 tablet (81 mg total) by mouth daily. 03/04/19   Hosie Poisson, MD  cephALEXin (KEFLEX) 500 MG capsule Take 1 capsule (500 mg total) by mouth every 12 (twelve) hours. 03/05/19   Hosie Poisson, MD  cholestyramine (QUESTRAN) 4 g packet Take 1 packet (4 g total) by mouth daily. Patient not taking: Reported on 02/25/2019 08/06/16   Ladene Artist, MD  feeding supplement, ENSURE ENLIVE, (ENSURE ENLIVE) LIQD Take 237 mLs by mouth daily. 03/05/19   Hosie Poisson, MD  feeding supplement, GLUCERNA SHAKE, (GLUCERNA SHAKE) LIQD Take 237 mLs by mouth 3 (three) times daily between meals. Patient not taking: Reported on 02/25/2019 04/01/17   Eugenie Filler, MD  folic acid (FOLVITE) 1 MG tablet Take 1 tablet (1 mg total) by mouth daily. Patient not taking: Reported on 02/25/2019 04/02/17   Eugenie Filler, MD  insulin aspart (NOVOLOG) 100 UNIT/ML injection CBG 70 - 120: 0 units CBG 121 - 150: 1 unit CBG 151 - 200: 2 units CBG 201 - 250: 3 units CBG 251 - 300: 5 units CBG 301 - 350: 7 units CBG 351 - 400: 9 units 03/04/19   Hosie Poisson, MD  insulin glargine (LANTUS) 100 UNIT/ML injection Inject  0.14 mLs (14 Units total) into the skin daily. 03/04/19   Hosie Poisson, MD  lidocaine (LIDODERM) 5 % Place 1 patch onto the skin daily. Remove & Discard patch within 12 hours or as directed by MD Patient not taking: Reported on 02/25/2019 07/20/18   Providence Lanius A, PA-C  sodium bicarbonate 650 MG tablet Take 1 tablet (650 mg total) by mouth daily. 03/05/19   Hosie Poisson, MD  tamsulosin (FLOMAX) 0.4 MG CAPS capsule Take 1 capsule (0.4 mg total) by mouth daily. Patient not taking: Reported on 02/25/2019 04/02/17   Eugenie Filler, MD  thiamine 100 MG tablet Take 1 tablet (100 mg total) by mouth daily. Patient not taking: Reported on  02/25/2019 04/02/17   Eugenie Filler, MD  Triamcinolone Acetonide (TRIAMCINOLONE 0.1 % CREAM : EUCERIN) CREA Apply 1 application topically 2 (two) times daily. Patient not taking: Reported on 02/25/2019 04/01/17   Eugenie Filler, MD  vitamin B-12 (CYANOCOBALAMIN) 500 MCG tablet Take 1 tablet (500 mcg total) by mouth daily. 03/05/19   Hosie Poisson, MD    Inpatient Medications: Scheduled Meds: . aspirin EC  81 mg Oral Daily  . cephALEXin  500 mg Oral Q12H  . feeding supplement (ENSURE ENLIVE)  237 mL Oral Q24H  . feeding supplement (GLUCERNA SHAKE)  237 mL Oral TID BM  . folic acid  1 mg Oral Daily  . heparin injection (subcutaneous)  5,000 Units Subcutaneous Q8H  . insulin aspart  0-5 Units Subcutaneous QHS  . insulin aspart  0-9 Units Subcutaneous TID WC  . mouth rinse  15 mL Mouth Rinse BID  . sodium bicarbonate  650 mg Oral Daily  . tamsulosin  0.4 mg Oral Daily  . thiamine  100 mg Oral Daily  . vitamin B-12  500 mcg Oral Daily   Continuous Infusions:  PRN Meds: acetaminophen, ondansetron (ZOFRAN) IV  Allergies:   No Known Allergies  Social History:   Social History   Socioeconomic History  . Marital status: Divorced    Spouse name: Not on file  . Number of children: Not on file  . Years of education: Not on file  . Highest education level: Not on file  Occupational History    Comment: Partially retired Restaurant manager, fast food  Social Needs  . Financial resource strain: Not on file  . Food insecurity    Worry: Not on file    Inability: Not on file  . Transportation needs    Medical: Not on file    Non-medical: Not on file  Tobacco Use  . Smoking status: Former Smoker    Quit date: 07/06/1994    Years since quitting: 24.6  . Smokeless tobacco: Never Used  Substance and Sexual Activity  . Alcohol use: Yes    Alcohol/week: 1.0 standard drinks    Types: 1 Shots of liquor per week    Comment: 1 bourbon daily  . Drug use: No  . Sexual activity: Not on file  Lifestyle   . Physical activity    Days per week: Not on file    Minutes per session: Not on file  . Stress: Not on file  Relationships  . Social Herbalist on phone: Not on file    Gets together: Not on file    Attends religious service: Not on file    Active member of club or organization: Not on file    Attends meetings of clubs or organizations: Not on file    Relationship status:  Not on file  . Intimate partner violence    Fear of current or ex partner: Not on file    Emotionally abused: Not on file    Physically abused: Not on file    Forced sexual activity: Not on file  Other Topics Concern  . Not on file  Social History Narrative   He has a degree in chiropractic medicine. He practice [employer] and for years, then came back at a retirement because he enjoyed doing it. Line he quit smoking 50 years ago. He drinks 1 drink of bourbon a day.      The patient apparently had been evicted from his apartment by his landlord for unclamping this. He is therefore been living in his office that he uses for his chiropractic practice. He also has been living in his car.    He apparently has a significant other/girlfriend that he may be able to move in with.    Family History:    Family History  Problem Relation Age of Onset  . Rheum arthritis Mother   . Cancer Father      ROS:  Please see the history of present illness.   All other ROS reviewed and negative.     Physical Exam/Data:   Vitals:   03/05/19 1314 03/05/19 2122 03/06/19 0500 03/06/19 0513  BP: 97/67 113/62  115/69  Pulse: 76 97  66  Resp: 18 16  20   Temp: 98.2 F (36.8 C) 98.1 F (36.7 C)  98.4 F (36.9 C)  TempSrc: Oral Oral  Oral  SpO2: 98% 99%  96%  Weight:   56.9 kg   Height:        Intake/Output Summary (Last 24 hours) at 03/06/2019 0820 Last data filed at 03/06/2019 0442 Gross per 24 hour  Intake 960 ml  Output 2200 ml  Net -1240 ml   Last 3 Weights 03/06/2019 03/05/2019 03/04/2019  Weight  (lbs) 125 lb 7.1 oz 127 lb 10.3 oz 130 lb 15.3 oz  Weight (kg) 56.9 kg 57.9 kg 59.4 kg     Body mass index is 19.65 kg/m.  General:  diskempt appearing, well developed, in no acute distress HEENT: normal Lymph: no adenopathy Neck: no JVD Endocrine:  No thryomegaly Vascular: No carotid bruits; FA pulses 2+ bilaterally without bruits  Cardiac:  normal S1, S2; IRIRR; no murmur  Lungs:  clear to auscultation bilaterally, no wheezing, rhonchi or rales  Abd: soft, nontender, no hepatomegaly  Ext: no edema Musculoskeletal:  No deformities, BUE and BLE strength normal and equal Skin: warm and dry  Neuro:  CNs 2-12 intact, no focal abnormalities noted Psych:  Normal affect   EKG:  The EKG was personally reviewed and demonstrates:  Atrial fib Telemetry:  Telemetry was personally reviewed and demonstrates:  Atrial fib with pauses of 2 seconds  Relevant CV Studies: none  Laboratory Data:  High Sensitivity Troponin:   Recent Labs  Lab 02/25/19 1605 02/25/19 2151  TROPONINIHS 34* 38*     Cardiac EnzymesNo results for input(s): TROPONINI in the last 168 hours. No results for input(s): TROPIPOC in the last 168 hours.  Chemistry Recent Labs  Lab 03/03/19 0456 03/04/19 0448 03/05/19 0956 03/05/19 1826  NA 137 138 136  --   K 4.9 4.6 5.2* 5.4*  CL 108 110 106  --   CO2 20* 17* 22  --   GLUCOSE 167* 194* 200*  --   BUN 59* 58* 57*  --   CREATININE 2.02* 1.79* 1.79*  --  CALCIUM 7.3* 7.6* 8.1*  --   GFRNONAA 28* 33* 33*  --   GFRAA 33* 38* 38*  --   ANIONGAP 9 11 8   --     Recent Labs  Lab 03/03/19 0456  PROT 5.2*  ALBUMIN 2.6*  AST 21  ALT 30  ALKPHOS 56  BILITOT 0.3   Hematology Recent Labs  Lab 03/03/19 0456 03/04/19 0448 03/05/19 0956  WBC 5.1 5.1 5.2  RBC 2.08* 2.11*  2.11* 2.16*  HGB 7.2* 7.2* 7.5*  HCT 23.2* 23.1* 23.7*  MCV 111.5* 109.5* 109.7*  MCH 34.6* 34.1* 34.7*  MCHC 31.0 31.2 31.6  RDW 14.6 14.6 14.6  PLT 148* 176 190   BNPNo results for  input(s): BNP, PROBNP in the last 168 hours.  DDimer No results for input(s): DDIMER in the last 168 hours.   Radiology/Studies:  Ct Head Wo Contrast  Result Date: 03/04/2019 CLINICAL DATA:  [place] from the bed. Altered level of consciousness today. EXAM: CT HEAD WITHOUT CONTRAST TECHNIQUE: Contiguous axial images were obtained from the base of the skull through the vertex without intravenous contrast. COMPARISON:  02/25/2019 FINDINGS: Brain: Generalized atrophy. Chronic small-vessel ischemic changes of the cerebral hemispheric white matter. VP shunt placed from a right frontal approach with its tip in the right lateral ventricle near the foramen of Monro. Ventricular size is stable. No sign of acute infarction, mass lesion, change in ventricular size or extra-axial collection. Vascular: There is atherosclerotic calcification of the major vessels at the base of the brain. Skull: Negative Sinuses/Orbits: Clear/normal Other: None IMPRESSION: No acute or traumatic finding. Atrophy and chronic small-vessel ischemic changes. VP shunt appears unchanged. Ventricular size is stable. Electronically Signed   By: Nelson Chimes M.D.   On: 03/04/2019 16:31   Dg Chest Port 1 View  Result Date: 03/04/2019 CLINICAL DATA:  Generalized weakness. Fall. Tachypnea. EXAM: PORTABLE CHEST 1 VIEW COMPARISON:  Radiograph 02/25/2019 FINDINGS: Unchanged heart size and mediastinal contours. Tortuous thoracic aorta as before. No pulmonary edema, focal airspace disease, large pleural effusion or pneumothorax. Chronic reticular left lung base opacities, likely scarring. Shunt catheter tubing courses over the right chest. The bones are under mineralized. Skin folds project over the left chest wall. IMPRESSION: No acute chest finding. Electronically Signed   By: Keith Rake M.D.   On: 03/04/2019 13:39    Assessment and Plan:   1. Atrial fib with a RVR/CVR/SVR - I have reviewed his tele. He has asymptomatic pauses. At home he has  not had syncope or episodes where he transiently loses his vision. When he came in his rates were fast but I suspect he was dry. In the absence of symptoms, I would not recommend a PPM. His rates off of AV nodal blocking drugs are controlled. For this reason, I would continue his current meds and avoid anything to slow him down. 2. With regard to systemic anti-coagulation, he is at risk for stroke but he has problems with hematuria and balance and is a fall risk. For this reason, I do not favor systemic anti-coagulation. 3. Disposition - ok for DC from Cardiology perspective. He has not seen Dr. Ellyn Hack in over 2 years and will need to make an appointment with him in the coming next 2 months.   Jaylynn Mcaleer,M.D.  [facility] will sign off.   Medication Recommendations:  none Other recommendations (labs, testing, etc):  none Follow up as an outpatient:  Dr. Ellyn Hack  For questions or updates, please contact [place] Please  consult [email or web address] for contact info under     Signed, Cristopher Peru, MD  03/06/2019 8:20 AM

## 2019-03-09 DIAGNOSIS — N179 Acute kidney failure, unspecified: Secondary | ICD-10-CM | POA: Diagnosis not present

## 2019-03-09 DIAGNOSIS — N3000 Acute cystitis without hematuria: Secondary | ICD-10-CM | POA: Diagnosis not present

## 2019-03-09 DIAGNOSIS — R6520 Severe sepsis without septic shock: Secondary | ICD-10-CM | POA: Diagnosis not present

## 2019-03-09 DIAGNOSIS — A419 Sepsis, unspecified organism: Secondary | ICD-10-CM | POA: Diagnosis not present

## 2019-03-16 DIAGNOSIS — I959 Hypotension, unspecified: Secondary | ICD-10-CM | POA: Diagnosis not present

## 2019-03-16 DIAGNOSIS — Z794 Long term (current) use of insulin: Secondary | ICD-10-CM | POA: Diagnosis not present

## 2019-03-17 DIAGNOSIS — I959 Hypotension, unspecified: Secondary | ICD-10-CM | POA: Diagnosis not present

## 2019-03-17 DIAGNOSIS — E119 Type 2 diabetes mellitus without complications: Secondary | ICD-10-CM | POA: Diagnosis not present

## 2019-03-17 DIAGNOSIS — Z794 Long term (current) use of insulin: Secondary | ICD-10-CM | POA: Diagnosis not present

## 2019-03-17 DIAGNOSIS — R001 Bradycardia, unspecified: Secondary | ICD-10-CM | POA: Diagnosis not present

## 2019-03-18 DIAGNOSIS — R5381 Other malaise: Secondary | ICD-10-CM | POA: Diagnosis not present

## 2019-03-18 DIAGNOSIS — N179 Acute kidney failure, unspecified: Secondary | ICD-10-CM | POA: Diagnosis not present

## 2019-03-18 DIAGNOSIS — E440 Moderate protein-calorie malnutrition: Secondary | ICD-10-CM | POA: Diagnosis not present

## 2019-03-18 DIAGNOSIS — Z9189 Other specified personal risk factors, not elsewhere classified: Secondary | ICD-10-CM | POA: Diagnosis not present

## 2019-03-22 ENCOUNTER — Other Ambulatory Visit: Payer: Self-pay

## 2019-03-22 ENCOUNTER — Ambulatory Visit (INDEPENDENT_AMBULATORY_CARE_PROVIDER_SITE_OTHER): Payer: Medicare HMO | Admitting: Cardiology

## 2019-03-22 ENCOUNTER — Encounter: Payer: Self-pay | Admitting: Cardiology

## 2019-03-22 VITALS — BP 100/60 | HR 76 | Temp 97.2°F | Ht 67.0 in | Wt 117.0 lb

## 2019-03-22 DIAGNOSIS — I4821 Permanent atrial fibrillation: Secondary | ICD-10-CM | POA: Diagnosis not present

## 2019-03-22 DIAGNOSIS — N3000 Acute cystitis without hematuria: Secondary | ICD-10-CM | POA: Diagnosis not present

## 2019-03-22 DIAGNOSIS — E168 Other specified disorders of pancreatic internal secretion: Secondary | ICD-10-CM | POA: Diagnosis not present

## 2019-03-22 DIAGNOSIS — E43 Unspecified severe protein-calorie malnutrition: Secondary | ICD-10-CM

## 2019-03-22 DIAGNOSIS — N138 Other obstructive and reflux uropathy: Secondary | ICD-10-CM | POA: Diagnosis not present

## 2019-03-22 DIAGNOSIS — I480 Paroxysmal atrial fibrillation: Secondary | ICD-10-CM | POA: Diagnosis not present

## 2019-03-22 DIAGNOSIS — I9589 Other hypotension: Secondary | ICD-10-CM | POA: Diagnosis not present

## 2019-03-22 DIAGNOSIS — E1121 Type 2 diabetes mellitus with diabetic nephropathy: Secondary | ICD-10-CM | POA: Diagnosis not present

## 2019-03-22 NOTE — Patient Instructions (Addendum)
Medication Instructions:  NO CHANGES If you need a refill on your cardiac medications before your next appointment, please call your pharmacy.   Lab work: NOT NEEDED If you have labs (blood work) drawn today and your tests are completely normal, you will receive your results only by: Marland Kitchen MyChart Message (if you have MyChart) OR . A paper copy in the mail If you have any lab test that is abnormal or we need to change your treatment, we will call you to review the results.  Testing/Procedures: NOT NEEDED  Follow-Up: At [facility], you and your health needs are our priority.  As part of our continuing mission to provide you with exceptional heart care, we have created designated Provider Care Teams.  These Care Teams include your primary Cardiologist (physician) and Advanced Practice Providers (APPs -  Physician Assistants and Nurse Practitioners) who all work together to provide you with the care you need, when you need it. . You will need a follow up appointment in 12 month AUG 2021.  Please call our office 2 months in advance to schedule this appointment.  You may see Glenetta Hew, MD or one of the following Advanced Practice Providers on your designated Care Team:   . Rosaria Ferries, PA-C . Jory Sims, DNP, ANP  Any Other Special Instructions Will Be Listed Below (If Applicable).

## 2019-03-22 NOTE — Progress Notes (Signed)
PCP: Craig Huddle, MD  Clinic Note: Chief Complaint  Patient presents with  . Hospitalization Follow-up    Recently discharged with UTI and dehydration  . Atrial Fibrillation    Totally asymptomatic.  On no medications.    HPI: Craig Whitehead is a 85 y.o. male with a long-standing Persistent Afib & DM-2 who presents today for hospital f/u.  CHADSVASC = 2, but with frequent falls, no OAC.  ASA only.  Craig Whitehead was last seen in Dec 2017   Recent Hospitalizations:  ER visit 02/25/2019 -- admitted for [facility] - dehydration, UTI.  Noted ~2-2.5 sec pauses on Tele with bradycardia.    Seen by Craig Whitehead in consult -- aSx pauses with no syncope/near syncope.  - No PPM indicated. Avoid AVN agents. no OAC 2/2 falls.  Studies Personally Reviewed - (if available, images/films reviewed: From Epic Chart or Care Everywhere)  Echo 03/11/2019 - poor images. Difficult to assess EF.    Interval History: Doing OK - just global weakness & unsteady gait - now wheelchair bound.  Easilyu fatigued - but denies any real Cardiac Sx. he really does not feel anything to suggest symptomatic A. fib.  No heart failure symptoms or angina symptoms.  He is quite frail with signs of protein malnutrition.  We talked at the importance of taking supplemental Glucerna shakes with his meals.  He is currently living in a assisted living facility, but is not sure how long that will last.  He is so weak now that he is currently in a wheelchair.  He is easily fatigued, and is not yet stronger to fully walk.  When he does walk his balance is quite off.  CV ROS:  No chest pain or shortness of breath with rest or exertion. No PND, orthopnea or edema. No palpitations, lightheadedness, dizziness, weakness or syncope/near syncope. No TIA/amaurosis fugax symptoms. No melena, hematochezia, hematuria, or epstaxis. No claudication - not ambulatory  ROS: A comprehensive was performed. Review of Systems  Constitutional:  Positive for malaise/fatigue (generalized) and weight loss (He lost a chunk of weight while in the hospital this last time).  HENT: Negative for nosebleeds.   Respiratory: Negative for cough, shortness of breath and wheezing.        Certain foods make him sneeze  Cardiovascular: Negative for leg swelling.  Gastrointestinal: Negative for blood in stool, heartburn and melena.  Genitourinary: Negative for dysuria (not since starting Abx) and hematuria.  Musculoskeletal: Negative for falls and joint pain.  Neurological: Positive for dizziness (poor balance) and weakness (global). Negative for focal weakness and headaches.  Psychiatric/Behavioral: Positive for memory loss. The patient has insomnia (has not had complete night sleep in months. daytime napping).   All other systems reviewed and are negative.   The patient does not have symptoms concerning for COVID-19 infection (fever, chills, cough, or new shortness of breath).  The patient is practicing social distancing.   COVID-19 Education: The signs and symptoms of COVID-19 were discussed with the patient and how to seek care for testing (follow up with PCP or arrange E-visit).   The importance of social distancing was discussed today.   I have reviewed and (if needed) personally updated the patient's problem list, medications, allergies, past medical and surgical history, social and family history.   Past Medical History:  Diagnosis Date  . Anxiety   . Atrial fibrillation ([place])   . Bladder cancer ([place])   . BPH (benign prostatic hypertrophy)   . Diabetes mellitus (  [place])   . Diarrhea   . Difficult intubation   . ED (erectile dysfunction)   . GERD (gastroesophageal reflux disease)   . Hypercholesteremia   . Hypertension   . NPH (normal pressure hydrocephalus) ([place])   . Seborrheic dermatitis   . Vitamin D deficiency     Past Surgical History:  Procedure Laterality Date  . BLADDER SURGERY     x 4 for bladder cancer  . BRAIN  SURGERY    . CATARACT EXTRACTION Bilateral   . INGUINAL HERNIA REPAIR Bilateral   . TRANSTHORACIC ECHOCARDIOGRAM  05/2016   [facility]: EF 45-50%. Mildly reduced function (likely related to A. fib). Marketed LA dilation. Moderate RA dilation. Aortic sclerosis without stenosis. Mild-moderate TR. Mild-moderate pulmonary tension. Small pericardial effusion versus pericardial fat  . VENTRICULOPERITONEAL SHUNT  2002    Current Meds  Medication Sig  . aspirin EC 81 MG tablet Take 1 tablet (81 mg total) by mouth daily.  . cephALEXin (KEFLEX) 500 MG capsule Take 1 capsule (500 mg total) by mouth every 12 (twelve) hours.  . cholestyramine (QUESTRAN) 4 g packet Take 1 packet (4 g total) by mouth daily.  . feeding supplement, ENSURE ENLIVE, (ENSURE ENLIVE) LIQD Take 237 mLs by mouth daily.  . feeding supplement, GLUCERNA SHAKE, (GLUCERNA SHAKE) LIQD Take 237 mLs by mouth 3 (three) times daily between meals.  . folic acid (FOLVITE) 1 MG tablet Take 1 tablet (1 mg total) by mouth daily.  . insulin aspart (NOVOLOG) 100 UNIT/ML injection CBG 70 - 120: 0 units CBG 121 - 150: 1 unit CBG 151 - 200: 2 units CBG 201 - 250: 3 units CBG 251 - 300: 5 units CBG 301 - 350: 7 units CBG 351 - 400: 9 units  . insulin glargine (LANTUS) 100 UNIT/ML injection Inject 0.14 mLs (14 Units total) into the skin daily.  . sodium bicarbonate 650 MG tablet Take 1 tablet (650 mg total) by mouth daily.  . tamsulosin (FLOMAX) 0.4 MG CAPS capsule Take 1 capsule (0.4 mg total) by mouth daily.  Marland Kitchen thiamine 100 MG tablet Take 1 tablet (100 mg total) by mouth daily.  . Triamcinolone Acetonide (TRIAMCINOLONE 0.1 % CREAM : EUCERIN) CREA Apply 1 application topically 2 (two) times daily.  . vitamin B-12 (CYANOCOBALAMIN) 500 MCG tablet Take 1 tablet (500 mcg total) by mouth daily.    No Known Allergies  Social History   Tobacco Use  . Smoking status: Former Smoker    Quit date: 07/06/1994    Years since  quitting: 24.7  . Smokeless tobacco: Never Used  Substance Use Topics  . Alcohol use: Yes    Alcohol/week: 1.0 standard drinks    Types: 1 Shots of liquor per week    Comment: 1 bourbon daily  . 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   he quit smoking >50 years ago.    He drinks 1 drink of bourbon a day.      Currently living in an assisted living facility following his discharge.  Is not sure where he will go after that.    family history includes Cancer in his father; Rheum arthritis in his mother.  Wt Readings from Last 3 Encounters:  03/22/19 117 lb (53.1 kg)  03/06/19 125 lb 7.1 oz (56.9 kg)  01/13/19  130 lb (59 kg)    PHYSICAL EXAM BP 100/60 (BP Location: Left Arm, Patient Position: Sitting, Cuff Size: Normal)   Pulse 76   Temp (!) 97.2 F (36.2 C)   Ht 5\' 7"  (1.702 m)   Wt 117 lb (53.1 kg)   BMI 18.32 kg/m  Physical Exam  Constitutional: He is oriented to person, place, and time.  Thin/cachectic, frail elderly gentleman.  He does appear to be of stated age.  Very weak, seated in wheelchair.  HENT:  Head: Atraumatic.  Temporal wasting  Neck: Normal range of motion. Neck supple. No hepatojugular reflux and no JVD present. Carotid bruit is present (Soft bilateral carotid bruits).  Cardiovascular: Normal rate. An irregularly irregular rhythm present. PMI is not displaced (Somewhat hyperdynamic). Exam reveals decreased pulses (Decreased but palpable bilateral pedal pulses.). Exam reveals no gallop and no friction rub.  Murmur (1-2/6 SEM at RUSB radiates to carotids) heard. Pulmonary/Chest: Effort normal. No respiratory distress. He has no wheezes. He has no rales.  Diffuse left basal crackles that clear with cough  Abdominal: Soft. Bowel sounds are normal. He exhibits no distension. There is no abdominal  tenderness. There is no rebound.  Scaphoid abdomen.  No HSM  Musculoskeletal: Normal range of motion.        General: No edema.  Neurological: He is alert and oriented to person, place, and time.  Psychiatric: He has a normal mood and affect. His behavior is normal. Judgment and thought content normal.  Vitals reviewed.   Adult ECG Report  Rate: 76 ;  Rhythm: atrial fibrillation and Poor R wave progression to suggest prior anterior MI.  But otherwise normal axis, intervals durations; low voltage.  Single PVC noted versus aberrantly conducted beat much  Narrative Interpretation: Stable EKG   Other studies Reviewed: Additional studies/ records that were reviewed today include:  Recent Labs:   Lab Results  Component Value Date   CREATININE 1.83 (H) 03/06/2019   BUN 54 (H) 03/06/2019   NA 136 03/06/2019   K 5.2 (H) 03/06/2019   CL 106 03/06/2019   CO2 22 03/06/2019   Lab Results  Component Value Date   LABPROT 19.1 (H) 02/25/2019   Lab Results  Component Value Date   CALCIUM 8.1 (L) 03/06/2019    ASSESSMENT / PLAN: Problem List Items Addressed This Visit    Protein-calorie malnutrition, severe (Chronic)    He needs to ensure that he has 3 full meals a day and supplements his meals with Glucerna shake.  He also does an Ensure shake.   The major concern is maybe what does he do once he leaves the facility is currently in.      Relevant Orders   EKG 12-Lead (Completed)   Permanent atrial fibrillation ([place]): CHA2DS2Vasc 2 (age). Rate controlled without medications asymptomatic - Primary (Chronic)    Totally asymptomatic in A. fib.  Is not on any rate controlling agents.  He is on aspirin alone because of concerns for falls.  He has significant bruising anyway.  With his advanced age and profound nutritional deficiencies, I think that it is probably wise to avoid full anticoagulation.  Tend agree with Dr. Tanna Furry note.  Would only need to watch for signs and symptoms of  bradycardia.  He has not had any.         I spent a total of 20 minutes with the patient and chart review. >  50% of the time was spent in direct patient consultation.  5-10 minutes  spent with chart review, reviewing recent hospitalization.  Current medicines are reviewed at length with the patient today.  (+/- concerns) n/a The following changes have been made:  none  Patient Instructions  Medication Instructions:  NO CHANGES If you need a refill on your cardiac medications before your next appointment, please call your pharmacy.   Lab work: NOT NEEDED If you have labs (blood work) drawn today and your tests are completely normal, you will receive your results only by: Marland Kitchen MyChart Message (if you have MyChart) OR . A paper copy in the mail If you have any lab test that is abnormal or we need to change your treatment, we will call you to review the results.  Testing/Procedures: NOT NEEDED  Follow-Up: At [facility], you and your health needs are our priority.  As part of our continuing mission to provide you with exceptional heart care, we have created designated Provider Care Teams.  These Care Teams include your primary Cardiologist (physician) and Advanced Practice Providers (APPs -  Physician Assistants and Nurse Practitioners) who all work together to provide you with the care you need, when you need it. . You will need a follow up appointment in 12 month AUG 2021.  Please call our office 2 months in advance to schedule this appointment.  You may see Glenetta Hew, MD or one of the following Advanced Practice Providers on your designated Care Team:   . Rosaria Ferries, PA-C . Jory Sims, DNP, ANP  Any Other Special Instructions Will Be Listed Below (If Applicable).    Studies Ordered:   Orders Placed This Encounter  Procedures  . EKG 12-Lead      Glenetta Hew, M.D., M.S. Interventional Cardiologist   Pager # [phone] Phone # [phone] [address] [place], [place] [address]   Thank you for choosing [facility]!!

## 2019-03-23 ENCOUNTER — Encounter: Payer: Self-pay | Admitting: Cardiology

## 2019-03-23 NOTE — Assessment & Plan Note (Signed)
Totally asymptomatic in A. fib.  Is not on any rate controlling agents.  He is on aspirin alone because of concerns for falls.  He has significant bruising anyway.  With his advanced age and profound nutritional deficiencies, I think that it is probably wise to avoid full anticoagulation.  Tend agree with Dr. Tanna Furry note.  Would only need to watch for signs and symptoms of bradycardia.  He has not had any.

## 2019-03-23 NOTE — Assessment & Plan Note (Signed)
He needs to ensure that he has 3 full meals a day and supplements his meals with Glucerna shake.  He also does an Ensure shake.   The major concern is maybe what does he do once he leaves the facility is currently in.

## 2019-03-30 DIAGNOSIS — N178 Other acute kidney failure: Secondary | ICD-10-CM | POA: Diagnosis not present

## 2019-03-30 DIAGNOSIS — D538 Other specified nutritional anemias: Secondary | ICD-10-CM | POA: Diagnosis not present

## 2019-03-30 DIAGNOSIS — R5381 Other malaise: Secondary | ICD-10-CM | POA: Diagnosis not present

## 2019-03-30 DIAGNOSIS — E119 Type 2 diabetes mellitus without complications: Secondary | ICD-10-CM | POA: Diagnosis not present

## 2019-03-30 DIAGNOSIS — I480 Paroxysmal atrial fibrillation: Secondary | ICD-10-CM | POA: Diagnosis not present

## 2019-03-30 DIAGNOSIS — R6520 Severe sepsis without septic shock: Secondary | ICD-10-CM | POA: Diagnosis not present

## 2019-03-30 DIAGNOSIS — E440 Moderate protein-calorie malnutrition: Secondary | ICD-10-CM | POA: Diagnosis not present

## 2019-04-05 DIAGNOSIS — Z79899 Other long term (current) drug therapy: Secondary | ICD-10-CM | POA: Diagnosis not present

## 2019-04-07 DIAGNOSIS — N178 Other acute kidney failure: Secondary | ICD-10-CM | POA: Diagnosis not present

## 2019-04-07 DIAGNOSIS — N3000 Acute cystitis without hematuria: Secondary | ICD-10-CM | POA: Diagnosis not present

## 2019-04-07 DIAGNOSIS — E119 Type 2 diabetes mellitus without complications: Secondary | ICD-10-CM | POA: Diagnosis not present

## 2019-04-07 DIAGNOSIS — Z794 Long term (current) use of insulin: Secondary | ICD-10-CM | POA: Diagnosis not present

## 2019-04-09 DIAGNOSIS — Z79899 Other long term (current) drug therapy: Secondary | ICD-10-CM | POA: Diagnosis not present

## 2019-04-13 DIAGNOSIS — N3000 Acute cystitis without hematuria: Secondary | ICD-10-CM | POA: Diagnosis not present

## 2019-04-13 DIAGNOSIS — N178 Other acute kidney failure: Secondary | ICD-10-CM | POA: Diagnosis not present

## 2019-04-20 DIAGNOSIS — R0689 Other abnormalities of breathing: Secondary | ICD-10-CM | POA: Diagnosis not present

## 2019-04-20 DIAGNOSIS — Z20828 Contact with and (suspected) exposure to other viral communicable diseases: Secondary | ICD-10-CM | POA: Diagnosis not present

## 2019-04-20 DIAGNOSIS — R05 Cough: Secondary | ICD-10-CM | POA: Diagnosis not present

## 2019-04-20 DIAGNOSIS — E440 Moderate protein-calorie malnutrition: Secondary | ICD-10-CM | POA: Diagnosis not present

## 2019-04-20 DIAGNOSIS — I5189 Other ill-defined heart diseases: Secondary | ICD-10-CM | POA: Diagnosis not present

## 2019-04-21 DIAGNOSIS — J189 Pneumonia, unspecified organism: Secondary | ICD-10-CM | POA: Diagnosis not present

## 2019-04-22 DIAGNOSIS — R0689 Other abnormalities of breathing: Secondary | ICD-10-CM | POA: Diagnosis not present

## 2019-04-22 DIAGNOSIS — E119 Type 2 diabetes mellitus without complications: Secondary | ICD-10-CM | POA: Diagnosis not present

## 2019-04-22 DIAGNOSIS — D518 Other vitamin B12 deficiency anemias: Secondary | ICD-10-CM | POA: Diagnosis not present

## 2019-04-22 DIAGNOSIS — E538 Deficiency of other specified B group vitamins: Secondary | ICD-10-CM | POA: Diagnosis not present

## 2019-04-22 DIAGNOSIS — R05 Cough: Secondary | ICD-10-CM | POA: Diagnosis not present

## 2019-04-27 DIAGNOSIS — I5189 Other ill-defined heart diseases: Secondary | ICD-10-CM | POA: Diagnosis not present

## 2019-04-27 DIAGNOSIS — U071 COVID-19: Secondary | ICD-10-CM | POA: Diagnosis not present

## 2019-04-27 DIAGNOSIS — E119 Type 2 diabetes mellitus without complications: Secondary | ICD-10-CM | POA: Diagnosis not present

## 2019-04-27 DIAGNOSIS — N3000 Acute cystitis without hematuria: Secondary | ICD-10-CM | POA: Diagnosis not present

## 2019-04-27 DIAGNOSIS — E440 Moderate protein-calorie malnutrition: Secondary | ICD-10-CM | POA: Diagnosis not present

## 2019-05-04 DIAGNOSIS — I480 Paroxysmal atrial fibrillation: Secondary | ICD-10-CM | POA: Diagnosis not present

## 2019-05-04 DIAGNOSIS — U071 COVID-19: Secondary | ICD-10-CM | POA: Diagnosis not present

## 2019-05-04 DIAGNOSIS — I5189 Other ill-defined heart diseases: Secondary | ICD-10-CM | POA: Diagnosis not present

## 2019-05-04 DIAGNOSIS — R05 Cough: Secondary | ICD-10-CM | POA: Diagnosis not present

## 2019-05-04 DIAGNOSIS — E119 Type 2 diabetes mellitus without complications: Secondary | ICD-10-CM | POA: Diagnosis not present

## 2019-05-10 DIAGNOSIS — M6281 Muscle weakness (generalized): Secondary | ICD-10-CM | POA: Diagnosis not present

## 2019-05-10 DIAGNOSIS — R2681 Unsteadiness on feet: Secondary | ICD-10-CM | POA: Diagnosis not present

## 2019-05-10 DIAGNOSIS — I480 Paroxysmal atrial fibrillation: Secondary | ICD-10-CM | POA: Diagnosis not present

## 2019-05-10 DIAGNOSIS — R2689 Other abnormalities of gait and mobility: Secondary | ICD-10-CM | POA: Diagnosis not present

## 2019-05-10 DIAGNOSIS — R41841 Cognitive communication deficit: Secondary | ICD-10-CM | POA: Diagnosis not present

## 2019-05-11 DIAGNOSIS — F6089 Other specific personality disorders: Secondary | ICD-10-CM | POA: Diagnosis not present

## 2019-05-11 DIAGNOSIS — N390 Urinary tract infection, site not specified: Secondary | ICD-10-CM | POA: Diagnosis not present

## 2019-05-11 DIAGNOSIS — E118 Type 2 diabetes mellitus with unspecified complications: Secondary | ICD-10-CM | POA: Diagnosis not present

## 2019-05-11 DIAGNOSIS — I480 Paroxysmal atrial fibrillation: Secondary | ICD-10-CM | POA: Diagnosis not present

## 2019-05-11 DIAGNOSIS — R2681 Unsteadiness on feet: Secondary | ICD-10-CM | POA: Diagnosis not present

## 2019-05-11 DIAGNOSIS — M6281 Muscle weakness (generalized): Secondary | ICD-10-CM | POA: Diagnosis not present

## 2019-05-11 DIAGNOSIS — E440 Moderate protein-calorie malnutrition: Secondary | ICD-10-CM | POA: Diagnosis not present

## 2019-05-11 DIAGNOSIS — R41841 Cognitive communication deficit: Secondary | ICD-10-CM | POA: Diagnosis not present

## 2019-05-11 DIAGNOSIS — D539 Nutritional anemia, unspecified: Secondary | ICD-10-CM | POA: Diagnosis not present

## 2019-05-11 DIAGNOSIS — R5381 Other malaise: Secondary | ICD-10-CM | POA: Diagnosis not present

## 2019-05-11 DIAGNOSIS — K5901 Slow transit constipation: Secondary | ICD-10-CM | POA: Diagnosis not present

## 2019-05-11 DIAGNOSIS — R0989 Other specified symptoms and signs involving the circulatory and respiratory systems: Secondary | ICD-10-CM | POA: Diagnosis not present

## 2019-05-11 DIAGNOSIS — R2689 Other abnormalities of gait and mobility: Secondary | ICD-10-CM | POA: Diagnosis not present

## 2019-05-12 DIAGNOSIS — R2681 Unsteadiness on feet: Secondary | ICD-10-CM | POA: Diagnosis not present

## 2019-05-12 DIAGNOSIS — M6281 Muscle weakness (generalized): Secondary | ICD-10-CM | POA: Diagnosis not present

## 2019-05-12 DIAGNOSIS — R41841 Cognitive communication deficit: Secondary | ICD-10-CM | POA: Diagnosis not present

## 2019-05-12 DIAGNOSIS — R2689 Other abnormalities of gait and mobility: Secondary | ICD-10-CM | POA: Diagnosis not present

## 2019-05-12 DIAGNOSIS — Z20828 Contact with and (suspected) exposure to other viral communicable diseases: Secondary | ICD-10-CM | POA: Diagnosis not present

## 2019-05-12 DIAGNOSIS — I480 Paroxysmal atrial fibrillation: Secondary | ICD-10-CM | POA: Diagnosis not present

## 2019-05-13 DIAGNOSIS — R41841 Cognitive communication deficit: Secondary | ICD-10-CM | POA: Diagnosis not present

## 2019-05-13 DIAGNOSIS — I480 Paroxysmal atrial fibrillation: Secondary | ICD-10-CM | POA: Diagnosis not present

## 2019-05-13 DIAGNOSIS — R2681 Unsteadiness on feet: Secondary | ICD-10-CM | POA: Diagnosis not present

## 2019-05-13 DIAGNOSIS — M6281 Muscle weakness (generalized): Secondary | ICD-10-CM | POA: Diagnosis not present

## 2019-05-13 DIAGNOSIS — R2689 Other abnormalities of gait and mobility: Secondary | ICD-10-CM | POA: Diagnosis not present

## 2019-05-14 DIAGNOSIS — R2681 Unsteadiness on feet: Secondary | ICD-10-CM | POA: Diagnosis not present

## 2019-05-14 DIAGNOSIS — I480 Paroxysmal atrial fibrillation: Secondary | ICD-10-CM | POA: Diagnosis not present

## 2019-05-14 DIAGNOSIS — R2689 Other abnormalities of gait and mobility: Secondary | ICD-10-CM | POA: Diagnosis not present

## 2019-05-14 DIAGNOSIS — R41841 Cognitive communication deficit: Secondary | ICD-10-CM | POA: Diagnosis not present

## 2019-05-14 DIAGNOSIS — M6281 Muscle weakness (generalized): Secondary | ICD-10-CM | POA: Diagnosis not present

## 2019-05-17 DIAGNOSIS — R41841 Cognitive communication deficit: Secondary | ICD-10-CM | POA: Diagnosis not present

## 2019-05-17 DIAGNOSIS — R2681 Unsteadiness on feet: Secondary | ICD-10-CM | POA: Diagnosis not present

## 2019-05-17 DIAGNOSIS — M6281 Muscle weakness (generalized): Secondary | ICD-10-CM | POA: Diagnosis not present

## 2019-05-17 DIAGNOSIS — I480 Paroxysmal atrial fibrillation: Secondary | ICD-10-CM | POA: Diagnosis not present

## 2019-05-17 DIAGNOSIS — R2689 Other abnormalities of gait and mobility: Secondary | ICD-10-CM | POA: Diagnosis not present

## 2019-05-18 DIAGNOSIS — M6281 Muscle weakness (generalized): Secondary | ICD-10-CM | POA: Diagnosis not present

## 2019-05-18 DIAGNOSIS — R2689 Other abnormalities of gait and mobility: Secondary | ICD-10-CM | POA: Diagnosis not present

## 2019-05-18 DIAGNOSIS — I480 Paroxysmal atrial fibrillation: Secondary | ICD-10-CM | POA: Diagnosis not present

## 2019-05-18 DIAGNOSIS — U071 COVID-19: Secondary | ICD-10-CM | POA: Diagnosis not present

## 2019-05-18 DIAGNOSIS — R2681 Unsteadiness on feet: Secondary | ICD-10-CM | POA: Diagnosis not present

## 2019-05-18 DIAGNOSIS — R41841 Cognitive communication deficit: Secondary | ICD-10-CM | POA: Diagnosis not present

## 2019-05-19 DIAGNOSIS — R41841 Cognitive communication deficit: Secondary | ICD-10-CM | POA: Diagnosis not present

## 2019-05-19 DIAGNOSIS — M6281 Muscle weakness (generalized): Secondary | ICD-10-CM | POA: Diagnosis not present

## 2019-05-19 DIAGNOSIS — I480 Paroxysmal atrial fibrillation: Secondary | ICD-10-CM | POA: Diagnosis not present

## 2019-05-19 DIAGNOSIS — R2681 Unsteadiness on feet: Secondary | ICD-10-CM | POA: Diagnosis not present

## 2019-05-19 DIAGNOSIS — R2689 Other abnormalities of gait and mobility: Secondary | ICD-10-CM | POA: Diagnosis not present

## 2019-05-20 DIAGNOSIS — R2681 Unsteadiness on feet: Secondary | ICD-10-CM | POA: Diagnosis not present

## 2019-05-20 DIAGNOSIS — R41841 Cognitive communication deficit: Secondary | ICD-10-CM | POA: Diagnosis not present

## 2019-05-20 DIAGNOSIS — I480 Paroxysmal atrial fibrillation: Secondary | ICD-10-CM | POA: Diagnosis not present

## 2019-05-20 DIAGNOSIS — M6281 Muscle weakness (generalized): Secondary | ICD-10-CM | POA: Diagnosis not present

## 2019-05-20 DIAGNOSIS — R2689 Other abnormalities of gait and mobility: Secondary | ICD-10-CM | POA: Diagnosis not present

## 2019-05-21 DIAGNOSIS — R41841 Cognitive communication deficit: Secondary | ICD-10-CM | POA: Diagnosis not present

## 2019-05-21 DIAGNOSIS — R2689 Other abnormalities of gait and mobility: Secondary | ICD-10-CM | POA: Diagnosis not present

## 2019-05-21 DIAGNOSIS — R2681 Unsteadiness on feet: Secondary | ICD-10-CM | POA: Diagnosis not present

## 2019-05-21 DIAGNOSIS — I480 Paroxysmal atrial fibrillation: Secondary | ICD-10-CM | POA: Diagnosis not present

## 2019-05-21 DIAGNOSIS — M6281 Muscle weakness (generalized): Secondary | ICD-10-CM | POA: Diagnosis not present

## 2019-05-24 DIAGNOSIS — R41841 Cognitive communication deficit: Secondary | ICD-10-CM | POA: Diagnosis not present

## 2019-05-24 DIAGNOSIS — I480 Paroxysmal atrial fibrillation: Secondary | ICD-10-CM | POA: Diagnosis not present

## 2019-05-24 DIAGNOSIS — I5022 Chronic systolic (congestive) heart failure: Secondary | ICD-10-CM | POA: Diagnosis not present

## 2019-05-24 DIAGNOSIS — M6281 Muscle weakness (generalized): Secondary | ICD-10-CM | POA: Diagnosis not present

## 2019-05-24 DIAGNOSIS — R2681 Unsteadiness on feet: Secondary | ICD-10-CM | POA: Diagnosis not present

## 2019-05-24 DIAGNOSIS — R2689 Other abnormalities of gait and mobility: Secondary | ICD-10-CM | POA: Diagnosis not present

## 2019-05-25 DIAGNOSIS — R41841 Cognitive communication deficit: Secondary | ICD-10-CM | POA: Diagnosis not present

## 2019-05-25 DIAGNOSIS — U071 COVID-19: Secondary | ICD-10-CM | POA: Diagnosis not present

## 2019-05-25 DIAGNOSIS — I480 Paroxysmal atrial fibrillation: Secondary | ICD-10-CM | POA: Diagnosis not present

## 2019-05-25 DIAGNOSIS — M6281 Muscle weakness (generalized): Secondary | ICD-10-CM | POA: Diagnosis not present

## 2019-05-25 DIAGNOSIS — R2681 Unsteadiness on feet: Secondary | ICD-10-CM | POA: Diagnosis not present

## 2019-05-25 DIAGNOSIS — I5022 Chronic systolic (congestive) heart failure: Secondary | ICD-10-CM | POA: Diagnosis not present

## 2019-05-25 DIAGNOSIS — R2689 Other abnormalities of gait and mobility: Secondary | ICD-10-CM | POA: Diagnosis not present

## 2019-05-26 DIAGNOSIS — M6281 Muscle weakness (generalized): Secondary | ICD-10-CM | POA: Diagnosis not present

## 2019-05-26 DIAGNOSIS — I480 Paroxysmal atrial fibrillation: Secondary | ICD-10-CM | POA: Diagnosis not present

## 2019-05-26 DIAGNOSIS — R2689 Other abnormalities of gait and mobility: Secondary | ICD-10-CM | POA: Diagnosis not present

## 2019-05-26 DIAGNOSIS — R2681 Unsteadiness on feet: Secondary | ICD-10-CM | POA: Diagnosis not present

## 2019-05-26 DIAGNOSIS — R41841 Cognitive communication deficit: Secondary | ICD-10-CM | POA: Diagnosis not present

## 2019-05-26 DIAGNOSIS — I5022 Chronic systolic (congestive) heart failure: Secondary | ICD-10-CM | POA: Diagnosis not present

## 2019-05-27 DIAGNOSIS — E119 Type 2 diabetes mellitus without complications: Secondary | ICD-10-CM | POA: Diagnosis not present

## 2019-05-27 DIAGNOSIS — I480 Paroxysmal atrial fibrillation: Secondary | ICD-10-CM | POA: Diagnosis not present

## 2019-05-27 DIAGNOSIS — R2681 Unsteadiness on feet: Secondary | ICD-10-CM | POA: Diagnosis not present

## 2019-05-27 DIAGNOSIS — R2689 Other abnormalities of gait and mobility: Secondary | ICD-10-CM | POA: Diagnosis not present

## 2019-05-27 DIAGNOSIS — I5189 Other ill-defined heart diseases: Secondary | ICD-10-CM | POA: Diagnosis not present

## 2019-05-27 DIAGNOSIS — D538 Other specified nutritional anemias: Secondary | ICD-10-CM | POA: Diagnosis not present

## 2019-05-27 DIAGNOSIS — I5022 Chronic systolic (congestive) heart failure: Secondary | ICD-10-CM | POA: Diagnosis not present

## 2019-05-27 DIAGNOSIS — I129 Hypertensive chronic kidney disease with stage 1 through stage 4 chronic kidney disease, or unspecified chronic kidney disease: Secondary | ICD-10-CM | POA: Diagnosis not present

## 2019-05-27 DIAGNOSIS — E440 Moderate protein-calorie malnutrition: Secondary | ICD-10-CM | POA: Diagnosis not present

## 2019-05-27 DIAGNOSIS — M6281 Muscle weakness (generalized): Secondary | ICD-10-CM | POA: Diagnosis not present

## 2019-05-27 DIAGNOSIS — R05 Cough: Secondary | ICD-10-CM | POA: Diagnosis not present

## 2019-05-27 DIAGNOSIS — R41841 Cognitive communication deficit: Secondary | ICD-10-CM | POA: Diagnosis not present

## 2019-05-27 DIAGNOSIS — N178 Other acute kidney failure: Secondary | ICD-10-CM | POA: Diagnosis not present

## 2019-06-01 DIAGNOSIS — U071 COVID-19: Secondary | ICD-10-CM | POA: Diagnosis not present

## 2019-06-04 DIAGNOSIS — D6489 Other specified anemias: Secondary | ICD-10-CM | POA: Diagnosis not present

## 2019-06-04 DIAGNOSIS — D508 Other iron deficiency anemias: Secondary | ICD-10-CM | POA: Diagnosis not present

## 2019-06-04 DIAGNOSIS — Z79899 Other long term (current) drug therapy: Secondary | ICD-10-CM | POA: Diagnosis not present

## 2019-06-07 DIAGNOSIS — U071 COVID-19: Secondary | ICD-10-CM | POA: Diagnosis not present

## 2019-06-07 DIAGNOSIS — N178 Other acute kidney failure: Secondary | ICD-10-CM | POA: Diagnosis not present

## 2019-06-07 DIAGNOSIS — E860 Dehydration: Secondary | ICD-10-CM | POA: Diagnosis not present

## 2019-06-07 DIAGNOSIS — N138 Other obstructive and reflux uropathy: Secondary | ICD-10-CM | POA: Diagnosis not present

## 2019-06-09 DIAGNOSIS — I9589 Other hypotension: Secondary | ICD-10-CM | POA: Diagnosis not present

## 2019-06-09 DIAGNOSIS — R05 Cough: Secondary | ICD-10-CM | POA: Diagnosis not present

## 2019-06-09 DIAGNOSIS — Z7189 Other specified counseling: Secondary | ICD-10-CM | POA: Diagnosis not present

## 2019-06-09 DIAGNOSIS — Z79899 Other long term (current) drug therapy: Secondary | ICD-10-CM | POA: Diagnosis not present

## 2019-06-09 DIAGNOSIS — N390 Urinary tract infection, site not specified: Secondary | ICD-10-CM | POA: Diagnosis not present

## 2019-06-10 ENCOUNTER — Emergency Department (HOSPITAL_COMMUNITY): Payer: Medicare HMO

## 2019-06-10 ENCOUNTER — Encounter (HOSPITAL_COMMUNITY): Payer: Self-pay

## 2019-06-10 ENCOUNTER — Other Ambulatory Visit: Payer: Self-pay

## 2019-06-10 ENCOUNTER — Other Ambulatory Visit (HOSPITAL_COMMUNITY): Payer: PRIVATE HEALTH INSURANCE

## 2019-06-10 ENCOUNTER — Inpatient Hospital Stay (HOSPITAL_COMMUNITY)
Admission: EM | Admit: 2019-06-10 | Discharge: 2019-06-14 | DRG: 871 | Disposition: A | Discharge status: Skilled Nursing Facility | Payer: Medicare HMO | Source: Skilled Nursing Facility | Attending: Internal Medicine | Admitting: Internal Medicine

## 2019-06-10 DIAGNOSIS — Z8261 Family history of arthritis: Secondary | ICD-10-CM

## 2019-06-10 DIAGNOSIS — Z20828 Contact with and (suspected) exposure to other viral communicable diseases: Secondary | ICD-10-CM | POA: Diagnosis not present

## 2019-06-10 DIAGNOSIS — K264 Chronic or unspecified duodenal ulcer with hemorrhage: Secondary | ICD-10-CM | POA: Diagnosis present

## 2019-06-10 DIAGNOSIS — N400 Enlarged prostate without lower urinary tract symptoms: Secondary | ICD-10-CM | POA: Diagnosis not present

## 2019-06-10 DIAGNOSIS — Z794 Long term (current) use of insulin: Secondary | ICD-10-CM

## 2019-06-10 DIAGNOSIS — E872 Acidosis, unspecified: Secondary | ICD-10-CM | POA: Diagnosis present

## 2019-06-10 DIAGNOSIS — N1831 Chronic kidney disease, stage 3a: Secondary | ICD-10-CM

## 2019-06-10 DIAGNOSIS — J690 Pneumonitis due to inhalation of food and vomit: Secondary | ICD-10-CM | POA: Diagnosis not present

## 2019-06-10 DIAGNOSIS — I959 Hypotension, unspecified: Secondary | ICD-10-CM | POA: Diagnosis present

## 2019-06-10 DIAGNOSIS — Z8719 Personal history of other diseases of the digestive system: Secondary | ICD-10-CM

## 2019-06-10 DIAGNOSIS — E1165 Type 2 diabetes mellitus with hyperglycemia: Secondary | ICD-10-CM | POA: Diagnosis not present

## 2019-06-10 DIAGNOSIS — R634 Abnormal weight loss: Secondary | ICD-10-CM | POA: Diagnosis not present

## 2019-06-10 DIAGNOSIS — D62 Acute posthemorrhagic anemia: Secondary | ICD-10-CM | POA: Diagnosis present

## 2019-06-10 DIAGNOSIS — N179 Acute kidney failure, unspecified: Secondary | ICD-10-CM | POA: Diagnosis not present

## 2019-06-10 DIAGNOSIS — N1832 Chronic kidney disease, stage 3b: Secondary | ICD-10-CM | POA: Diagnosis present

## 2019-06-10 DIAGNOSIS — R64 Cachexia: Secondary | ICD-10-CM | POA: Diagnosis not present

## 2019-06-10 DIAGNOSIS — G912 (Idiopathic) normal pressure hydrocephalus: Secondary | ICD-10-CM | POA: Diagnosis present

## 2019-06-10 DIAGNOSIS — R9431 Abnormal electrocardiogram [ECG] [EKG]: Secondary | ICD-10-CM | POA: Diagnosis not present

## 2019-06-10 DIAGNOSIS — I4821 Permanent atrial fibrillation: Secondary | ICD-10-CM | POA: Diagnosis not present

## 2019-06-10 DIAGNOSIS — D539 Nutritional anemia, unspecified: Secondary | ICD-10-CM | POA: Diagnosis present

## 2019-06-10 DIAGNOSIS — R112 Nausea with vomiting, unspecified: Secondary | ICD-10-CM | POA: Diagnosis not present

## 2019-06-10 DIAGNOSIS — F329 Major depressive disorder, single episode, unspecified: Secondary | ICD-10-CM | POA: Diagnosis not present

## 2019-06-10 DIAGNOSIS — R0600 Dyspnea, unspecified: Secondary | ICD-10-CM

## 2019-06-10 DIAGNOSIS — Z515 Encounter for palliative care: Secondary | ICD-10-CM | POA: Diagnosis not present

## 2019-06-10 DIAGNOSIS — R159 Full incontinence of feces: Secondary | ICD-10-CM | POA: Diagnosis present

## 2019-06-10 DIAGNOSIS — Z66 Do not resuscitate: Secondary | ICD-10-CM | POA: Diagnosis present

## 2019-06-10 DIAGNOSIS — L89152 Pressure ulcer of sacral region, stage 2: Secondary | ICD-10-CM | POA: Diagnosis not present

## 2019-06-10 DIAGNOSIS — Z79899 Other long term (current) drug therapy: Secondary | ICD-10-CM

## 2019-06-10 DIAGNOSIS — I129 Hypertensive chronic kidney disease with stage 1 through stage 4 chronic kidney disease, or unspecified chronic kidney disease: Secondary | ICD-10-CM | POA: Diagnosis not present

## 2019-06-10 DIAGNOSIS — R627 Adult failure to thrive: Secondary | ICD-10-CM | POA: Diagnosis not present

## 2019-06-10 DIAGNOSIS — K449 Diaphragmatic hernia without obstruction or gangrene: Secondary | ICD-10-CM | POA: Diagnosis not present

## 2019-06-10 DIAGNOSIS — A419 Sepsis, unspecified organism: Principal | ICD-10-CM | POA: Diagnosis present

## 2019-06-10 DIAGNOSIS — I482 Chronic atrial fibrillation, unspecified: Secondary | ICD-10-CM | POA: Diagnosis not present

## 2019-06-10 DIAGNOSIS — Z7189 Other specified counseling: Secondary | ICD-10-CM | POA: Diagnosis not present

## 2019-06-10 DIAGNOSIS — K920 Hematemesis: Secondary | ICD-10-CM | POA: Diagnosis not present

## 2019-06-10 DIAGNOSIS — N183 Chronic kidney disease, stage 3 unspecified: Secondary | ICD-10-CM | POA: Diagnosis not present

## 2019-06-10 DIAGNOSIS — E1122 Type 2 diabetes mellitus with diabetic chronic kidney disease: Secondary | ICD-10-CM | POA: Diagnosis not present

## 2019-06-10 DIAGNOSIS — Z6820 Body mass index (BMI) 20.0-20.9, adult: Secondary | ICD-10-CM

## 2019-06-10 DIAGNOSIS — E43 Unspecified severe protein-calorie malnutrition: Secondary | ICD-10-CM | POA: Diagnosis present

## 2019-06-10 DIAGNOSIS — N200 Calculus of kidney: Secondary | ICD-10-CM | POA: Diagnosis not present

## 2019-06-10 DIAGNOSIS — K861 Other chronic pancreatitis: Secondary | ICD-10-CM | POA: Diagnosis present

## 2019-06-10 DIAGNOSIS — E860 Dehydration: Secondary | ICD-10-CM

## 2019-06-10 DIAGNOSIS — Z982 Presence of cerebrospinal fluid drainage device: Secondary | ICD-10-CM

## 2019-06-10 DIAGNOSIS — Z87891 Personal history of nicotine dependence: Secondary | ICD-10-CM

## 2019-06-10 DIAGNOSIS — E559 Vitamin D deficiency, unspecified: Secondary | ICD-10-CM | POA: Diagnosis not present

## 2019-06-10 DIAGNOSIS — F418 Other specified anxiety disorders: Secondary | ICD-10-CM | POA: Diagnosis not present

## 2019-06-10 DIAGNOSIS — I4581 Long QT syndrome: Secondary | ICD-10-CM | POA: Diagnosis present

## 2019-06-10 DIAGNOSIS — K2210 Ulcer of esophagus without bleeding: Secondary | ICD-10-CM | POA: Diagnosis not present

## 2019-06-10 DIAGNOSIS — K922 Gastrointestinal hemorrhage, unspecified: Secondary | ICD-10-CM | POA: Diagnosis present

## 2019-06-10 DIAGNOSIS — R54 Age-related physical debility: Secondary | ICD-10-CM | POA: Diagnosis present

## 2019-06-10 DIAGNOSIS — R05 Cough: Secondary | ICD-10-CM | POA: Diagnosis not present

## 2019-06-10 DIAGNOSIS — K222 Esophageal obstruction: Secondary | ICD-10-CM | POA: Diagnosis present

## 2019-06-10 DIAGNOSIS — Z7982 Long term (current) use of aspirin: Secondary | ICD-10-CM

## 2019-06-10 DIAGNOSIS — Z9181 History of falling: Secondary | ICD-10-CM

## 2019-06-10 DIAGNOSIS — J984 Other disorders of lung: Secondary | ICD-10-CM | POA: Diagnosis not present

## 2019-06-10 DIAGNOSIS — K209 Esophagitis, unspecified without bleeding: Secondary | ICD-10-CM | POA: Diagnosis not present

## 2019-06-10 DIAGNOSIS — Z8551 Personal history of malignant neoplasm of bladder: Secondary | ICD-10-CM

## 2019-06-10 DIAGNOSIS — I499 Cardiac arrhythmia, unspecified: Secondary | ICD-10-CM | POA: Diagnosis not present

## 2019-06-10 DIAGNOSIS — Z7401 Bed confinement status: Secondary | ICD-10-CM | POA: Diagnosis not present

## 2019-06-10 DIAGNOSIS — R000 Tachycardia, unspecified: Secondary | ICD-10-CM | POA: Diagnosis not present

## 2019-06-10 DIAGNOSIS — Z993 Dependence on wheelchair: Secondary | ICD-10-CM

## 2019-06-10 DIAGNOSIS — D72829 Elevated white blood cell count, unspecified: Secondary | ICD-10-CM | POA: Diagnosis not present

## 2019-06-10 DIAGNOSIS — E785 Hyperlipidemia, unspecified: Secondary | ICD-10-CM | POA: Diagnosis not present

## 2019-06-10 DIAGNOSIS — Z8669 Personal history of other diseases of the nervous system and sense organs: Secondary | ICD-10-CM | POA: Diagnosis not present

## 2019-06-10 DIAGNOSIS — R0902 Hypoxemia: Secondary | ICD-10-CM | POA: Diagnosis present

## 2019-06-10 DIAGNOSIS — K859 Acute pancreatitis without necrosis or infection, unspecified: Secondary | ICD-10-CM | POA: Diagnosis present

## 2019-06-10 DIAGNOSIS — R4182 Altered mental status, unspecified: Secondary | ICD-10-CM | POA: Diagnosis not present

## 2019-06-10 DIAGNOSIS — K8681 Exocrine pancreatic insufficiency: Secondary | ICD-10-CM | POA: Diagnosis present

## 2019-06-10 DIAGNOSIS — D500 Iron deficiency anemia secondary to blood loss (chronic): Secondary | ICD-10-CM | POA: Diagnosis not present

## 2019-06-10 DIAGNOSIS — M2550 Pain in unspecified joint: Secondary | ICD-10-CM | POA: Diagnosis not present

## 2019-06-10 DIAGNOSIS — K269 Duodenal ulcer, unspecified as acute or chronic, without hemorrhage or perforation: Secondary | ICD-10-CM | POA: Diagnosis not present

## 2019-06-10 DIAGNOSIS — R0602 Shortness of breath: Secondary | ICD-10-CM | POA: Diagnosis not present

## 2019-06-10 DIAGNOSIS — I4891 Unspecified atrial fibrillation: Secondary | ICD-10-CM | POA: Diagnosis not present

## 2019-06-10 DIAGNOSIS — N189 Chronic kidney disease, unspecified: Secondary | ICD-10-CM | POA: Diagnosis present

## 2019-06-10 LAB — CBC
HCT: 25.2 % — ABNORMAL LOW (ref 39.0–52.0)
Hemoglobin: 8.1 g/dL — ABNORMAL LOW (ref 13.0–17.0)
MCH: 35.4 pg — ABNORMAL HIGH (ref 26.0–34.0)
MCHC: 32.1 g/dL (ref 30.0–36.0)
MCV: 110 fL — ABNORMAL HIGH (ref 80.0–100.0)
Platelets: 243 K/uL (ref 150–400)
RBC: 2.29 MIL/uL — ABNORMAL LOW (ref 4.22–5.81)
RDW: 15 % (ref 11.5–15.5)
WBC: 18.2 K/uL — ABNORMAL HIGH (ref 4.0–10.5)
nRBC: 0 % (ref 0.0–0.2)

## 2019-06-10 LAB — COMPREHENSIVE METABOLIC PANEL
ALT: 34 U/L (ref 0–44)
AST: 22 U/L (ref 15–41)
Albumin: 2.9 g/dL — ABNORMAL LOW (ref 3.5–5.0)
Alkaline Phosphatase: 77 U/L (ref 38–126)
BUN: 105 mg/dL — ABNORMAL HIGH (ref 8–23)
CO2: <7 mmol/L — ABNORMAL LOW (ref 22–32)
Calcium: 7.6 mg/dL — ABNORMAL LOW (ref 8.9–10.3)
Chloride: 117 mmol/L — ABNORMAL HIGH (ref 98–111)
Creatinine, Ser: 4.01 mg/dL — ABNORMAL HIGH (ref 0.61–1.24)
GFR calc Af Amer: 14 mL/min — ABNORMAL LOW (ref >60)
GFR calc non Af Amer: 12 mL/min — ABNORMAL LOW (ref >60)
Glucose, Bld: 215 mg/dL — ABNORMAL HIGH (ref 70–99)
Potassium: 4.5 mmol/L (ref 3.5–5.1)
Sodium: 143 mmol/L (ref 135–145)
Total Bilirubin: 1 mg/dL (ref 0.3–1.2)
Total Protein: 5.3 g/dL — ABNORMAL LOW (ref 6.5–8.1)

## 2019-06-10 LAB — BASIC METABOLIC PANEL
Anion gap: 15 (ref 5–15)
BUN: 99 mg/dL — ABNORMAL HIGH (ref 8–23)
CO2: 7 mmol/L — ABNORMAL LOW (ref 22–32)
Calcium: 7 mg/dL — ABNORMAL LOW (ref 8.9–10.3)
Chloride: 122 mmol/L — ABNORMAL HIGH (ref 98–111)
Creatinine, Ser: 3.7 mg/dL — ABNORMAL HIGH (ref 0.61–1.24)
GFR calc Af Amer: 16 mL/min — ABNORMAL LOW (ref >60)
GFR calc non Af Amer: 14 mL/min — ABNORMAL LOW (ref >60)
Glucose, Bld: 151 mg/dL — ABNORMAL HIGH (ref 70–99)
Potassium: 4.3 mmol/L (ref 3.5–5.1)
Sodium: 144 mmol/L (ref 135–145)

## 2019-06-10 LAB — PROTIME-INR
INR: 1.7 — ABNORMAL HIGH (ref 0.8–1.2)
Prothrombin Time: 19.8 seconds — ABNORMAL HIGH (ref 11.4–15.2)

## 2019-06-10 LAB — URINALYSIS, ROUTINE W REFLEX MICROSCOPIC
Bilirubin Urine: NEGATIVE
Glucose, UA: NEGATIVE mg/dL
Ketones, ur: NEGATIVE mg/dL
Nitrite: POSITIVE — AB
Protein, ur: 100 mg/dL — AB
Specific Gravity, Urine: 1.011 (ref 1.005–1.030)
WBC, UA: >50 WBC/hpf — ABNORMAL HIGH (ref 0–5)
pH: 8 (ref 5.0–8.0)

## 2019-06-10 LAB — HEMOGLOBIN AND HEMATOCRIT, BLOOD
HCT: 23.9 % — ABNORMAL LOW (ref 39.0–52.0)
HCT: 23.9 % — ABNORMAL LOW (ref 39.0–52.0)
HCT: 21.7 % — ABNORMAL LOW (ref 39.0–52.0)
Hemoglobin: 7.8 g/dL — ABNORMAL LOW (ref 13.0–17.0)
Hemoglobin: 7.9 g/dL — ABNORMAL LOW (ref 13.0–17.0)
Hemoglobin: 7.3 g/dL — ABNORMAL LOW (ref 13.0–17.0)

## 2019-06-10 LAB — LIPASE, BLOOD: Lipase: 14 U/L (ref 11–51)

## 2019-06-10 LAB — LACTIC ACID, PLASMA
Lactic Acid, Venous: 2.5 mmol/L (ref 0.5–1.9)
Lactic Acid, Venous: 1.9 mmol/L (ref 0.5–1.9)
Lactic Acid, Venous: 1.6 mmol/L (ref 0.5–1.9)

## 2019-06-10 LAB — MAGNESIUM: Magnesium: 1.7 mg/dL (ref 1.7–2.4)

## 2019-06-10 LAB — PREPARE RBC (CROSSMATCH)

## 2019-06-10 LAB — ABO/RH: ABO/RH(D): O POS

## 2019-06-10 LAB — SARS CORONAVIRUS 2 (TAT 6-24 HRS): SARS Coronavirus 2: NEGATIVE

## 2019-06-10 LAB — POC OCCULT BLOOD, ED: Fecal Occult Bld: POSITIVE — AB

## 2019-06-10 LAB — APTT: aPTT: 41 seconds — ABNORMAL HIGH (ref 24–36)

## 2019-06-10 LAB — CBG MONITORING, ED: Glucose-Capillary: 130 mg/dL — ABNORMAL HIGH (ref 70–99)

## 2019-06-10 LAB — HEMOGLOBIN A1C
Hgb A1c MFr Bld: 6.5 % — ABNORMAL HIGH (ref 4.8–5.6)
Mean Plasma Glucose: 139.85 mg/dL

## 2019-06-10 MED ORDER — MAGNESIUM SULFATE 2 GM/50ML IV SOLN
2.0000 g | Freq: Once | INTRAVENOUS | Status: AC
  Administered 2019-06-10: 2 g via INTRAVENOUS
  Filled 2019-06-10: qty 50

## 2019-06-10 MED ORDER — ACETAMINOPHEN 325 MG PO TABS: 650.0000 mg | ORAL_TABLET | Freq: Four times a day (QID) | ORAL | Status: DC | PRN

## 2019-06-10 MED ORDER — PANTOPRAZOLE SODIUM 40 MG IV SOLR
40.0000 mg | Freq: Two times a day (BID) | INTRAVENOUS | Status: DC
  Administered 2019-06-10 – 2019-06-11 (×4): 40 mg via INTRAVENOUS
  Filled 2019-06-10 (×4): qty 40

## 2019-06-10 MED ORDER — SODIUM CHLORIDE 0.9% IV SOLUTION
Freq: Once | INTRAVENOUS | Status: AC
  Administered 2019-06-10: 11:00:00 via INTRAVENOUS

## 2019-06-10 MED ORDER — ACETAMINOPHEN 650 MG RE SUPP: 650.0000 mg | Freq: Four times a day (QID) | RECTAL | Status: DC | PRN

## 2019-06-10 MED ORDER — SODIUM CHLORIDE 0.9% FLUSH
3.0000 mL | Freq: Once | INTRAVENOUS | Status: AC
  Administered 2019-06-10: 3 mL via INTRAVENOUS

## 2019-06-10 MED ORDER — SODIUM CHLORIDE 0.9 % IV SOLN
3.0000 g | Freq: Once | INTRAVENOUS | Status: AC
  Administered 2019-06-10: 08:00:00 3 g via INTRAVENOUS
  Filled 2019-06-10: qty 8

## 2019-06-10 MED ORDER — ONDANSETRON HCL 4 MG PO TABS: 4.0000 mg | ORAL_TABLET | Freq: Four times a day (QID) | ORAL | Status: DC | PRN

## 2019-06-10 MED ORDER — SODIUM CHLORIDE 0.9 % IV SOLN
3.0000 g | INTRAVENOUS | Status: DC
  Administered 2019-06-11 – 2019-06-12 (×2): 3 g via INTRAVENOUS
  Filled 2019-06-10: qty 3
  Filled 2019-06-10 (×2): qty 8

## 2019-06-10 MED ORDER — SODIUM CHLORIDE 0.9 % IV BOLUS (SEPSIS)
500.0000 mL | Freq: Once | INTRAVENOUS | Status: AC
  Administered 2019-06-10: 500 mL via INTRAVENOUS

## 2019-06-10 MED ORDER — INSULIN ASPART 100 UNIT/ML ~~LOC~~ SOLN
0.0000 [IU] | Freq: Three times a day (TID) | SUBCUTANEOUS | Status: DC
  Administered 2019-06-10: 1 [IU] via SUBCUTANEOUS

## 2019-06-10 MED ORDER — SODIUM CHLORIDE 0.9 % IV BOLUS
500.0000 mL | Freq: Once | INTRAVENOUS | Status: AC
  Administered 2019-06-10: 500 mL via INTRAVENOUS

## 2019-06-10 MED ORDER — SODIUM CHLORIDE 0.9 % IV BOLUS (SEPSIS)
250.0000 mL | Freq: Once | INTRAVENOUS | Status: AC
  Administered 2019-06-10: 250 mL via INTRAVENOUS

## 2019-06-10 MED ORDER — ALBUTEROL SULFATE (2.5 MG/3ML) 0.083% IN NEBU
2.5000 mg | INHALATION_SOLUTION | Freq: Four times a day (QID) | RESPIRATORY_TRACT | Status: DC | PRN
  Filled 2019-06-10: qty 3

## 2019-06-10 MED ORDER — SODIUM CHLORIDE 0.9 % IV BOLUS
250.0000 mL | Freq: Once | INTRAVENOUS | Status: AC
  Administered 2019-06-10: 250 mL via INTRAVENOUS

## 2019-06-10 MED ORDER — METOPROLOL TARTRATE 5 MG/5ML IV SOLN
2.5000 mg | INTRAVENOUS | Status: DC | PRN
  Filled 2019-06-10: qty 5

## 2019-06-10 MED ORDER — SODIUM CHLORIDE 0.9 % IV BOLUS (SEPSIS)
1000.0000 mL | Freq: Once | INTRAVENOUS | Status: AC
  Administered 2019-06-10: 07:00:00 1000 mL via INTRAVENOUS

## 2019-06-10 MED ORDER — ONDANSETRON HCL 4 MG/2ML IJ SOLN: 4.0000 mg | Freq: Four times a day (QID) | INTRAMUSCULAR | Status: DC | PRN

## 2019-06-10 MED ORDER — STERILE WATER FOR INJECTION IV SOLN
INTRAVENOUS | Status: DC
  Administered 2019-06-10 – 2019-06-11 (×3): via INTRAVENOUS
  Filled 2019-06-10 (×4): qty 850

## 2019-06-10 NOTE — ED Triage Notes (Addendum)
Patient arrived by EMS from [facility] for a chief complaint of emesis. Nursing home reported he had coffee-ground emesis, patient and EMS stated his emesis was dark brown in color but no coffee ground texture. 74 systolic BP was palpated by EMS. 18 gauge in the left [facility] initiated by EMS. Possibility of aspiration evidenced by crackles in breath sounds and cough.

## 2019-06-10 NOTE — H&P (Signed)
History and Physical    Craig Whitehead [id number] DOB: 1927-11-11 DOA: 06/10/2019  Referring MD/NP/PA: Gerlene Fee, MD PCP: Josetta Huddle, MD  Patient coming from: Nursing facility via EMS  Chief Complaint: Vomiting  I have personally briefly reviewed patient's old medical records in [place]   HPI: Craig Whitehead is a 85 y.o. male with medical history significant of chronic atrial fibrillation not on anticoagulation, DM type II, NPH s/p VP shunt, GERD, BPH, and bladder cancer s/p resection.  He presents after having several episodes of vomiting blood.  At baseline patient is bedbound.  Patient had been admitted back in August of this year to the hospital for dehydration.  He reports having at least 5 episodes of emesis with coffee-ground appearing fluid.  Denies being on any blood thinners, abdominal pain, chest pain, visible blood in stools, fever, or chills.  Reports that his last bowel movement was yesterday.  Other associated symptoms include weight loss of approximately 70 pounds over the last year and chronic loose stools.  Patient notes that his last colonoscopy was 2 to 4 years ago and he has history of polyps.  Patient   ED Course: Upon admission into the emergency department patient was noted to be tachycardic and tachypneic.  Labs significant for WBC 18.2, hemoglobin 8.1(baseline 7-8), CO2<7, BUN 105, creatinine 4.01, glucose 215, calcium 7.6, and lactic acid 2.5.  Fecal occult testing was positive for blood.  CT scan showed large stool burden, chronic calcified pancreatitis, and small hiatal hernia.  Patient was ordered 1.75 L of normal saline IV fluids and started on Unasyn.  Gastroenterology to be consulted by the ED provider.  COVID-19 screening was negative.  Review of Systems  Constitutional: Positive for malaise/fatigue and weight loss. Negative for fever.  HENT: Negative for ear discharge and nosebleeds.   Eyes: Negative for photophobia and discharge.    Respiratory: Negative for cough and shortness of breath.   Cardiovascular: Negative for chest pain and leg swelling.  Gastrointestinal: Positive for diarrhea, nausea and vomiting. Negative for abdominal pain.  Genitourinary: Negative for dysuria and hematuria.  Musculoskeletal: Negative for falls.  Neurological: Negative for focal weakness and loss of consciousness.  Psychiatric/Behavioral: Negative for memory loss and substance abuse.    Past Medical History:  Diagnosis Date   Anxiety    Atrial fibrillation (HCC)    Bladder cancer (HCC)    BPH (benign prostatic hypertrophy)    Diabetes mellitus ([place])    Diarrhea    Difficult intubation    ED (erectile dysfunction)    GERD (gastroesophageal reflux disease)    Hypercholesteremia    Hypertension    NPH (normal pressure hydrocephalus) ([place])    Seborrheic dermatitis    Vitamin D deficiency     Past Surgical History:  Procedure Laterality Date   BLADDER SURGERY     x 4 for bladder cancer   BRAIN SURGERY     CATARACT EXTRACTION Bilateral    INGUINAL HERNIA REPAIR Bilateral    TRANSTHORACIC ECHOCARDIOGRAM  05/2016   [facility]: EF 45-50%. Mildly reduced function (likely related to A. fib). Marketed LA dilation. Moderate RA dilation. Aortic sclerosis without stenosis. Mild-moderate TR. Mild-moderate pulmonary tension. Small pericardial effusion versus pericardial fat   VENTRICULOPERITONEAL SHUNT  2002     reports that he quit smoking about 24 years ago. He has never used smokeless tobacco. He reports current alcohol use of about 1.0 standard drinks of alcohol per week. He reports that he  does not use drugs.  No Known Allergies  Family History  Problem Relation Age of Onset   Rheum arthritis Mother    Cancer Father     Prior to Admission medications   Medication Sig Start Date End Date Taking? Authorizing Provider  aspirin EC 81 MG tablet Take 1 tablet (81 mg total) by mouth daily.  03/04/19   Hosie Poisson, MD  cephALEXin (KEFLEX) 500 MG capsule Take 1 capsule (500 mg total) by mouth every 12 (twelve) hours. 03/05/19   Hosie Poisson, MD  cholestyramine (QUESTRAN) 4 g packet Take 1 packet (4 g total) by mouth daily. 08/06/16   Ladene Artist, MD  feeding supplement, ENSURE ENLIVE, (ENSURE ENLIVE) LIQD Take 237 mLs by mouth daily. 03/05/19   Hosie Poisson, MD  feeding supplement, GLUCERNA SHAKE, (GLUCERNA SHAKE) LIQD Take 237 mLs by mouth 3 (three) times daily between meals. 04/01/17   Eugenie Filler, MD  folic acid (FOLVITE) 1 MG tablet Take 1 tablet (1 mg total) by mouth daily. 04/02/17   Eugenie Filler, MD  insulin aspart (NOVOLOG) 100 UNIT/ML injection CBG 70 - 120: 0 units CBG 121 - 150: 1 unit CBG 151 - 200: 2 units CBG 201 - 250: 3 units CBG 251 - 300: 5 units CBG 301 - 350: 7 units CBG 351 - 400: 9 units 03/04/19   Hosie Poisson, MD  insulin glargine (LANTUS) 100 UNIT/ML injection Inject 0.14 mLs (14 Units total) into the skin daily. 03/04/19   Hosie Poisson, MD  sodium bicarbonate 650 MG tablet Take 1 tablet (650 mg total) by mouth daily. 03/05/19   Hosie Poisson, MD  tamsulosin (FLOMAX) 0.4 MG CAPS capsule Take 1 capsule (0.4 mg total) by mouth daily. 04/02/17   Eugenie Filler, MD  thiamine 100 MG tablet Take 1 tablet (100 mg total) by mouth daily. 04/02/17   Eugenie Filler, MD  Triamcinolone Acetonide (TRIAMCINOLONE 0.1 % CREAM : EUCERIN) CREA Apply 1 application topically 2 (two) times daily. 04/01/17   Eugenie Filler, MD  vitamin B-12 (CYANOCOBALAMIN) 500 MCG tablet Take 1 tablet (500 mcg total) by mouth daily. 03/05/19   Hosie Poisson, MD    Physical Exam:  Constitutional: Elderly male who appears chronically ill Vitals:   06/10/19 0900 06/10/19 0907 06/10/19 0915 06/10/19 0930  BP: 103/73  107/67 109/78  Pulse:      Resp: (!) 21  17   Temp:      TempSrc:      SpO2:      Weight:  53.1 kg    Height:  5\' 7"  (1.702 m)     Eyes: PERRL, lids  and conjunctivae normal ENMT: Mucous membranes are dry.  Posterior pharynx clear of any exudate or lesions. Poor dentition.  Neck: normal, supple, no masses, no thyromegaly Respiratory: clear to auscultation bilaterally, no wheezing, no crackles. Normal respiratory effort. No accessory muscle use.  Cardiovascular: Irregular irregular, no murmurs / rubs / gallops. No extremity edema. 2+ pedal pulses. No carotid bruits.   Abdomen: no tenderness, no masses palpated. No hepatosplenomegaly. Bowel sounds positive.  Musculoskeletal: no clubbing / cyanosis. No joint deformity upper and lower extremities.   Skin: no rashes, lesions, ulcers. No induration Neurologic: CN 2-12 grossly intact. Sensation intact, DTR normal. Strength 5/5 in all 4.  Psychiatric: Normal judgment and insight. Alert and oriented x 3. Normal mood.     Labs on Admission: I have personally reviewed following labs and imaging studies  CBC: Recent Labs  Lab 06/10/19 0550  WBC 18.2*  HGB 8.1*  HCT 25.2*  MCV 110.0*  PLT 0000000   Basic Metabolic Panel: Recent Labs  Lab 06/10/19 0550  NA 143  K 4.5  CL 117*  CO2 <7*  GLUCOSE 215*  BUN 105*  CREATININE 4.01*  CALCIUM 7.6*   GFR: Estimated Creatinine Clearance: 9.4 mL/min (A) (by C-G formula based on SCr of 4.01 mg/dL (H)). Liver Function Tests: Recent Labs  Lab 06/10/19 0550  AST 22  ALT 34  ALKPHOS 77  BILITOT 1.0  PROT 5.3*  ALBUMIN 2.9*   Recent Labs  Lab 06/10/19 0550  LIPASE 14   No results for input(s): AMMONIA in the last 168 hours. Coagulation Profile: No results for input(s): INR, PROTIME in the last 168 hours. Cardiac Enzymes: No results for input(s): CKTOTAL, CKMB, CKMBINDEX, TROPONINI in the last 168 hours. BNP (last 3 results) No results for input(s): PROBNP in the last 8760 hours. HbA1C: No results for input(s): HGBA1C in the last 72 hours. CBG: No results for input(s): GLUCAP in the last 168 hours. Lipid Profile: No results for  input(s): CHOL, HDL, LDLCALC, TRIG, CHOLHDL, LDLDIRECT in the last 72 hours. Thyroid Function Tests: No results for input(s): TSH, T4TOTAL, FREET4, T3FREE, THYROIDAB in the last 72 hours. Anemia Panel: No results for input(s): VITAMINB12, FOLATE, FERRITIN, TIBC, IRON, RETICCTPCT in the last 72 hours. Urine analysis:    Component Value Date/Time   COLORURINE YELLOW 02/28/2019 1900   APPEARANCEUR HAZY (A) 02/28/2019 1900   LABSPEC 1.009 02/28/2019 1900   PHURINE 7.0 02/28/2019 1900   GLUCOSEU NEGATIVE 02/28/2019 1900   HGBUR SMALL (A) 02/28/2019 1900   BILIRUBINUR NEGATIVE 02/28/2019 1900   KETONESUR NEGATIVE 02/28/2019 1900   PROTEINUR NEGATIVE 02/28/2019 1900   NITRITE POSITIVE (A) 02/28/2019 1900   LEUKOCYTESUR LARGE (A) 02/28/2019 1900   Sepsis Labs: No results found for this or any previous visit (from the past 240 hour(s)).   Radiological Exams on Admission: Ct Abdomen Pelvis Wo Contrast  Result Date: 06/10/2019 CLINICAL DATA:  Nausea and vomiting. EXAM: CT ABDOMEN AND PELVIS WITHOUT CONTRAST TECHNIQUE: Multidetector CT imaging of the abdomen and pelvis was performed following the standard protocol without IV contrast. COMPARISON:  None. FINDINGS: Lower chest: Focal bronchiectasis in both lower lobes. Heart size is normal. Hepatobiliary: No focal liver abnormality is seen. No gallstones, gallbladder wall thickening, or biliary dilatation. Pancreas: Diffuse pancreatic atrophy. Numerous calcifications throughout the pancreas consistent with chronic calcific pancreatitis. Spleen: Normal. Adrenals/Urinary Tract: The adrenal glands are normal. Multiple bilateral renal calculi. No hydronephrosis. 6.7 cm cyst on the lower pole of the left kidney. 3.2 cm cyst on the upper pole of the left kidney. Focal small stones in the dependent portion of the bladder. Stomach/Bowel: Large amount of stool in the distended rectum. Moderate stool throughout the remainder of the colon. Small hiatal hernia.  Small bowel appears normal. Appendix is not visualized. Vascular/Lymphatic: Aortic atherosclerosis. No enlarged abdominal or pelvic lymph nodes. Reproductive: Prostate is unremarkable. Other: Peritoneal catheter in the right side of the abdomen. Musculoskeletal: No acute or significant osseous findings. IMPRESSION: 1. Large amount of stool in the distended rectum with moderate stool throughout the remainder of the colon. 2. Chronic calcific pancreatitis. 3. Multiple bilateral renal calculi. 4. Aortic Atherosclerosis (ICD10-I70.0). Electronically Signed   By: Lorriane Shire M.D.   On: 06/10/2019 08:16   Dg Chest Port 1 View  Result Date: 06/10/2019 CLINICAL DATA:  Vomiting. Possible aspiration. EXAM: PORTABLE CHEST 1 VIEW  COMPARISON:  03/04/2019 FINDINGS: Stable upper normal heart size. Unchanged mediastinal contours with aortic atherosclerosis and tortuosity. Patchy bilateral perihilar airspace disease, new from prior with central bronchial thickening. No pulmonary edema, large pleural effusion or pneumothorax. Skin fold projects over the right lateral chest wall. No visualized pneumomediastinum. Shunt catheter tubing projects over the right hemithorax. Bones are under mineralized. IMPRESSION: Patchy bilateral perihilar airspace disease, new from prior exam, may be atelectasis or pneumonia, including aspiration. Electronically Signed   By: Keith Rake M.D.   On: 06/10/2019 06:12    EKG: Independently reviewed.  Suspect atrial fibrillation with lots of background artifact 127 bpm and QTC calculated at 506.  Assessment/Plan Hematemesis, GI bleed, macrocytic anemia: Acute.  Patient presents from nursing facility after reportedly vomiting blood.  Hemoglobin 8.1, but baseline previously around 7-8.  Patient noted to chronically have elevated MCV with low normal vitamin B12 levels of 288 on 03/04/2019.  Stool guaiacs were positive.  Patient notes that his last colonoscopy was -Admit to a stepdown  bed -Add-on PT/INR from previous labs obtained -Clear liquid diet as tolerated, n.p.o. after midnight -Serial H&H monitoring every 4 hours x3 -Protonix and antiemetics initially not ordered due to prolonged QT.   -GI recommended giving Protonix with monitoring of QTc.  Serial EKGs ordered.  Would discontinue Protonix if QTc appears to be worsening. -Transfuse blood products as needed for hemoglobin less than 7 or symptomatic -GI to be formally consulted, will follow up for further recommendation  Sepsis secondary to aspiration pneumonia: Patient was initially noted to be tachycardic, tachypneic, and hypotensive 84/65.  Labs were significant for WBC 18.2 and lactic acid 2.5.  Chest x-ray showing patchy bilateral perihilar airspace disease concerning for aspiration pneumonia. -Aspiration precaution  -Continue empiric antibiotics of Unasyn per pharmacy -Trend lactic acid levels  Acute renal failure superimposed on chronic kidney disease: Patient baseline creatinine noted to be around 1.8.  He presents with creatinine 4.01 with BUN 105.  Patient had been given 1.75 L of normal saline IV fluids.  Suspecting prerenal cause given elevated BUN to creatinine ratio. -Bladder scan for any signs of urinary retention -Sodium bicarb drip at 100 mL/h -Continue to monitor kidney function daily  Prolonged QT interval: Acute.  On admission QTc noted to be 506. -Serial monitoring of EKG as seen above -Held Zofran  Permanent atrial fibrillation: Chronic.  Patient presents in atrial fibrillation with heart rates into the 110s.  It appears he is not on any rate controlling medications and is not on any anticoagulation due to previous history of falls. -Goal magnesium 2 and potassium 4 -Replace electrolytes as needed -Metoprolol 2.5 mg IV as needed for elevated heart rates.  Metabolic acidosis with elevated anion gap: Acute.  On admission CO2 less than 7.  Anion gap calculated greater than >19 or >21.8 when  attributed for albumin.  Suspect secondary to acute renal failure with elevated BUN. -Continue to monitor   Diabetes mellitus type 2, with hyperglycemia:   Initial blood sugar elevated to 215. Last hemoglobin A1c noted to be 7.1 on 02/2019.  -Hypoglycemic protocols -CBGs before every meal with sensitive SSI -Restart home regimen when appropriate  Normal pressure hydrocephalus: Patient status post VP shunt and has not been ambulatory due to previous history of repeated falls.  History of bladder cancer: Patient reports that he underwent 4 surgeries to have bladder cancer resected.  Weight loss: Patient reports losing approximately 70 pounds over the last year.  Last TSH 1.164 on 02/25/2019.  DO NOT RESUSCITATE: Patient has MOST form is in at bedside that states DNR, but okay for IV fluids and antibiotics.   DVT prophylaxis: SCD Code Status: DNR, MOST form present Family Communication: Discussed plan of care with the patient's daughter over the phone. Disposition Plan: Likely discharge back to skilled nursing facility once medically stable Consults called: Gastroenterology Admission status: inpatient   Norval Morton MD [facility] Pager [phone]   If 7PM-7AM, please contact night-coverage [email or web address] Password [email or web address]  06/10/2019, 9:45 AM

## 2019-06-10 NOTE — ED Notes (Signed)
Patient's blood pressure presented below 90 systolic, bicarbonate was paused to avoid hypotension.

## 2019-06-10 NOTE — ED Notes (Signed)
Notified MD, Tamala Julian of patients bp 82/63 (71). Repeat bp 84/66 (72). Orders for normal saline bolus placed. See [facility]

## 2019-06-10 NOTE — Consult Note (Signed)
[facility] Consult  Referring Provider: Maudie Flakes, MD Primary Care Physician:  Josetta Huddle, MD Primary Gastroenterologist: [facility]  Reason for Consultation: Hematemesis  HPI: Craig Whitehead is a 85 y.o. male with past medical history of normal pressure hydrocephalus VP shunt, atrial fibrillation, protein calorie malnutrition, diabetes, BPH, atrial fibrillation on aspirin 81 mg, marked debility, wheelchair dependent was brought to the ED from nursing home today with complaints of multiple episodes of vomiting.  Patient states that he developed vomiting yesterday evening, and thinks he vomited what ever he ate.  He is not aware of vomiting bright red blood and as per his nurse in ED vomitus contained brown fluid.  Patient denies abdominal or rectal pain.  Normally he has frequent loose stools and fecal incontinence and is on cholestyramine, however reports no bowel movement today.  His hemoglobin was between 8.1-7.8(baseline hemoglobin is between 7.2-7.5), found to have worsening renal function with a BUN of 105/creatinine 4.01 and GFR of 12, elevated lactic acid, CT showed large amount of stool and distended rectum with moderate stool throughout the colon, chronic calcific pancreatitis and small hiatal hernia with normal small bowel.  Past Medical History:  Diagnosis Date  . Anxiety   . Atrial fibrillation ([place])   . Bladder cancer ([place])   . BPH (benign prostatic hypertrophy)   . Diabetes mellitus ([place])   . Diarrhea   . Difficult intubation   . ED (erectile dysfunction)   . GERD (gastroesophageal reflux disease)   . Hypercholesteremia   . Hypertension   . NPH (normal pressure hydrocephalus) ([place])   . Seborrheic dermatitis   . Vitamin D deficiency     Past Surgical History:  Procedure Laterality Date  . BLADDER SURGERY     x 4 for bladder cancer  . BRAIN SURGERY    . CATARACT EXTRACTION Bilateral   . INGUINAL HERNIA REPAIR Bilateral   . TRANSTHORACIC  ECHOCARDIOGRAM  05/2016   [facility], [facility], [facility]: EF 45-50%. Mildly reduced function (likely related to A. fib). Marketed LA dilation. Moderate RA dilation. Aortic sclerosis without stenosis. Mild-moderate TR. Mild-moderate pulmonary tension. Small pericardial effusion versus pericardial fat  . VENTRICULOPERITONEAL SHUNT  2002    Prior to Admission medications   Medication Sig Start Date End Date Taking? Authorizing Provider  aspirin EC 81 MG tablet Take 1 tablet (81 mg total) by mouth daily. 03/04/19  Yes Hosie Poisson, MD  cholestyramine (QUESTRAN) 4 g packet Take 1 packet (4 g total) by mouth daily. Patient taking differently: Take 4 g by mouth Every Tuesday,Thursday,and Saturday with dialysis.  08/06/16  Yes Ladene Artist, MD  docusate sodium (COLACE) 100 MG capsule Take 100 mg by mouth daily.   Yes [provider]  ferrous sulfate 325 (65 FE) MG tablet Take 325 mg by mouth daily with breakfast. 05/15/19 06/26/19 Yes [provider]  folic acid (FOLVITE) 1 MG tablet Take 1 tablet (1 mg total) by mouth daily. 04/02/17  Yes Eugenie Filler, MD  guaiFENesin (MUCINEX) 600 MG 12 hr tablet Take 600 mg by mouth 2 (two) times daily.   Yes [provider]  insulin aspart (NOVOLOG) 100 UNIT/ML injection CBG 70 - 120: 0 units CBG 121 - 150: 1 unit CBG 151 - 200: 2 units CBG 201 - 250: 3 units CBG 251 - 300: 5 units CBG 301 - 350: 7 units CBG 351 - 400: 9 units Patient taking differently: Inject 0-12 Units into the skin See admin instructions. Check  blood sugar twice daily. If CBG 70 - 200: 0 units CBG 201 - 250: 2 unit CBG 251 - 300: 4 units CBG 301 - 350: 6 units CBG 351 - 400: 8 units CBG 401 - 450: 10 units CBG 451 - 500: 12 units If blood sugar is greater than 500, call NP/PA 03/04/19  Yes Hosie Poisson, MD  insulin glargine (LANTUS) 100 UNIT/ML injection Inject 0.14 mLs (14 Units total) into the skin daily. Patient taking differently: Inject 12 Units  into the skin at bedtime.  03/04/19  Yes Hosie Poisson, MD  sodium bicarbonate 650 MG tablet Take 1 tablet (650 mg total) by mouth daily. 03/05/19  Yes Hosie Poisson, MD  thiamine 100 MG tablet Take 1 tablet (100 mg total) by mouth daily. 04/02/17  Yes Eugenie Filler, MD  Triamcinolone Acetonide (TRIAMCINOLONE 0.1 % CREAM : EUCERIN) CREA Apply 1 application topically 2 (two) times daily. 04/01/17  Yes Eugenie Filler, MD  vitamin B-12 (CYANOCOBALAMIN) 500 MCG tablet Take 1 tablet (500 mcg total) by mouth daily. 03/05/19  Yes Hosie Poisson, MD  feeding supplement, ENSURE ENLIVE, (ENSURE ENLIVE) LIQD Take 237 mLs by mouth daily. Patient not taking: Reported on 06/10/2019 03/05/19   Hosie Poisson, MD  feeding supplement, GLUCERNA SHAKE, (GLUCERNA SHAKE) LIQD Take 237 mLs by mouth 3 (three) times daily between meals. Patient not taking: Reported on 06/10/2019 04/01/17   Eugenie Filler, MD  tamsulosin (FLOMAX) 0.4 MG CAPS capsule Take 1 capsule (0.4 mg total) by mouth daily. Patient not taking: Reported on 06/10/2019 04/02/17   Eugenie Filler, MD    Current Facility-Administered Medications  Medication Dose Route Frequency Provider Last Rate Last Dose  . acetaminophen (TYLENOL) tablet 650 mg  650 mg Oral Q6H PRN Norval Morton, MD       Or  . acetaminophen (TYLENOL) suppository 650 mg  650 mg Rectal Q6H PRN Tamala Julian, Rondell A, MD      . albuterol (PROVENTIL) (2.5 MG/3ML) 0.083% nebulizer solution 2.5 mg  2.5 mg Nebulization Q6H PRN Norval Morton, MD      . Derrill Memo ON 06/11/2019] Ampicillin-Sulbactam (UNASYN) 3 g in sodium chloride 0.9 % 100 mL IVPB  3 g Intravenous Q24H Millen, Jessica B, RPH      . ondansetron (ZOFRAN) tablet 4 mg  4 mg Oral Q6H PRN Fuller Plan A, MD       Or  . ondansetron (ZOFRAN) injection 4 mg  4 mg Intravenous Q6H PRN Smith, Rondell A, MD      . pantoprazole (PROTONIX) injection 40 mg  40 mg Intravenous Q12H Ronnette Juniper, MD      . sodium bicarbonate 150 mEq in  sterile water 1,000 mL infusion   Intravenous Continuous Fuller Plan A, MD 100 mL/hr at 06/10/19 1219     Current Outpatient Medications  Medication Sig Dispense Refill  . aspirin EC 81 MG tablet Take 1 tablet (81 mg total) by mouth daily. 90 tablet 3  . cholestyramine (QUESTRAN) 4 g packet Take 1 packet (4 g total) by mouth daily. (Patient taking differently: Take 4 g by mouth Every Tuesday,Thursday,and Saturday with dialysis. ) 30 each 11  . docusate sodium (COLACE) 100 MG capsule Take 100 mg by mouth daily.    . ferrous sulfate 325 (65 FE) MG tablet Take 325 mg by mouth daily with breakfast.    . folic acid (FOLVITE) 1 MG tablet Take 1 tablet (1 mg total) by mouth daily.    Marland Kitchen  guaiFENesin (MUCINEX) 600 MG 12 hr tablet Take 600 mg by mouth 2 (two) times daily.    . insulin aspart (NOVOLOG) 100 UNIT/ML injection CBG 70 - 120: 0 units CBG 121 - 150: 1 unit CBG 151 - 200: 2 units CBG 201 - 250: 3 units CBG 251 - 300: 5 units CBG 301 - 350: 7 units CBG 351 - 400: 9 units (Patient taking differently: Inject 0-12 Units into the skin See admin instructions. Check blood sugar twice daily. If CBG 70 - 200: 0 units CBG 201 - 250: 2 unit CBG 251 - 300: 4 units CBG 301 - 350: 6 units CBG 351 - 400: 8 units CBG 401 - 450: 10 units CBG 451 - 500: 12 units If blood sugar is greater than 500, call NP/PA) 10 mL 0  . insulin glargine (LANTUS) 100 UNIT/ML injection Inject 0.14 mLs (14 Units total) into the skin daily. (Patient taking differently: Inject 12 Units into the skin at bedtime. ) 10 mL 0  . sodium bicarbonate 650 MG tablet Take 1 tablet (650 mg total) by mouth daily. 30 tablet 0  . thiamine 100 MG tablet Take 1 tablet (100 mg total) by mouth daily.    . Triamcinolone Acetonide (TRIAMCINOLONE 0.1 % CREAM : EUCERIN) CREA Apply 1 application topically 2 (two) times daily.    . vitamin B-12 (CYANOCOBALAMIN) 500 MCG tablet Take 1 tablet (500 mcg total) by mouth daily. 30 tablet 0  . feeding  supplement, ENSURE ENLIVE, (ENSURE ENLIVE) LIQD Take 237 mLs by mouth daily. (Patient not taking: Reported on 06/10/2019) 237 mL 12  . feeding supplement, GLUCERNA SHAKE, (GLUCERNA SHAKE) LIQD Take 237 mLs by mouth 3 (three) times daily between meals. (Patient not taking: Reported on 06/10/2019)  0  . tamsulosin (FLOMAX) 0.4 MG CAPS capsule Take 1 capsule (0.4 mg total) by mouth daily. (Patient not taking: Reported on 06/10/2019) 30 capsule 0    Allergies as of 06/10/2019  . (No Known Allergies)    Family History  Problem Relation Age of Onset  . Rheum arthritis Mother   . Cancer Father     Social History   Socioeconomic History  . Marital status: Divorced    Spouse name: Not on file  . Number of children: Not on file  . Years of education: Not on file  . Highest education level: Not on file  Occupational History    Comment: Partially retired Restaurant manager, fast food  Social Needs  . Financial resource strain: Not on file  . Food insecurity    Worry: Not on file    Inability: Not on file  . Transportation needs    Medical: Not on file    Non-medical: Not on file  Tobacco Use  . Smoking status: Former Smoker    Quit date: 07/06/1994    Years since quitting: 24.9  . Smokeless tobacco: Never Used  Substance and Sexual Activity  . Alcohol use: Yes    Alcohol/week: 1.0 standard drinks    Types: 1 Shots of liquor per week    Comment: 1 bourbon daily  . Drug use: No  . Sexual activity: Not on file  Lifestyle  . Physical activity    Days per week: Not on file    Minutes per session: Not on file  . Stress: Not on file  Relationships  . Social Herbalist on phone: Not on file    Gets together: Not on file    Attends religious service: Not  on file    Active member of club or organization: Not on file    Attends meetings of clubs or organizations: Not on file    Relationship status: Not on file  . Intimate partner violence    Fear of current or ex partner: Not on file     Emotionally abused: Not on file    Physically abused: Not on file    Forced sexual activity: Not on file  Other Topics Concern  . Not on file  Social History Narrative   He has a degree in chiropractic medicine. He practice [employer] and for years, then came back at a retirement because he enjoyed doing it.   he quit smoking >50 years ago.    He drinks 1 drink of bourbon a day.      Currently living in an assisted living facility following his discharge.  Is not sure where he will go after that.    Review of Systems: Positive for: GI: Described in detail in HPI.    Gen: Denies any fever, chills, rigors, night sweats, anorexia, fatigue, weakness, malaise, involuntary weight loss, and sleep disorder CV: Denies chest pain, angina, palpitations, syncope, orthopnea, PND, peripheral edema, and claudication. Resp: Denies dyspnea, cough, sputum, wheezing, coughing up blood. GU : Denies urinary burning, blood in urine, urinary frequency, urinary hesitancy, nocturnal urination, and urinary incontinence. MS: Lower extremity weakness, wheelchair-bound Derm: Denies rash, itching, oral ulcerations, hives, unhealing ulcers.  Psych: Denies depression, anxiety, memory loss, suicidal ideation, hallucinations,  and confusion. Heme: Denies bruising, bleeding, and enlarged lymph nodes. Neuro:  Denies any headaches, dizziness, paresthesias. Endo:  Denies any problems with DM, thyroid, adrenal function.  Physical Exam: Vital signs in last 24 hours: Temp:  [97.4 F (36.3 C)] 97.4 F (36.3 C) (11/19 0546) Pulse Rate:  [87-144] 125 (11/19 1045) Resp:  [15-25] 19 (11/19 1200) BP: (77-147)/(46-91) 97/61 (11/19 1200) SpO2:  [94 %-100 %] 94 % (11/19 1045) Weight:  [53.1 kg] 53.1 kg (11/19 0907)    General:   Frail, chronically ill-appearing, elderly gentleman Head:  Normocephalic and atraumatic. Eyes:  Sclera clear, no icterus.   Prominent pallor Ears:  Normal auditory acuity. Nose:  No deformity,  discharge,  or lesions. Mouth:  No deformity or lesions.  Oropharynx pink & moist. Neck:  Supple; no masses or thyromegaly. Lungs:  Clear throughout to auscultation.   No wheezes, crackles, or rhonchi. No acute distress. Heart:  Regular rate and rhythm; no murmurs, clicks, rubs,  or gallops. Extremities:  Without clubbing or edema. Neurologic:  Alert and  oriented x4;  grossly normal neurologically. Skin:  Intact without significant lesions or rashes. Psych:  Alert and cooperative. Normal mood and affect. Abdomen:  Soft, nontender and nondistended. No masses, hepatosplenomegaly or hernias noted. Normal bowel sounds, without guarding, and without rebound.         Lab Results: Recent Labs    06/10/19 0550 06/10/19 1005  WBC 18.2*  --   HGB 8.1* 7.8*  HCT 25.2* 23.9*  PLT 243  --    BMET Recent Labs    06/10/19 0550  NA 143  K 4.5  CL 117*  CO2 <7*  GLUCOSE 215*  BUN 105*  CREATININE 4.01*  CALCIUM 7.6*   LFT Recent Labs    06/10/19 0550  PROT 5.3*  ALBUMIN 2.9*  AST 22  ALT 34  ALKPHOS 77  BILITOT 1.0   PT/INR No results for input(s): LABPROT, INR in the last 72 hours.  Studies/Results:  Ct Abdomen Pelvis Wo Contrast  Result Date: 06/10/2019 CLINICAL DATA:  Nausea and vomiting. EXAM: CT ABDOMEN AND PELVIS WITHOUT CONTRAST TECHNIQUE: Multidetector CT imaging of the abdomen and pelvis was performed following the standard protocol without IV contrast. COMPARISON:  None. FINDINGS: Lower chest: Focal bronchiectasis in both lower lobes. Heart size is normal. Hepatobiliary: No focal liver abnormality is seen. No gallstones, gallbladder wall thickening, or biliary dilatation. Pancreas: Diffuse pancreatic atrophy. Numerous calcifications throughout the pancreas consistent with chronic calcific pancreatitis. Spleen: Normal. Adrenals/Urinary Tract: The adrenal glands are normal. Multiple bilateral renal calculi. No hydronephrosis. 6.7 cm cyst on the lower pole of the left  kidney. 3.2 cm cyst on the upper pole of the left kidney. Focal small stones in the dependent portion of the bladder. Stomach/Bowel: Large amount of stool in the distended rectum. Moderate stool throughout the remainder of the colon. Small hiatal hernia. Small bowel appears normal. Appendix is not visualized. Vascular/Lymphatic: Aortic atherosclerosis. No enlarged abdominal or pelvic lymph nodes. Reproductive: Prostate is unremarkable. Other: Peritoneal catheter in the right side of the abdomen. Musculoskeletal: No acute or significant osseous findings. IMPRESSION: 1. Large amount of stool in the distended rectum with moderate stool throughout the remainder of the colon. 2. Chronic calcific pancreatitis. 3. Multiple bilateral renal calculi. 4. Aortic Atherosclerosis (ICD10-I70.0). Electronically Signed   By: Lorriane Shire M.D.   On: 06/10/2019 08:16   Dg Chest Port 1 View  Result Date: 06/10/2019 CLINICAL DATA:  Vomiting. Possible aspiration. EXAM: PORTABLE CHEST 1 VIEW COMPARISON:  03/04/2019 FINDINGS: Stable upper normal heart size. Unchanged mediastinal contours with aortic atherosclerosis and tortuosity. Patchy bilateral perihilar airspace disease, new from prior with central bronchial thickening. No pulmonary edema, large pleural effusion or pneumothorax. Skin fold projects over the right lateral chest wall. No visualized pneumomediastinum. Shunt catheter tubing projects over the right hemithorax. Bones are under mineralized. IMPRESSION: Patchy bilateral perihilar airspace disease, new from prior exam, may be atelectasis or pneumonia, including aspiration. Electronically Signed   By: Keith Rake M.D.   On: 06/10/2019 06:12    Impression: Coffee-ground emesis/brown fluid emesis Chronic calcific pancreatitis Possible aspiration pneumonia on chest x-ray  Plan: Plan diagnostic EGD in a.m.. Start patient on Protonix 40 mg IV every 12 hours, transfuse PRBC if hemoglobin is less than 7 or patient  develops hemodynamic instability. Clear liquid diet if tolerated and n.p.o. post midnight. Patient has been started on IV Unasyn for suspected aspiration pneumonia.   LOS: 0 days   Ronnette Juniper, MD  06/10/2019, 12:34 PM

## 2019-06-10 NOTE — ED Notes (Signed)
Left ac IV site noted to be infiltrated. Notified pharmacy. Pharmacy to bedside. Cold compress placed to infiltration site on left arm. Patient reports no pain. IV removed.

## 2019-06-10 NOTE — ED Notes (Signed)
Requested antibiotics from pharmacy

## 2019-06-10 NOTE — ED Notes (Signed)
ED TO INPATIENT HANDOFF REPORT  ED Nurse Name and Phone #: Josimar Corning [phone]   S Name/Age/Gender Craig Whitehead 85 y.o. male Room/Bed: 014C/014C  Code Status   Code Status: DNR  Home/SNF/Other snf Patient oriented to: self, place and situation Is this baseline? Yes   Triage Complete: Triage complete  Chief Complaint emesis  Triage Note Patient arrived by EMS from [facility] for a chief complaint of emesis. Nursing home reported he had coffee-ground emesis, patient and EMS stated his emesis was dark brown in color but no coffee ground texture. 74 systolic BP was palpated by EMS. 18 gauge in the left [facility] initiated by EMS. Possibility of aspiration evidenced by crackles in breath sounds and cough.    Allergies No Known Allergies  Level of Care/Admitting Diagnosis ED Disposition    ED Disposition Condition Comment   Admit  Hospital Area: [place] [[id number]]  Level of Care: Progressive [102]  Admit to Progressive based on following criteria: GI, ENDOCRINE disease patients with GI bleeding, acute liver failure or pancreatitis, stable with diabetic ketoacidosis or thyrotoxicosis (hypothyroid) state.  Covid Evaluation: Asymptomatic Screening Protocol (No Symptoms)  Diagnosis: Hematemesis [578.0.ICD-9-CM]  Admitting Physician: Norval Morton [id number]  Attending Physician: Norval Morton [id number]  Estimated length of stay: past midnight tomorrow  Certification:: I certify this patient will need inpatient services for at least 2 midnights  PT Class (Do Not Modify): Inpatient [101]  PT Acc Code (Do Not Modify): Private [1]       B Medical/Surgery History Past Medical History:  Diagnosis Date  . Anxiety   . Atrial fibrillation ([place])   . Bladder cancer ([place])   . BPH (benign prostatic hypertrophy)   . Diabetes mellitus ([place])   . Diarrhea   . Difficult intubation   . ED (erectile dysfunction)   . GERD (gastroesophageal reflux disease)   .  Hypercholesteremia   . Hypertension   . NPH (normal pressure hydrocephalus) ([place])   . Seborrheic dermatitis   . Vitamin D deficiency    Past Surgical History:  Procedure Laterality Date  . BLADDER SURGERY     x 4 for bladder cancer  . BRAIN SURGERY    . CATARACT EXTRACTION Bilateral   . INGUINAL HERNIA REPAIR Bilateral   . TRANSTHORACIC ECHOCARDIOGRAM  05/2016   [facility]: EF 45-50%. Mildly reduced function (likely related to A. fib). Marketed LA dilation. Moderate RA dilation. Aortic sclerosis without stenosis. Mild-moderate TR. Mild-moderate pulmonary tension. Small pericardial effusion versus pericardial fat  . VENTRICULOPERITONEAL SHUNT  2002     A IV Location/Drains/Wounds Patient Lines/Drains/Airways Status   Active Line/Drains/Airways    Name:   Placement date:   Placement time:   Site:   Days:   Peripheral IV 06/10/19 Right Antecubital   06/10/19    0738    Antecubital   less than 1   External Urinary Catheter   02/26/19    1800    -   104   Pressure Injury 04/01/17 Stage II -  Partial thickness loss of dermis presenting as a shallow open ulcer with a red, pink wound bed without slough.   04/01/17    0830     800          Intake/Output Last 24 hours  Intake/Output Summary (Last 24 hours) at 06/10/2019 2116 Last data filed at 06/10/2019 1743 Gross per 24 hour  Intake 3153.78 ml  Output -  Net 3153.78 ml  Labs/Imaging Results for orders placed or performed during the hospital encounter of 06/10/19 (from the past 48 hour(s))  Lipase, blood     Status: None   Collection Time: 06/10/19  5:50 AM  Result Value Ref Range   Lipase 14 11 - 51 U/L    Comment: Performed at [facility], [place] [address] [place], [place] [address]  Comprehensive metabolic panel     Status: Abnormal   Collection Time: 06/10/19  5:50 AM  Result Value Ref Range   Sodium 143 135 - 145 mmol/L   Potassium 4.5 3.5 - 5.1 mmol/L   Chloride 117 (H) 98 - 111 mmol/L    CO2 <7 (L) 22 - 32 mmol/L   Glucose, Bld 215 (H) 70 - 99 mg/dL   BUN 105 (H) 8 - 23 mg/dL   Creatinine, Ser 4.01 (H) 0.61 - 1.24 mg/dL   Calcium 7.6 (L) 8.9 - 10.3 mg/dL   Total Protein 5.3 (L) 6.5 - 8.1 g/dL   Albumin 2.9 (L) 3.5 - 5.0 g/dL   AST 22 15 - 41 U/L   ALT 34 0 - 44 U/L   Alkaline Phosphatase 77 38 - 126 U/L   Total Bilirubin 1.0 0.3 - 1.2 mg/dL   GFR calc non Af Amer 12 (L) >60 mL/min   GFR calc Af Amer 14 (L) >60 mL/min    Comment: Performed at [facility] [address], [place], [place] [address]  CBC     Status: Abnormal   Collection Time: 06/10/19  5:50 AM  Result Value Ref Range   WBC 18.2 (H) 4.0 - 10.5 K/uL   RBC 2.29 (L) 4.22 - 5.81 MIL/uL   Hemoglobin 8.1 (L) 13.0 - 17.0 g/dL   HCT 25.2 (L) 39.0 - 52.0 %   MCV 110.0 (H) 80.0 - 100.0 fL   MCH 35.4 (H) 26.0 - 34.0 pg   MCHC 32.1 30.0 - 36.0 g/dL   RDW 15.0 11.5 - 15.5 %   Platelets 243 150 - 400 K/uL   nRBC 0.0 0.0 - 0.2 %    Comment: Performed at [facility], [place] [address], [place], [place] [address]  POC occult blood, ED     Status: Abnormal   Collection Time: 06/10/19  6:49 AM  Result Value Ref Range   Fecal Occult Bld POSITIVE (A) NEGATIVE  Lactic acid, plasma     Status: Abnormal   Collection Time: 06/10/19  7:03 AM  Result Value Ref Range   Lactic Acid, Venous 2.5 (HH) 0.5 - 1.9 mmol/L    Comment: CRITICAL RESULT CALLED TO, READ BACK BY AND VERIFIED WITH: H.VANKRETSCHMAR,RN [id number] 06/10/2019 CLARK,S Performed at [facility], [place] [address] [place], [place] [address]   Blood culture (routine x 2)     Status: None (Preliminary result)   Collection Time: 06/10/19  7:03 AM   Specimen: BLOOD  Result Value Ref Range   Specimen Description BLOOD SITE NOT SPECIFIED    Special Requests      BOTTLES DRAWN AEROBIC AND ANAEROBIC Blood Culture results may not be optimal due to an inadequate volume of blood received in culture bottles   Culture      NO GROWTH < 12 HOURS Performed at  [facility], [place] [address] [place],  [address]    Report Status PENDING   Blood culture (routine x 2)     Status: None (Preliminary result)   Collection Time: 06/10/19  7:03 AM   Specimen: BLOOD  Result Value Ref Range   Specimen Description BLOOD SITE NOT SPECIFIED    Special Requests      BOTTLES DRAWN AEROBIC AND ANAEROBIC Blood Culture results may not be optimal due to an inadequate volume of blood received in culture bottles   Culture      NO GROWTH < 12 HOURS Performed at [facility] [address] [place], [place] [address]    Report Status PENDING   Type and screen     Status: None   Collection Time: 06/10/19  7:03 AM  Result Value Ref Range   ABO/RH(D) O POS    Antibody Screen NEG    Sample Expiration      06/13/2019,2359 Performed at [facility], [place] [address], [place], [place] [address]   ABO/Rh     Status: None   Collection Time: 06/10/19  7:03 AM  Result Value Ref Range   ABO/RH(D)      O POS Performed at [facility] [address] [place], [place] [address]   SARS CORONAVIRUS 2 (TAT 6-24 HRS) Nasopharyngeal Nasopharyngeal Swab     Status: None   Collection Time: 06/10/19  9:04 AM   Specimen: Nasopharyngeal Swab  Result Value Ref Range   SARS Coronavirus 2 NEGATIVE NEGATIVE    Comment: (NOTE) SARS-CoV-2 target nucleic acids are NOT DETECTED. The SARS-CoV-2 RNA is generally detectable in upper and lower respiratory specimens during the acute phase of infection. Negative results do not preclude SARS-CoV-2 infection, do not rule out co-infections with other pathogens, and should not be used as the sole basis for treatment or other patient management decisions. Negative results must be combined with clinical observations, patient history, and epidemiological information. The expected result is Negative. Fact Sheet for Patients: SugarRoll.be Fact Sheet for Healthcare  Providers: https://www.woods-mathews.com/ This test is not yet approved or cleared by the [place] FDA and  has been authorized for detection and/or diagnosis of SARS-CoV-2 by FDA under an Emergency Use Authorization (EUA). This EUA will remain  in effect (meaning this test can be used) for the duration of the COVID-19 declaration under Section 56 4(b)(1) of the Act, 21 U.S.C. section 360bbb-3(b)(1), unless the authorization is terminated or revoked sooner. Performed at [facility], [place] [address] [place], [place] [address]   Hemoglobin and hematocrit, blood     Status: Abnormal   Collection Time: 06/10/19 10:05 AM  Result Value Ref Range   Hemoglobin 7.8 (L) 13.0 - 17.0 g/dL   HCT 23.9 (L) 39.0 - 52.0 %    Comment: Performed at [facility], [place] [address] [place], [place] [address]  Prepare RBC     Status: None   Collection Time: 06/10/19 10:05 AM  Result Value Ref Range   Order Confirmation      ORDER PROCESSED BY BLOOD BANK Performed at [facility], [place] [address], [place],  [address]   Magnesium     Status: None   Collection Time: 06/10/19 10:05 AM  Result Value Ref Range   Magnesium 1.7 1.7 - 2.4 mg/dL    Comment: Performed at [facility] [address], [place], [place] [address]  Lactic acid, plasma     Status: None   Collection Time: 06/10/19  2:04 PM  Result Value Ref Range   Lactic Acid, Venous 1.9 0.5 - 1.9 mmol/L    Comment: Performed at [facility], [address], [place], [place] [address]  Urinalysis, Routine w reflex microscopic     Status: Abnormal   Collection Time: 06/10/19  2:49 PM  Result Value Ref Range   Color, Urine YELLOW YELLOW   APPearance CLOUDY (A) CLEAR   Specific Gravity, Urine 1.011 1.005 - 1.030   pH 8.0 5.0 - 8.0   Glucose, UA NEGATIVE NEGATIVE mg/dL   Hgb urine dipstick MODERATE (A) NEGATIVE   Bilirubin Urine NEGATIVE NEGATIVE   Ketones, ur NEGATIVE NEGATIVE mg/dL   Protein,  ur 100 (A) NEGATIVE mg/dL   Nitrite POSITIVE (A) NEGATIVE   Leukocytes,Ua LARGE (A) NEGATIVE   RBC / HPF 21-50 0 - 5 RBC/hpf   WBC, UA >50 (H) 0 - 5 WBC/hpf   Bacteria, UA FEW (A) NONE SEEN   Squamous Epithelial / LPF 0-5 0 - 5   WBC Clumps PRESENT     Comment: Performed at [facility], [place] [address] [place], [place] [address]  Hemoglobin and hematocrit, blood     Status: Abnormal   Collection Time: 06/10/19  3:00 PM  Result Value Ref Range   Hemoglobin 7.9 (L) 13.0 - 17.0 g/dL   HCT 23.9 (L) 39.0 - 52.0 %    Comment: Performed at [facility] [address] [place], [place] [id number]  Basic metabolic panel     Status: Abnormal   Collection Time: 06/10/19  3:00 PM  Result Value Ref Range   Sodium 144 135 - 145 mmol/L   Potassium 4.3 3.5 - 5.1 mmol/L   Chloride 122 (H) 98 - 111 mmol/L   CO2 7 (L) 22 - 32 mmol/L   Glucose, Bld 151 (H) 70 - 99 mg/dL   BUN 99 (H) 8 - 23 mg/dL   Creatinine, Ser 3.70 (H) 0.61 - 1.24 mg/dL   Calcium 7.0 (L) 8.9 - 10.3 mg/dL   GFR calc non Af Amer 14 (L) >60 mL/min   GFR calc Af Amer 16 (L) >60 mL/min   Anion gap 15 5 - 15    Comment: Performed at [facility] [address] [place], [place] [address]  Protime-INR     Status: Abnormal   Collection Time: 06/10/19  3:00 PM  Result Value Ref Range   Prothrombin Time 19.8 (H) 11.4 - 15.2 seconds   INR 1.7 (H) 0.8 - 1.2    Comment: (NOTE) INR goal varies based on device and disease states. Performed at [facility], [place] [address], [place], [place] [address]   APTT     Status: Abnormal   Collection Time: 06/10/19  3:00 PM  Result Value Ref Range   aPTT 41 (H) 24 - 36 seconds    Comment:        IF BASELINE aPTT IS ELEVATED, SUGGEST PATIENT RISK ASSESSMENT BE USED TO DETERMINE APPROPRIATE ANTICOAGULANT THERAPY. Performed at [facility], [place] [address] [place], [place] [address]   Hemoglobin A1c     Status: Abnormal   Collection Time: 06/10/19  3:00 PM   Result Value Ref Range   Hgb A1c MFr Bld 6.5 (H) 4.8 - 5.6 %    Comment: (NOTE) Pre diabetes:          5.7%-6.4% Diabetes:              >6.4% Glycemic control for   <7.0% adults with diabetes    Mean Plasma Glucose 139.85 mg/dL    Comment: Performed at [facility], [place] [address] [place], [place] [address]  CBG monitoring, ED     Status: Abnormal   Collection Time: 06/10/19  5:37 PM  Result Value Ref Range   Glucose-Capillary 130 (H) 70 - 99 mg/dL  Hemoglobin and hematocrit, blood     Status: Abnormal   Collection Time: 06/10/19  5:50 PM  Result Value Ref Range   Hemoglobin 7.3 (L) 13.0 - 17.0 g/dL   HCT 21.7 (L) 39.0 - 52.0 %    Comment: Performed at [facility] [address] [place], [place] [address]  Lactic acid, plasma     Status: None   Collection Time: 06/10/19  5:50 PM  Result Value Ref Range   Lactic Acid, Venous 1.6 0.5 - 1.9 mmol/L    Comment: Performed at [facility] [address], [place], [place] [address]   Ct Abdomen Pelvis Wo Contrast  Result Date: 06/10/2019 CLINICAL DATA:  Nausea and vomiting. EXAM: CT ABDOMEN AND PELVIS WITHOUT CONTRAST TECHNIQUE: Multidetector CT imaging of the abdomen and pelvis was performed following the standard protocol without IV contrast. COMPARISON:  None. FINDINGS: Lower chest: Focal bronchiectasis in both lower lobes. Heart size is normal. Hepatobiliary: No focal liver abnormality is seen. No gallstones, gallbladder wall thickening, or biliary dilatation. Pancreas: Diffuse pancreatic atrophy. Numerous calcifications throughout the pancreas consistent with chronic calcific pancreatitis. Spleen: Normal. Adrenals/Urinary Tract: The adrenal glands are normal. Multiple bilateral renal calculi. No hydronephrosis. 6.7 cm cyst on the lower pole of the left kidney. 3.2 cm cyst on the upper pole of the left kidney. Focal small stones in the dependent portion of the bladder. Stomach/Bowel: Large amount of stool in the  distended rectum. Moderate stool throughout the remainder of the colon. Small hiatal hernia. Small bowel appears normal. Appendix is not visualized. Vascular/Lymphatic: Aortic atherosclerosis. No enlarged abdominal or pelvic lymph nodes. Reproductive: Prostate is unremarkable. Other: Peritoneal catheter in the right side of the abdomen. Musculoskeletal: No acute or significant osseous findings. IMPRESSION: 1. Large amount of stool in the distended rectum with moderate stool throughout the remainder of the colon. 2. Chronic calcific pancreatitis. 3. Multiple bilateral renal calculi. 4. Aortic Atherosclerosis (ICD10-I70.0). Electronically Signed   By: Lorriane Shire M.D.   On: 06/10/2019 08:16   Dg Chest Port 1 View  Result Date: 06/10/2019 CLINICAL DATA:  Vomiting. Possible aspiration. EXAM: PORTABLE CHEST 1 VIEW COMPARISON:  03/04/2019 FINDINGS: Stable upper normal heart size. Unchanged mediastinal contours with aortic atherosclerosis and tortuosity. Patchy bilateral perihilar airspace disease, new from prior with central bronchial thickening. No pulmonary edema, large pleural effusion or pneumothorax. Skin fold projects over the right lateral chest wall. No visualized pneumomediastinum. Shunt catheter tubing projects over the right hemithorax. Bones are under mineralized. IMPRESSION: Patchy bilateral perihilar airspace disease, new from prior exam, may be atelectasis or pneumonia, including aspiration. Electronically Signed   By: Keith Rake M.D.   On: 06/10/2019 06:12    Pending Labs Unresulted Labs (From admission, onward)    Start     Ordered   06/11/19 0500  CBC  Tomorrow morning,   R     06/10/19 1001   06/11/19 [id number]  Basic metabolic panel  Tomorrow morning,   R     06/10/19 1001   06/10/19 0641  Lactic acid, plasma  Now then every 2 hours,   STAT     06/10/19 0640          Vitals/Pain Today's Vitals   06/10/19  1900 06/10/19 2000 06/10/19 2030 06/10/19 2110  BP: (!) 95/59 113/71  102/75 (!) 101/55  Pulse: 95   (!) 110  Resp: (!) 22 (!) 22 20 20   Temp:    97.8 F (36.6 C)  TempSrc:    Oral  SpO2: 98%   100%  Weight:      Height:      PainSc:        Isolation Precautions No active isolations  Medications Medications  sodium bicarbonate 150 mEq in sterile water 1,000 mL infusion ( Intravenous Restarted 06/10/19 1219)  acetaminophen (TYLENOL) tablet 650 mg (has no administration in time range)    Or  acetaminophen (TYLENOL) suppository 650 mg (has no administration in time range)  ondansetron (ZOFRAN) tablet 4 mg (has no administration in time range)    Or  ondansetron (ZOFRAN) injection 4 mg (has no administration in time range)  albuterol (PROVENTIL) (2.5 MG/3ML) 0.083% nebulizer solution 2.5 mg (has no administration in time range)  Ampicillin-Sulbactam (UNASYN) 3 g in sodium chloride 0.9 % 100 mL IVPB (has no administration in time range)  pantoprazole (PROTONIX) injection 40 mg (40 mg Intravenous Given 06/10/19 1309)  metoprolol tartrate (LOPRESSOR) injection 2.5 mg (has no administration in time range)  insulin aspart (novoLOG) injection 0-9 Units (1 Units Subcutaneous Given 06/10/19 1855)  sodium chloride flush (NS) 0.9 % injection 3 mL (3 mLs Intravenous Given 06/10/19 0553)  Ampicillin-Sulbactam (UNASYN) 3 g in sodium chloride 0.9 % 100 mL IVPB (0 g Intravenous Stopped 06/10/19 0906)  sodium chloride 0.9 % bolus 1,000 mL (0 mLs Intravenous Stopped 06/10/19 0951)    And  sodium chloride 0.9 % bolus 500 mL (0 mLs Intravenous Stopped 06/10/19 0807)    And  sodium chloride 0.9 % bolus 250 mL (0 mLs Intravenous Stopped 06/10/19 0800)  0.9 %  sodium chloride infusion (Manually program via Guardrails IV Fluids) ( Intravenous Stopped 06/10/19 1220)  magnesium sulfate IVPB 2 g 50 mL (0 g Intravenous Stopped 06/10/19 1728)  sodium chloride 0.9 % bolus 250 mL (0 mLs Intravenous Stopped 06/10/19 1743)    Mobility manual wheelchair Low fall risk   Focused  Assessments gi/gu   R Recommendations: See Admitting Provider Note  Report given to:   Additional Notes:

## 2019-06-10 NOTE — Progress Notes (Signed)
Pharmacy Antibiotic Note  TAHEIM FITT is a 85 y.o. male admitted on 06/10/2019 with aspiration pneumonia.  Pharmacy has been consulted for Unasyn dosing.  Unasyn 3g given at 07:49 AM. WBC is elevated at 18.2. Temp 97.4.  Patient has acute kidney injury with SCr level up to 4.01 (baseline is about 1.8 to 2).   Plan: Unasyn 3g IV every 24 hours - next dose due 11/20 at 8AM.  Monitor renal function and adjust dosing as needed.   Height: 5\' 7"  (170.2 cm) Weight: 117 lb 1 oz (53.1 kg) IBW/kg (Calculated) : 66.1  Temp (24hrs), Avg:97.4 F (36.3 C), Min:97.4 F (36.3 C), Max:97.4 F (36.3 C)  Recent Labs  Lab 06/10/19 0550 06/10/19 0703  WBC 18.2*  --   CREATININE 4.01*  --   LATICACIDVEN  --  2.5*    Estimated Creatinine Clearance: 9.4 mL/min (A) (by C-G formula based on SCr of 4.01 mg/dL (H)).    No Known Allergies  Antimicrobials this admission: Unasyn 11/19 >>  Dose adjustments this admission:   Microbiology results: 11/19 COVID >> 11/19 BCx >>  Thank you for allowing pharmacy to be a part of this patient's care.  Sloan Leiter, PharmD, BCPS, BCCCP Clinical Pharmacist Please refer to [facility] for [place] numbers 06/10/2019 10:08 AM

## 2019-06-10 NOTE — ED Provider Notes (Signed)
[facility] EMERGENCY DEPARTMENT Provider Note   CSN: [id number] Arrival date & time: 06/10/19  0541     History   Chief Complaint Chief Complaint  Patient presents with  . Emesis    HPI Craig Whitehead is a 85 y.o. male.     The history is provided by the patient.  Emesis Severity:  Moderate Timing:  Intermittent Progression:  Worsening Chronicity:  New Relieved by:  Nothing Worsened by:  Nothing Associated symptoms: cough   Associated symptoms: no abdominal pain, no diarrhea and no fever   Patient with multiple medical conditions including atrial fibrillation, diabetes, VP shunt for normal pressure hydrocephalus presents for nursing facility for vomiting.  Patient reports he has had up to 15 episodes of vomiting.  Per nursing home report it was coffee-ground emesis EMS reports the patient was hypotensive to 74 systolic on arrival. Patient denies any pain at this time.  He denies headache, chest pain, abdominal pain.  He does report cough  Past Medical History:  Diagnosis Date  . Anxiety   . Atrial fibrillation ([place])   . Bladder cancer ([place])   . BPH (benign prostatic hypertrophy)   . Diabetes mellitus ([place])   . Diarrhea   . Difficult intubation   . ED (erectile dysfunction)   . GERD (gastroesophageal reflux disease)   . Hypercholesteremia   . Hypertension   . NPH (normal pressure hydrocephalus) ([place])   . Seborrheic dermatitis   . Vitamin D deficiency     Patient Active Problem List   Diagnosis Date Noted  . Pressure injury of skin 04/01/2017  . Protein-calorie malnutrition, severe 03/29/2017  . Hyperglycemia due to type 2 diabetes mellitus ([place]) 03/29/2017  . Acute kidney injury superimposed on CKD ([place]) 03/29/2017  . Leukocytosis 03/29/2017  . NPH (normal pressure hydrocephalus) ([place]) 03/29/2017  . BPH (benign prostatic hyperplasia) 03/29/2017  . History of bladder cancer 03/29/2017  . Homelessness 03/29/2017  . Weakness 03/28/2017   . Permanent atrial fibrillation ([place]): CHA2DS2Vasc 2 (age). Rate controlled without medications asymptomatic 06/02/2016  . Medication management 05/31/2016  . Major depressive disorder, recurrent severe without psychotic features ([place]) 05/13/2016    Past Surgical History:  Procedure Laterality Date  . BLADDER SURGERY     x 4 for bladder cancer  . BRAIN SURGERY    . CATARACT EXTRACTION Bilateral   . INGUINAL HERNIA REPAIR Bilateral   . TRANSTHORACIC ECHOCARDIOGRAM  05/2016   [facility]: EF 45-50%. Mildly reduced function (likely related to A. fib). Marketed LA dilation. Moderate RA dilation. Aortic sclerosis without stenosis. Mild-moderate TR. Mild-moderate pulmonary tension. Small pericardial effusion versus pericardial fat  . VENTRICULOPERITONEAL SHUNT  2002        Home Medications    Prior to Admission medications   Medication Sig Start Date End Date Taking? Authorizing Provider  aspirin EC 81 MG tablet Take 1 tablet (81 mg total) by mouth daily. 03/04/19   Hosie Poisson, MD  cephALEXin (KEFLEX) 500 MG capsule Take 1 capsule (500 mg total) by mouth every 12 (twelve) hours. 03/05/19   Hosie Poisson, MD  cholestyramine (QUESTRAN) 4 g packet Take 1 packet (4 g total) by mouth daily. 08/06/16   Ladene Artist, MD  feeding supplement, ENSURE ENLIVE, (ENSURE ENLIVE) LIQD Take 237 mLs by mouth daily. 03/05/19   Hosie Poisson, MD  feeding supplement, GLUCERNA SHAKE, (GLUCERNA SHAKE) LIQD Take 237 mLs by mouth 3 (three) times daily between meals. 04/01/17   Eugenie Filler,  MD  folic acid (FOLVITE) 1 MG tablet Take 1 tablet (1 mg total) by mouth daily. 04/02/17   Eugenie Filler, MD  insulin aspart (NOVOLOG) 100 UNIT/ML injection CBG 70 - 120: 0 units CBG 121 - 150: 1 unit CBG 151 - 200: 2 units CBG 201 - 250: 3 units CBG 251 - 300: 5 units CBG 301 - 350: 7 units CBG 351 - 400: 9 units 03/04/19   Hosie Poisson, MD  insulin glargine (LANTUS) 100 UNIT/ML injection  Inject 0.14 mLs (14 Units total) into the skin daily. 03/04/19   Hosie Poisson, MD  sodium bicarbonate 650 MG tablet Take 1 tablet (650 mg total) by mouth daily. 03/05/19   Hosie Poisson, MD  tamsulosin (FLOMAX) 0.4 MG CAPS capsule Take 1 capsule (0.4 mg total) by mouth daily. 04/02/17   Eugenie Filler, MD  thiamine 100 MG tablet Take 1 tablet (100 mg total) by mouth daily. 04/02/17   Eugenie Filler, MD  Triamcinolone Acetonide (TRIAMCINOLONE 0.1 % CREAM : EUCERIN) CREA Apply 1 application topically 2 (two) times daily. 04/01/17   Eugenie Filler, MD  vitamin B-12 (CYANOCOBALAMIN) 500 MCG tablet Take 1 tablet (500 mcg total) by mouth daily. 03/05/19   Hosie Poisson, MD    Family History Family History  Problem Relation Age of Onset  . Rheum arthritis Mother   . Cancer Father     Social History Social History   Tobacco Use  . Smoking status: Former Smoker    Quit date: 07/06/1994    Years since quitting: 24.9  . Smokeless tobacco: Never Used  Substance Use Topics  . Alcohol use: Yes    Alcohol/week: 1.0 standard drinks    Types: 1 Shots of liquor per week    Comment: 1 bourbon daily  . Drug use: No     Allergies   Patient has no known allergies.   Review of Systems Review of Systems  Constitutional: Negative for fever.  Respiratory: Positive for cough.   Gastrointestinal: Positive for vomiting. Negative for abdominal pain and diarrhea.  All other systems reviewed and are negative.    Physical Exam Updated Vital Signs BP 108/60 (BP Location: Right Arm)   Pulse (!) 102   Temp (!) 97.4 F (36.3 C) (Oral)   Resp 16   Ht 1.702 m (5\' 7" )   Wt 53.1 kg   SpO2 100%   BMI 18.33 kg/m   Physical Exam CONSTITUTIONAL: Elderly and frail HEAD: Normocephalic/atraumatic, VP shunt reservoir noted to scalp EYES: EOMI/PERRL ENMT: Mucous membranes dry, poor dentition NECK: supple no meningeal signs SPINE/BACK:entire spine nontender CV: Tachycardic, irregular LUNGS:  Coarse breath sounds bilaterally ABDOMEN: soft, nontender, no rebound or guarding, bowel sounds noted throughout abdomen Rectal-no blood, no melena, chaperone present GU:no cva tenderness, diaper in place.  Normal external genitalia NEURO: Pt is awake/alert, answers questions appropriately.  Moves all extremities EXTREMITIES: pulses normal/equal, full ROM, no deformity SKIN: warm, color normal PSYCH: Unable to assess ED Treatments / Results  Labs (all labs ordered are listed, but only abnormal results are displayed) Labs Reviewed  COMPREHENSIVE METABOLIC PANEL - Abnormal; Notable for the following components:      Result Value   Chloride 117 (*)    CO2 <7 (*)    Glucose, Bld 215 (*)    BUN 105 (*)    Creatinine, Ser 4.01 (*)    Calcium 7.6 (*)    Total Protein 5.3 (*)    Albumin 2.9 (*)  GFR calc non Af Amer 12 (*)    GFR calc Af Amer 14 (*)    All other components within normal limits  CBC - Abnormal; Notable for the following components:   WBC 18.2 (*)    RBC 2.29 (*)    Hemoglobin 8.1 (*)    HCT 25.2 (*)    MCV 110.0 (*)    MCH 35.4 (*)    All other components within normal limits  POC OCCULT BLOOD, ED - Abnormal; Notable for the following components:   Fecal Occult Bld POSITIVE (*)    All other components within normal limits  SARS CORONAVIRUS 2 (TAT 6-24 HRS)  CULTURE, BLOOD (ROUTINE X 2)  CULTURE, BLOOD (ROUTINE X 2)  LIPASE, BLOOD  URINALYSIS, ROUTINE W REFLEX MICROSCOPIC  LACTIC ACID, PLASMA  LACTIC ACID, PLASMA  TYPE AND SCREEN    EKG EKG Interpretation  Date/Time:  Thursday June 10 2019 06:44:32 EST Ventricular Rate:  127 PR Interval:    QRS Duration: 117 QT Interval:  332 QTC Calculation: 509 R Axis:   -56 Text Interpretation: likely atrial fibrilation LAD, consider left anterior fascicular block Low voltage, extremity and precordial leads Artifact in lead(s) I II III aVR aVL aVF V1 V2 V3 V4 V5 V6 Interpretation limited secondary to artifact  Abnormal ECG Confirmed by Ripley Fraise [phone]) on 06/10/2019 7:06:03 AM   Radiology Dg Chest Port 1 View  Result Date: 06/10/2019 CLINICAL DATA:  Vomiting. Possible aspiration. EXAM: PORTABLE CHEST 1 VIEW COMPARISON:  03/04/2019 FINDINGS: Stable upper normal heart size. Unchanged mediastinal contours with aortic atherosclerosis and tortuosity. Patchy bilateral perihilar airspace disease, new from prior with central bronchial thickening. No pulmonary edema, large pleural effusion or pneumothorax. Skin fold projects over the right lateral chest wall. No visualized pneumomediastinum. Shunt catheter tubing projects over the right hemithorax. Bones are under mineralized. IMPRESSION: Patchy bilateral perihilar airspace disease, new from prior exam, may be atelectasis or pneumonia, including aspiration. Electronically Signed   By: Keith Rake M.D.   On: 06/10/2019 06:12    Procedures .Critical Care Performed by: Ripley Fraise, MD Authorized by: Ripley Fraise, MD   Critical care provider statement:    Critical care start time:  06/10/2019 7:06 AM   Critical care end time:  06/10/2019 7:06 AM   Critical care time was exclusive of:  Separately billable procedures and treating other patients   Critical care was necessary to treat or prevent imminent or life-threatening deterioration of the following conditions:  Sepsis, shock and dehydration   Critical care was time spent personally by me on the following activities:  Re-evaluation of patient's condition, ordering and review of radiographic studies, pulse oximetry, ordering and review of laboratory studies, ordering and performing treatments and interventions, examination of patient, evaluation of patient's response to treatment and review of old charts   I assumed direction of critical care for this patient from another provider in my specialty: no        Medications Ordered in ED Medications  Ampicillin-Sulbactam (UNASYN) 3 g in  sodium chloride 0.9 % 100 mL IVPB (has no administration in time range)  sodium chloride 0.9 % bolus 1,000 mL (has no administration in time range)    And  sodium chloride 0.9 % bolus 500 mL (has no administration in time range)    And  sodium chloride 0.9 % bolus 250 mL (has no administration in time range)  sodium chloride flush (NS) 0.9 % injection 3 mL (3 mLs Intravenous Given 06/10/19 0553)  Initial Impression / Assessment and Plan / ED Course  I have reviewed the triage vital signs and the nursing notes.  Pertinent labs & imaging results that were available during my care of the patient were reviewed by me and considered in my medical decision making (see chart for details).        6:07 AM Patient presented from nursing facility for vomiting.  He reports multiple episodes.  It was reported he had coffee-ground emesis and was hypotensive.  His vital signs are now improved.  I called the nursing facility for further details but was unable to get his nurse.  Labs are pending at this time Patient has no abdominal tenderness at this time Patient with history of A. fib, on aspirin only due to risk of falls 7:07 AM X-ray reveals possible aspiration pneumonia in the setting of recent vomiting.  Will start code sepsis protocol with IV fluids and IV antibiotics including Unasyn Labs reveal significant renal failure with metabolic acidosis.  Patient denies abdominal pain but is a poor historian.  Will obtain CT abdomen pelvis without contrast. Patient does have positive Hemoccult, but hemoglobin is stable. Signed out to Dr. Sedonia Small with imaging and labs pending patient need to be admitted Final Clinical Impressions(s) / ED Diagnoses   Final diagnoses:  Hematemesis, presence of nausea not specified  Non-intractable vomiting with nausea, unspecified vomiting type  Dehydration  AKI (acute kidney injury) [facility])  Metabolic acidosis    ED Discharge Orders    None       Ripley Fraise, MD 06/10/19 0710

## 2019-06-10 NOTE — ED Notes (Signed)
Clear liquid dinner tray ordered 

## 2019-06-10 NOTE — ED Provider Notes (Signed)
  Provider Note MRN:  [id number]  Arrival date & time: 06/10/19    ED Course and Medical Decision Making  Assumed care from Dr. Christy Gentles at shift change.  ?hematemesis and aspiration from nursing home, has received unasyn, labs markedly abnormal, awaiting blood gas, CT abd, needs admit.  9:15 AM update: Imaging reveals constipation but no acute abdominal process.  Patient had some documented hypotension but on my assessment patient systolics are in the low [id number], he is mentating normally, he is in no acute distress.  Will admit to the stepdown unit for aspiration pneumonia and concern for sepsis.  .Critical Care Performed by: Maudie Flakes, MD Authorized by: Maudie Flakes, MD   Critical care provider statement:    Critical care time (minutes):  34   Critical care was necessary to treat or prevent imminent or life-threatening deterioration of the following conditions:  Metabolic crisis   Critical care was time spent personally by me on the following activities:  Discussions with consultants, evaluation of patient's response to treatment, examination of patient, ordering and performing treatments and interventions, ordering and review of laboratory studies, ordering and review of radiographic studies, pulse oximetry, re-evaluation of patient's condition, obtaining history from patient or surrogate and review of old charts   I assumed direction of critical care for this patient from another provider in my specialty: yes       Final Clinical Impressions(s) / ED Diagnoses     ICD-10-CM   1. Hematemesis, presence of nausea not specified  K92.0   2. Non-intractable vomiting with nausea, unspecified vomiting type  R11.2   3. Dehydration  E86.0   4. AKI (acute kidney injury) ([place])  N17.9   5. Metabolic acidosis  0000000     ED Discharge Orders    None      Discharge Instructions   None     Barth Kirks. Sedonia Small, [place]  [email or web address]    Maudie Flakes, MD 06/10/19 [phone]

## 2019-06-11 ENCOUNTER — Inpatient Hospital Stay (HOSPITAL_COMMUNITY): Payer: Medicare HMO

## 2019-06-11 ENCOUNTER — Encounter (HOSPITAL_COMMUNITY): Payer: Self-pay | Admitting: Emergency Medicine

## 2019-06-11 ENCOUNTER — Inpatient Hospital Stay (HOSPITAL_COMMUNITY): Payer: Medicare HMO | Admitting: Certified Registered"

## 2019-06-11 ENCOUNTER — Encounter (HOSPITAL_COMMUNITY)
Admission: EM | Disposition: A | Discharge status: Skilled Nursing Facility | Payer: Self-pay | Source: Skilled Nursing Facility | Attending: Internal Medicine

## 2019-06-11 DIAGNOSIS — N183 Chronic kidney disease, stage 3 unspecified: Secondary | ICD-10-CM

## 2019-06-11 DIAGNOSIS — J690 Pneumonitis due to inhalation of food and vomit: Secondary | ICD-10-CM

## 2019-06-11 HISTORY — PX: BIOPSY: SHX5522

## 2019-06-11 HISTORY — PX: ESOPHAGOGASTRODUODENOSCOPY (EGD) WITH PROPOFOL: SHX5813

## 2019-06-11 LAB — CBC
HCT: 19.1 % — ABNORMAL LOW (ref 39.0–52.0)
Hemoglobin: 6.4 g/dL — CL (ref 13.0–17.0)
MCH: 35.6 pg — ABNORMAL HIGH (ref 26.0–34.0)
MCHC: 33.5 g/dL (ref 30.0–36.0)
MCV: 106.1 fL — ABNORMAL HIGH (ref 80.0–100.0)
Platelets: 203 K/uL (ref 150–400)
RBC: 1.8 MIL/uL — ABNORMAL LOW (ref 4.22–5.81)
RDW: 14.8 % (ref 11.5–15.5)
WBC: 25.2 K/uL — ABNORMAL HIGH (ref 4.0–10.5)
nRBC: 0 % (ref 0.0–0.2)

## 2019-06-11 LAB — BASIC METABOLIC PANEL
Anion gap: 17 — ABNORMAL HIGH (ref 5–15)
BUN: 100 mg/dL — ABNORMAL HIGH (ref 8–23)
CO2: 9 mmol/L — ABNORMAL LOW (ref 22–32)
Calcium: 7.2 mg/dL — ABNORMAL LOW (ref 8.9–10.3)
Chloride: 120 mmol/L — ABNORMAL HIGH (ref 98–111)
Creatinine, Ser: 4.06 mg/dL — ABNORMAL HIGH (ref 0.61–1.24)
GFR calc Af Amer: 14 mL/min — ABNORMAL LOW (ref >60)
GFR calc non Af Amer: 12 mL/min — ABNORMAL LOW (ref >60)
Glucose, Bld: 86 mg/dL (ref 70–99)
Potassium: 3.9 mmol/L (ref 3.5–5.1)
Sodium: 146 mmol/L — ABNORMAL HIGH (ref 135–145)

## 2019-06-11 LAB — GLUCOSE, CAPILLARY
Glucose-Capillary: 76 mg/dL (ref 70–99)
Glucose-Capillary: 64 mg/dL — ABNORMAL LOW (ref 70–99)
Glucose-Capillary: 92 mg/dL (ref 70–99)

## 2019-06-11 LAB — PREPARE RBC (CROSSMATCH)

## 2019-06-11 SURGERY — ESOPHAGOGASTRODUODENOSCOPY (EGD) WITH PROPOFOL
Anesthesia: Monitor Anesthesia Care

## 2019-06-11 MED ORDER — SODIUM CHLORIDE 0.9 % IV BOLUS
500.0000 mL | Freq: Once | INTRAVENOUS | Status: AC
  Administered 2019-06-11: 500 mL via INTRAVENOUS

## 2019-06-11 MED ORDER — SODIUM BICARBONATE 650 MG PO TABS: 650.0000 mg | ORAL_TABLET | Freq: Every day | ORAL | Status: DC

## 2019-06-11 MED ORDER — SODIUM CHLORIDE 0.9 % IV SOLN
INTRAVENOUS | Status: DC | PRN
  Administered 2019-06-11: 11:00:00 via INTRAVENOUS

## 2019-06-11 MED ORDER — PANTOPRAZOLE SODIUM 40 MG IV SOLR: 40.0000 mg | INTRAVENOUS | Status: DC

## 2019-06-11 MED ORDER — VITAMIN B-12 100 MCG PO TABS: 500.0000 ug | ORAL_TABLET | Freq: Every day | ORAL | Status: DC

## 2019-06-11 MED ORDER — PHENYLEPHRINE 40 MCG/ML (10ML) SYRINGE FOR IV PUSH (FOR BLOOD PRESSURE SUPPORT)
PREFILLED_SYRINGE | INTRAVENOUS | Status: DC | PRN
  Administered 2019-06-11: 80 ug via INTRAVENOUS
  Administered 2019-06-11: 120 ug via INTRAVENOUS
  Administered 2019-06-11 (×4): 80 ug via INTRAVENOUS

## 2019-06-11 MED ORDER — FUROSEMIDE 10 MG/ML IJ SOLN
20.0000 mg | Freq: Once | INTRAMUSCULAR | Status: AC
  Administered 2019-06-11: 20 mg via INTRAVENOUS
  Filled 2019-06-11: qty 2

## 2019-06-11 MED ORDER — GLYCOPYRROLATE PF 0.2 MG/ML IJ SOSY
PREFILLED_SYRINGE | INTRAMUSCULAR | Status: DC | PRN
  Administered 2019-06-11: .1 mg via INTRAVENOUS

## 2019-06-11 MED ORDER — VITAMIN B-1 100 MG PO TABS: 100.0000 mg | ORAL_TABLET | Freq: Every day | ORAL | Status: DC

## 2019-06-11 MED ORDER — SUCRALFATE 1 GM/10ML PO SUSP: 1.0000 g | Freq: Three times a day (TID) | ORAL | Status: DC

## 2019-06-11 MED ORDER — SODIUM BICARBONATE-DEXTROSE 150-5 MEQ/L-% IV SOLN
150.0000 meq | INTRAVENOUS | Status: DC
  Administered 2019-06-11: 150 meq via INTRAVENOUS
  Filled 2019-06-11 (×2): qty 1000

## 2019-06-11 MED ORDER — LIDOCAINE 2% (20 MG/ML) 5 ML SYRINGE
INTRAMUSCULAR | Status: DC | PRN
  Administered 2019-06-11: 40 mg via INTRAVENOUS

## 2019-06-11 MED ORDER — PROPOFOL 10 MG/ML IV BOLUS
INTRAVENOUS | Status: DC | PRN
  Administered 2019-06-11 (×2): 50 mg via INTRAVENOUS

## 2019-06-11 MED ORDER — GUAIFENESIN ER 600 MG PO TB12
600.0000 mg | ORAL_TABLET | Freq: Two times a day (BID) | ORAL | Status: DC
  Administered 2019-06-12: 600 mg via ORAL
  Filled 2019-06-11: qty 1

## 2019-06-11 MED ORDER — SODIUM CHLORIDE 0.9% IV SOLUTION
Freq: Once | INTRAVENOUS | Status: AC
  Administered 2019-06-11: 06:00:00 via INTRAVENOUS

## 2019-06-11 MED ORDER — SODIUM CHLORIDE 0.9 % IV SOLN
INTRAVENOUS | Status: DC
  Administered 2019-06-11: 11:00:00 via INTRAVENOUS

## 2019-06-11 SURGICAL SUPPLY — 15 items

## 2019-06-11 NOTE — Progress Notes (Signed)
Craig Whitehead is a 85 y.o. male patient admitted from ED awake, alert - oriented  X 4 - no acute distress noted.  VSS - Blood pressure (!) 124/51, pulse 98, temperature 98.1 F (36.7 C), temperature source Oral, resp. rate 19, height 5\' 7"  (1.702 m), weight 53.1 kg, SpO2 96 %.    IV in place, occlusive dsg intact without redness.  Orientation to room, and floor completed with information packet given to patient/family.  Patient declined safety video at this time.  Admission INP armband ID verified with patient/family, and in place.   SR up x 2, fall assessment complete, with patient and family able to verbalize understanding of risk associated with falls, and verbalized understanding to call nsg before up out of bed.  Call light within reach, patient able to voice, and demonstrate understanding.  Skin, clean-dry- intact with a stage 2 pressure injury on sacrum.   Will cont to eval and treat per MD orders.  [place], RN 06/11/2019 3:30 AM

## 2019-06-11 NOTE — Anesthesia Procedure Notes (Signed)
Procedure Name: MAC Date/Time: 06/11/2019 11:42 AM Performed by: Orlie Dakin, CRNA Pre-anesthesia Checklist: Patient identified, Emergency Drugs available, Suction available and Patient being monitored Patient Re-evaluated:Patient Re-evaluated prior to induction Oxygen Delivery Method: Nasal cannula Preoxygenation: Pre-oxygenation with 100% oxygen Induction Type: IV induction Placement Confirmation: positive ETCO2

## 2019-06-11 NOTE — Progress Notes (Signed)
RN called RT for evaluation of a breathing tx. RT evaluated pt. Pt has audible crackling heard from pt's upper airway. BBS have coarse crackles throughout. RT used a yaunker to suction the back of pt's throat. Poor gag reflex noted. Moderate amount of thick, tan secretions removed. Pt asked to cough. Strong cough upon request. Pt suctioned a totaled of 4 times. PRN albuterol not given at this time due to tachycardic HR 115-130 bpm and no expiratory wheezing heard on auscultation. SATs remain 100%. RR 18-24. RT will be available should pt have further needs. RN notified.

## 2019-06-11 NOTE — Anesthesia Postprocedure Evaluation (Signed)
Anesthesia Post Note  Patient: Craig Whitehead  Procedure(s) Performed: ESOPHAGOGASTRODUODENOSCOPY (EGD) WITH PROPOFOL (N/A ) BIOPSY     Patient location during evaluation: PACU Anesthesia Type: MAC Level of consciousness: awake and alert Pain management: pain level controlled Vital Signs Assessment: post-procedure vital signs reviewed and stable Respiratory status: spontaneous breathing, nonlabored ventilation, respiratory function stable and patient connected to nasal cannula oxygen Cardiovascular status: stable and blood pressure returned to baseline Postop Assessment: no apparent nausea or vomiting Anesthetic complications: no    Last Vitals:  Vitals:   06/11/19 1215 06/11/19 1227  BP: 94/66 (!) 87/49  Pulse: (!) 103   Resp: 18 14  Temp:    SpO2: 100% 99%    Last Pain:  Vitals:   06/11/19 1227  TempSrc:   PainSc: 0-No pain                 Shalawn Wynder

## 2019-06-11 NOTE — Evaluation (Signed)
Clinical/Bedside Swallow Evaluation Patient Details  Name: Craig Whitehead MRN: [id number] Date of Birth: [birth year]  Today's Date: 06/11/2019 Time: SLP Start Time (ACUTE ONLY): 54 SLP Stop Time (ACUTE ONLY): 1440 SLP Time Calculation (min) (ACUTE ONLY): 35 min  Past Medical History:  Past Medical History:  Diagnosis Date  . Anxiety   . Atrial fibrillation ([place])   . Bladder cancer ([place])   . BPH (benign prostatic hypertrophy)   . Diabetes mellitus ([place])   . Diarrhea   . Difficult intubation   . ED (erectile dysfunction)   . GERD (gastroesophageal reflux disease)   . Hypercholesteremia   . Hypertension   . NPH (normal pressure hydrocephalus) ([place])   . Seborrheic dermatitis   . Vitamin D deficiency    Past Surgical History:  Past Surgical History:  Procedure Laterality Date  . BLADDER SURGERY     x 4 for bladder cancer  . BRAIN SURGERY    . CATARACT EXTRACTION Bilateral   . INGUINAL HERNIA REPAIR Bilateral   . TRANSTHORACIC ECHOCARDIOGRAM  05/2016   [facility]: EF 45-50%. Mildly reduced function (likely related to A. fib). Marketed LA dilation. Moderate RA dilation. Aortic sclerosis without stenosis. Mild-moderate TR. Mild-moderate pulmonary tension. Small pericardial effusion versus pericardial fat  . VENTRICULOPERITONEAL SHUNT  2002   HPI:  85yo male admitted 06/10/2019 with vomiting blood. PMH: chronic AFib, DM2, NPH s/p VP shunt, GERD, BPH, bladder cancer (s/p resection), HTN. CXR = Left lower lobe airspace disease concerning for pneumonia. EGD 06/11/2019 = Distal esophagitis, low-grade narrowing with Schatzki's ring, medium sized hiatal hernia.   Assessment / Plan / Recommendation Clinical Impression  Pt presents with crackly breathing sounds and congested nonproductive cough prior to po presentations. Oral cavity noted to be very dry, with poor condition or missing dentition. Oral care was completed with suction, which pt appeared to have  difficulty tolerating - took my hand and grimaced. Pt exhibited congested nonproductive cough following oral care, but reports no difficulty swallowing. Pt accepted trials of ice chips and thin liquids, allowing time between each presentation. Pt continues to exhibit intermittent congested nonproductive cough and appears quite fatigued. Given weakness of cough, generalized weakness, deconditioning, and medical status (low BP and AFib per RN), recommend strict NPO at this time. SLP will continue to follow for readiness for po intake. If NPO continues to be recommended, consideration of nonoral feeding method will be encouraged. Palliative Care consult also appropriate at this time. Rn and MD informed of results and recommendations.    SLP Visit Diagnosis: Dysphagia, unspecified (R13.10)    Aspiration Risk  Severe aspiration risk;Risk for inadequate nutrition/hydration    Diet Recommendation NPO   Medication Administration: Via alternative means    Other  Recommendations Recommended Consults: (EGD this morning) Oral Care Recommendations: Oral care QID Other Recommendations: Have oral suction available;Remove water pitcher   Follow up Recommendations 24 hour supervision/assistance      Frequency and Duration min 1 x/week  2 weeks       Prognosis Prognosis for Safe Diet Advancement: Fair Barriers to Reach Goals: Severity of deficits      Swallow Study   General Date of Onset: 06/10/19 HPI: 85yo male admitted 06/10/2019 with vomiting blood. PMH: chronic AFib, DM2, NPH s/p VP shunt, GERD, BPH, bladder cancer (s/p resection), HTN. CXR = Left lower lobe airspace disease concerning for pneumonia. EGD 06/11/2019 = Distal esophagitis, low-grade narrowing with Schatzki's ring, medium sized hiatal hernia. Type of Study: Bedside Swallow Evaluation  Previous Swallow Assessment: none Diet Prior to this Study: Thin liquids Temperature Spikes Noted: No Respiratory Status: Nasal  cannula Behavior/Cognition: Alert;Cooperative;Pleasant mood Oral Cavity Assessment: Dry Oral Care Completed by SLP: Yes Oral Cavity - Dentition: Poor condition;Missing dentition Self-Feeding Abilities: Total assist Patient Positioning: Upright in bed Baseline Vocal Quality: Normal Volitional Cough: Congested;Weak(nonproductive) Volitional Swallow: Able to elicit    Oral/Motor/Sensory Function Overall Oral Motor/Sensory Function: Generalized oral weakness   Ice Chips Ice chips: Impaired Pharyngeal Phase Impairments: Cough - Immediate;Cough - Delayed   Thin Liquid Thin Liquid: Impaired Presentation: Straw Pharyngeal  Phase Impairments: Cough - Immediate;Cough - Delayed    Nectar Thick Nectar Thick Liquid: Not tested   Honey Thick Honey Thick Liquid: Not tested   Puree Puree: Not tested   Solid     Solid: Not tested     Carmela Rima, CCC-SLP Speech Language Pathologist Office: [phone]  Shonna Chock 06/11/2019,2:55 PM

## 2019-06-11 NOTE — Anesthesia Preprocedure Evaluation (Addendum)
Anesthesia Evaluation  Patient identified by MRN, date of birth, ID band Patient awake    Reviewed: Allergy & Precautions, H&P , NPO status , Patient's Chart, lab work & pertinent test results, reviewed documented beta blocker date and time   History of Anesthesia Complications (+) DIFFICULT AIRWAY  Airway Mallampati: II  TM Distance: >3 FB Neck ROM: full    Dental no notable dental hx.    Pulmonary neg pulmonary ROS, former smoker,    Pulmonary exam normal + rhonchi  + decreased breath sounds      Cardiovascular Exercise Tolerance: Good hypertension, + dysrhythmias Atrial Fibrillation  Rhythm:regular Rate:Normal     Neuro/Psych PSYCHIATRIC DISORDERS Anxiety Depression negative neurological ROS     GI/Hepatic Neg liver ROS, GERD  ,  Endo/Other  negative endocrine ROSdiabetes  Renal/GU ARFRenal disease  negative genitourinary   Musculoskeletal   Abdominal   Peds  Hematology  (+) Blood dyscrasia, anemia ,   Anesthesia Other Findings   Reproductive/Obstetrics negative OB ROS                           Anesthesia Physical Anesthesia Plan  ASA: II  Anesthesia Plan: MAC   Post-op Pain Management:    Induction:   PONV Risk Score and Plan: Treatment may vary due to age or medical condition  Airway Management Planned: Nasal Cannula and Simple Face Mask  Additional Equipment:   Intra-op Plan:   Post-operative Plan:   Informed Consent: I have reviewed the patients History and Physical, chart, labs and discussed the procedure including the risks, benefits and alternatives for the proposed anesthesia with the patient or authorized representative who has indicated his/her understanding and acceptance.     Dental Advisory Given  Plan Discussed with: CRNA, Anesthesiologist and Surgeon  Anesthesia Plan Comments:         Anesthesia Quick Evaluation

## 2019-06-11 NOTE — Transfer of Care (Signed)
Immediate Anesthesia Transfer of Care Note  Patient: Craig Whitehead  Procedure(s) Performed: ESOPHAGOGASTRODUODENOSCOPY (EGD) WITH PROPOFOL (N/A ) BIOPSY  Patient Location: Endoscopy Unit  Anesthesia Type:MAC  Level of Consciousness: drowsy  Airway & Oxygen Therapy: Patient Spontanous Breathing and Patient connected to nasal cannula oxygen  Post-op Assessment: Report given to RN and Post -op Vital signs reviewed and stable  Post vital signs: Reviewed and stable  Last Vitals:  Vitals Value Taken Time  BP    Temp    Pulse    Resp    SpO2      Last Pain:  Vitals:   06/11/19 1035  TempSrc: Temporal  PainSc: 0-No pain         Complications: No apparent anesthesia complications

## 2019-06-11 NOTE — Progress Notes (Signed)
Patient ID: Craig Whitehead, male   DOB: [birth year], 85 y.o.   MRN: [id number]  PROGRESS NOTE    JEROD BATTEN  [name] DOB: [birth year] DOA: 06/10/2019 PCP: Josetta Huddle, MD   Brief Narrative:  85 year old male with history of chronic atrial fibrillation not on anticoagulation, diabetes mellitus type 2, NPH status post VP shunt, GERD, BPH and bladder cancer status post resection presented with coffee-ground emesis.  Hemoglobin was 8.1, baseline is around 7-8.  CT of the abdomen showed large stool burden, chronic calcified pancreatitis and small hiatal hernia.  WBC was 18.2.  Chest x-ray showed patchy bilateral perihilar airspace disease concerning for aspiration pneumonia.  COVID-19 testing was negative.  GI was consulted.  He was started on IV fluids and antibiotics.  Assessment & Plan:   Probable upper GI bleeding Acute on chronic blood loss anemia -Baseline hemoglobin around 7-8.  Presented with coffee-ground emesis.  Hemoglobin this morning was 6.4.  Currently being transfused packed red cells.  Monitor H&H.  Continue Protonix. -GI following.  Probable EGD today.  Sepsis: Present on admission Aspiration pneumonia Leukocytosis Hypoxia -Chest x-ray showed patchy bilateral perihilar airspace disease concerning for aspiration pneumonia.  Continue Unasyn.  Leukocytosis getting worse.  Blood pressure still on the lower side. -Currently requiring oxygen via nasal cannula at 2 L/min -Initial COVID-19 testing negative  Acute renal failure superimposed on chronic kidney disease stage III Acute metabolic acidosis -Baseline creatinine around 1.8-2 -Presented with creatinine of 4.01.  Creatinine 4.06 this morning -Currently on bicarb drip.  Bicarb is 9 this morning. -We will get renal ultrasound.  If renal function worsens, might need nephrology evaluation  Prolonged QT interval -QTC was 506 on admission. -Much improved.  QTC 455 this morning  Permanent atrial  fibrillation -Not on anticoagulation as an outpatient.  Of history of falls.  Currently intermittently tachycardic.  Use metoprolol as needed.  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  Diabetes mellitus type 2 with hyperglycemia -A1c was 6.5 -Continue CBGs with SSI.  History of bladder cancer -Apparently underwent 4 surgeries to have bladder cancer resected  Generalized deconditioning Weight loss Probable severe protein calorie malnutrition -Extremely Deconditioned.  Approximately last 70 pounds of weight over the last year. -We will request medical care evaluation.  If condition worsens, might be appropriate for hospice/comfort measures  DVT prophylaxis: SCDs Code Status: DNR Family Communication: Spoke to [place]/daughter on phone on 06/11/2019.  Her phone number is [phone] Disposition Plan: Depends on clinical outcome  Consultants: GI  Procedures: None  Antimicrobials: Unasyn   Subjective: Patient seen and examined at bedside.  He wakes up slightly, hardly answers any questions.  No overnight fever, vomiting reported by nursing staff.  Objective: Vitals:   06/11/19 0745 06/11/19 0822 06/11/19 0911 06/11/19 1035  BP:  93/64  (!) 97/54  Pulse: (!) 128 (!) 118  97  Resp: 18 14  14   Temp:  98.1 F (36.7 C) 98.2 F (36.8 C)   TempSrc:  Axillary Oral Temporal  SpO2: 100% 100%  100%  Weight:    53 kg  Height:        Intake/Output Summary (Last 24 hours) at 06/11/2019 1053 Last data filed at 06/11/2019 0911 Gross per 24 hour  Intake 1654 ml  Output --  Net 1654 ml   Filed Weights   06/10/19 0548 06/10/19 0907 06/11/19 1035  Weight: 53.1 kg 53.1 kg 53 kg  Examination:  General exam: Looks chronically ill.  Wakes up only very slightly.  Elderly gentleman lying in bed.  No distress. Respiratory system: Bilateral decreased breath sounds at bases with diffuse  scattered crackles Cardiovascular system: S1 & S2 heard, tachycardic Gastrointestinal system: Abdomen is nondistended, soft and nontender. Normal bowel sounds heard. Extremities: No cyanosis, clubbing; trace edema Central nervous system: Sleepy, wakes up slightly, hardly answers any questions. No focal neurological deficits. Moving extremities Skin: No rashes, lesions or ulcers Psychiatry: Could not be assessed because of mental status    Data Reviewed: I have personally reviewed following labs and imaging studies  CBC: Recent Labs  Lab 06/10/19 0550 06/10/19 1005 06/10/19 1500 06/10/19 1750 06/11/19 0224  WBC 18.2*  --   --   --  25.2*  HGB 8.1* 7.8* 7.9* 7.3* 6.4*  HCT 25.2* 23.9* 23.9* 21.7* 19.1*  MCV 110.0*  --   --   --  106.1*  PLT 243  --   --   --  123456   Basic Metabolic Panel: Recent Labs  Lab 06/10/19 0550 06/10/19 1005 06/10/19 1500 06/11/19 0224  NA 143  --  144 146*  K 4.5  --  4.3 3.9  CL 117*  --  122* 120*  CO2 <7*  --  7* 9*  GLUCOSE 215*  --  151* 86  BUN 105*  --  99* 100*  CREATININE 4.01*  --  3.70* 4.06*  CALCIUM 7.6*  --  7.0* 7.2*  MG  --  1.7  --   --    GFR: Estimated Creatinine Clearance: 9.2 mL/min (A) (by C-G formula based on SCr of 4.06 mg/dL (H)). Liver Function Tests: Recent Labs  Lab 06/10/19 0550  AST 22  ALT 34  ALKPHOS 77  BILITOT 1.0  PROT 5.3*  ALBUMIN 2.9*   Recent Labs  Lab 06/10/19 0550  LIPASE 14   No results for input(s): AMMONIA in the last 168 hours. Coagulation Profile: Recent Labs  Lab 06/10/19 1500  INR 1.7*   Cardiac Enzymes: No results for input(s): CKTOTAL, CKMB, CKMBINDEX, TROPONINI in the last 168 hours. BNP (last 3 results) No results for input(s): PROBNP in the last 8760 hours. HbA1C: Recent Labs    06/10/19 1500  HGBA1C 6.5*   CBG: Recent Labs  Lab 06/10/19 1737 06/11/19 0937  GLUCAP 130* 76   Lipid Profile: No results for input(s): CHOL, HDL, LDLCALC, TRIG, CHOLHDL, LDLDIRECT  in the last 72 hours. Thyroid Function Tests: No results for input(s): TSH, T4TOTAL, FREET4, T3FREE, THYROIDAB in the last 72 hours. Anemia Panel: No results for input(s): VITAMINB12, FOLATE, FERRITIN, TIBC, IRON, RETICCTPCT in the last 72 hours. Sepsis Labs: Recent Labs  Lab 06/10/19 0703 06/10/19 1404 06/10/19 1750  LATICACIDVEN 2.5* 1.9 1.6    Recent Results (from the past 240 hour(s))  Blood culture (routine x 2)     Status: None (Preliminary result)   Collection Time: 06/10/19  7:03 AM   Specimen: BLOOD  Result Value Ref Range Status   Specimen Description BLOOD SITE NOT SPECIFIED  Final   Special Requests   Final    BOTTLES DRAWN AEROBIC AND ANAEROBIC Blood Culture results may not be optimal due to an inadequate volume of blood received in culture bottles   Culture   Final    NO GROWTH 1 DAY Performed at [facility], [place] [address], [place], [place] [address]    Report Status PENDING  Incomplete  Blood culture (routine x  2)     Status: None (Preliminary result)   Collection Time: 06/10/19  7:03 AM   Specimen: BLOOD  Result Value Ref Range Status   Specimen Description BLOOD SITE NOT SPECIFIED  Final   Special Requests   Final    BOTTLES DRAWN AEROBIC AND ANAEROBIC Blood Culture results may not be optimal due to an inadequate volume of blood received in culture bottles   Culture   Final    NO GROWTH 1 DAY Performed at [facility], [place] [address], [place], [place] [address]    Report Status PENDING  Incomplete  SARS CORONAVIRUS 2 (TAT 6-24 HRS) Nasopharyngeal Nasopharyngeal Swab     Status: None   Collection Time: 06/10/19  9:04 AM   Specimen: Nasopharyngeal Swab  Result Value Ref Range Status   SARS Coronavirus 2 NEGATIVE NEGATIVE Final    Comment: (NOTE) SARS-CoV-2 target nucleic acids are NOT DETECTED. The SARS-CoV-2 RNA is generally detectable in upper and lower respiratory specimens during the acute phase of infection. Negative results do not  preclude SARS-CoV-2 infection, do not rule out co-infections with other pathogens, and should not be used as the sole basis for treatment or other patient management decisions. Negative results must be combined with clinical observations, patient history, and epidemiological information. The expected result is Negative. Fact Sheet for Patients: SugarRoll.be Fact Sheet for Healthcare Providers: https://www.woods-mathews.com/ This test is not yet approved or cleared by the [place] FDA and  has been authorized for detection and/or diagnosis of SARS-CoV-2 by FDA under an Emergency Use Authorization (EUA). This EUA will remain  in effect (meaning this test can be used) for the duration of the COVID-19 declaration under Section 56 4(b)(1) of the Act, 21 U.S.C. section 360bbb-3(b)(1), unless the authorization is terminated or revoked sooner. Performed at [facility], [place] [address] [place], [place] [address]          Radiology Studies: Ct Abdomen Pelvis Wo Contrast  Result Date: 06/10/2019 CLINICAL DATA:  Nausea and vomiting. EXAM: CT ABDOMEN AND PELVIS WITHOUT CONTRAST TECHNIQUE: Multidetector CT imaging of the abdomen and pelvis was performed following the standard protocol without IV contrast. COMPARISON:  None. FINDINGS: Lower chest: Focal bronchiectasis in both lower lobes. Heart size is normal. Hepatobiliary: No focal liver abnormality is seen. No gallstones, gallbladder wall thickening, or biliary dilatation. Pancreas: Diffuse pancreatic atrophy. Numerous calcifications throughout the pancreas consistent with chronic calcific pancreatitis. Spleen: Normal. Adrenals/Urinary Tract: The adrenal glands are normal. Multiple bilateral renal calculi. No hydronephrosis. 6.7 cm cyst on the lower pole of the left kidney. 3.2 cm cyst on the upper pole of the left kidney. Focal small stones in the dependent portion of the bladder. Stomach/Bowel:  Large amount of stool in the distended rectum. Moderate stool throughout the remainder of the colon. Small hiatal hernia. Small bowel appears normal. Appendix is not visualized. Vascular/Lymphatic: Aortic atherosclerosis. No enlarged abdominal or pelvic lymph nodes. Reproductive: Prostate is unremarkable. Other: Peritoneal catheter in the right side of the abdomen. Musculoskeletal: No acute or significant osseous findings. IMPRESSION: 1. Large amount of stool in the distended rectum with moderate stool throughout the remainder of the colon. 2. Chronic calcific pancreatitis. 3. Multiple bilateral renal calculi. 4. Aortic Atherosclerosis (ICD10-I70.0). Electronically Signed   By: Lorriane Shire M.D.   On: 06/10/2019 08:16   Dg Chest Port 1 View  Result Date: 06/10/2019 CLINICAL DATA:  Vomiting. Possible aspiration. EXAM: PORTABLE CHEST 1 VIEW COMPARISON:  03/04/2019 FINDINGS: Stable upper normal  heart size. Unchanged mediastinal contours with aortic atherosclerosis and tortuosity. Patchy bilateral perihilar airspace disease, new from prior with central bronchial thickening. No pulmonary edema, large pleural effusion or pneumothorax. Skin fold projects over the right lateral chest wall. No visualized pneumomediastinum. Shunt catheter tubing projects over the right hemithorax. Bones are under mineralized. IMPRESSION: Patchy bilateral perihilar airspace disease, new from prior exam, may be atelectasis or pneumonia, including aspiration. Electronically Signed   By: Keith Rake M.D.   On: 06/10/2019 06:12        Scheduled Meds:  [MAR Hold] insulin aspart  0-9 Units Subcutaneous TID WC   [MAR Hold] pantoprazole (PROTONIX) IV  40 mg Intravenous Q12H   Continuous Infusions:  [MAR Hold] ampicillin-sulbactam (UNASYN) IV      sodium bicarbonate (isotonic) infusion in sterile water 100 mL/hr at 06/11/19 0717          Aline August, MD [facility] 06/11/2019, 10:53 AM

## 2019-06-11 NOTE — Brief Op Note (Signed)
06/10/2019 - 06/11/2019  12:02 PM  PATIENT:  Latrelle Dodrill  85 y.o. male  PRE-OPERATIVE DIAGNOSIS:  hemetemesis  POST-OPERATIVE DIAGNOSIS:  hiatal hernia, multiple duodenal ulcers, biopsy  PROCEDURE:  Procedure(s): ESOPHAGOGASTRODUODENOSCOPY (EGD) WITH PROPOFOL (N/A) BIOPSY  SURGEON:  Surgeon(s) and Role:    Ronnette Juniper, MD - Primary  PHYSICIAN ASSISTANT:   ASSISTANTS: Tori Phillipps, RN, Marguerita Merles, Tech  ANESTHESIA:   MAC  EBL:  Minimal  BLOOD ADMINISTERED:none  DRAINS: none   LOCAL MEDICATIONS USED:  NONE  SPECIMEN:  Biopsy / Limited Resection  DISPOSITION OF SPECIMEN:  PATHOLOGY  COUNTS:  YES  TOURNIQUET:  * No tourniquets in log *  DICTATION: .Dragon Dictation  PLAN OF CARE: Admit to inpatient   PATIENT DISPOSITION:  PACU - hemodynamically stable.   Delay start of Pharmacological VTE agent (>24hrs) due to surgical blood loss or risk of bleeding: no

## 2019-06-11 NOTE — Op Note (Signed)
EGD was performed for coffee-ground emesis and anemia.  Findings: Distal esophagitis, low-grade narrowing with Schatzki's ring, medium sized hiatal hernia. Significant looping was encountered in the gastric body, adult gastroscope had to be exchanged with an ultrathin colonoscope to advance the scope into the duodenal bulb and second portion of duodenum. Multiple ulcers noted in the duodenal bulb first and second portion of the duodenum, all clean-based with thickened underlying duodenal fold, biopsies taken.   Recommendations: Advance diet as tolerated. Patient known to have QTc prolongation, if tolerated recommend PPI once a day for at least 1 month. Sucralfate 1 g suspension p.o. 4 times daily. Await pathology results.  Ronnette Juniper, MD

## 2019-06-11 NOTE — Op Note (Signed)
[facility] Patient Name: Craig Whitehead Procedure Date : 06/11/2019 MRN: [id number] Attending MD: Ronnette Juniper , MD Date of Birth: [birth year] CSN: [id number] Age: 85 Admit Type: Inpatient Procedure:                Upper GI endoscopy Indications:              Coffee-ground emesis Providers:                Ronnette Juniper, MD, Ashley Jacobs, RN, Elspeth Cho                            Tech., Technician, Vania Rea, CRNA Referring MD:              Medicines:                Monitored Anesthesia Care Complications:            No immediate complications. Estimated blood loss:                            Minimal. Estimated Blood Loss:     Estimated blood loss was minimal. Procedure:                Pre-Anesthesia Assessment:Patient was hypotensive                            prior to the procedure and needed IV pressor during                            the procedure while receving propofol under                            anesthesia care                           - Prior to the procedure, a History and Physical                            was performed, and patient medications and                            allergies were reviewed. The patient's tolerance of                            previous anesthesia was also reviewed. The risks                            and benefits of the procedure and the sedation                            options and risks were discussed with the patient.                            All questions were answered, and informed consent                            was  obtained. Prior Anticoagulants: The patient has                            taken no previous anticoagulant or antiplatelet                            agents. ASA Grade Assessment: IV - A patient with                            severe systemic disease that is a constant threat                            to life. After reviewing the risks and benefits,                            the patient was deemed  in satisfactory condition to                            undergo the procedure.                           After obtaining informed consent, the endoscope was                            passed under direct vision. Throughout the                            procedure, the patient's blood pressure, pulse, and                            oxygen saturations were monitored continuously. The                            [id number]) Olympus gastroscope was                            introduced through the mouth, and advanced to the                            second part of duodenum. Significant looping was                            encountered during passage of the scope and it                            could not advanced beyond the duodenal bulb.                           The scope was exchanged with an ultra thin                            colonoscope to advance in to the second portion of  the duodenum. The patient tolerated the procedure                            well. Scope In: Scope Out: Findings:      LA Grade A (one or more mucosal breaks less than 5 mm, not extending       between tops of 2 mucosal folds) esophagitis with no bleeding was found       40 to 45 cm from the incisors.      A low-grade of narrowing Schatzki ring was found at the gastroesophageal       junction. The scope could be advanced without resistance.      A medium-sized hiatal hernia was present.      The cardia and gastric fundus were normal on retroflexion.      The gastric body, incisura, gastric antrum, prepyloric region of the       stomach and pylorus were normal.      The cardia and gastric fundus were normal on retroflexion.      Significant looping was encountered during passage of the scope and it       could not be advanced beyond the duodenal bulb.The scope was exchanged       with an ultra thin colonoscope to advance in to the second portion of       the duodenum.       Many non-bleeding superficial duodenal ulcers with a clean ulcer base       (Forrest Class III) were found in the duodenal bulb, in the first       portion of the duodenum and in the second portion of the duodenum with       thickened duodenal folds. The largest lesion was 9 mm in largest       dimension. Biopsies were taken with a cold forceps for histology. Impression:               - LA Grade A esophagitis with no bleeding.                           - Low-grade of narrowing Schatzki ring.                           - Medium-sized hiatal hernia.                           - Normal gastric body, incisura, antrum, prepyloric                            region of the stomach and pylorus.                           - Non-bleeding duodenal ulcers with a clean ulcer                            base (Forrest Class III). Biopsied. Moderate Sedation:      Patient did not receive moderate sedation for this procedure, but       instead received monitored anesthesia care. Recommendation:           - Await pathology results.                           -  Advance diet as tolerated.                           - Use sucralfate suspension 1 gram PO QID for 2                            weeks.                           - Patient has QTc prolongation, if tolerated                            recommend PPI once a day for atleast 1 month. Procedure Code(s):        --- Professional ---                           [phone], Esophagogastroduodenoscopy, flexible,                            transoral; with biopsy, single or multiple Diagnosis Code(s):        --- Professional ---                           K20.90, Esophagitis, unspecified without bleeding                           K22.2, Esophageal obstruction                           K44.9, Diaphragmatic hernia without obstruction or                            gangrene                           K26.9, Duodenal ulcer, unspecified as acute or                             chronic, without hemorrhage or perforation                           K92.0, Hematemesis CPT copyright 2019 American Medical Association. All rights reserved. The codes documented in this report are preliminary and upon coder review may  be revised to meet current compliance requirements. Ronnette Juniper, MD 06/11/2019 12:02:27 PM This report has been signed electronically. Number of Addenda: 0

## 2019-06-11 NOTE — Progress Notes (Signed)
CRITICAL VALUE ALERT  Critical Value:  Hemoglobin    Date & Time Notied:  06/11/2019,  [id number]  Provider Notified: [place]  Orders Received/Actions taken: PRBC ordered

## 2019-06-12 ENCOUNTER — Inpatient Hospital Stay (HOSPITAL_COMMUNITY): Payer: Medicare HMO

## 2019-06-12 DIAGNOSIS — D72829 Elevated white blood cell count, unspecified: Secondary | ICD-10-CM

## 2019-06-12 DIAGNOSIS — E860 Dehydration: Secondary | ICD-10-CM

## 2019-06-12 DIAGNOSIS — Z7189 Other specified counseling: Secondary | ICD-10-CM

## 2019-06-12 DIAGNOSIS — Z66 Do not resuscitate: Secondary | ICD-10-CM

## 2019-06-12 LAB — CBC WITH DIFFERENTIAL/PLATELET
Abs Immature Granulocytes: 0.08 10*3/uL — ABNORMAL HIGH (ref 0.00–0.07)
Basophils Absolute: 0 10*3/uL (ref 0.0–0.1)
Basophils Relative: 0 %
Eosinophils Absolute: 0 10*3/uL (ref 0.0–0.5)
Eosinophils Relative: 0 %
HCT: 19.7 % — ABNORMAL LOW (ref 39.0–52.0)
Hemoglobin: 6.8 g/dL — CL (ref 13.0–17.0)
Immature Granulocytes: 1 %
Lymphocytes Relative: 8 %
Lymphs Abs: 0.9 10*3/uL (ref 0.7–4.0)
MCH: 33.8 pg (ref 26.0–34.0)
MCHC: 34.5 g/dL (ref 30.0–36.0)
MCV: 98 fL (ref 80.0–100.0)
Monocytes Absolute: 1.3 10*3/uL — ABNORMAL HIGH (ref 0.1–1.0)
Monocytes Relative: 11 %
Neutro Abs: 10 10*3/uL — ABNORMAL HIGH (ref 1.7–7.7)
Neutrophils Relative %: 80 %
Platelets: 175 10*3/uL (ref 150–400)
RBC: 2.01 MIL/uL — ABNORMAL LOW (ref 4.22–5.81)
RDW: 17.1 % — ABNORMAL HIGH (ref 11.5–15.5)
WBC: 12.3 10*3/uL — ABNORMAL HIGH (ref 4.0–10.5)
nRBC: 0 % (ref 0.0–0.2)

## 2019-06-12 LAB — COMPREHENSIVE METABOLIC PANEL
ALT: 18 U/L (ref 0–44)
AST: 13 U/L — ABNORMAL LOW (ref 15–41)
Albumin: 2.3 g/dL — ABNORMAL LOW (ref 3.5–5.0)
Alkaline Phosphatase: 50 U/L (ref 38–126)
Anion gap: 18 — ABNORMAL HIGH (ref 5–15)
BUN: 99 mg/dL — ABNORMAL HIGH (ref 8–23)
CO2: 13 mmol/L — ABNORMAL LOW (ref 22–32)
Calcium: 6.9 mg/dL — ABNORMAL LOW (ref 8.9–10.3)
Chloride: 114 mmol/L — ABNORMAL HIGH (ref 98–111)
Creatinine, Ser: 4.48 mg/dL — ABNORMAL HIGH (ref 0.61–1.24)
GFR calc Af Amer: 13 mL/min — ABNORMAL LOW (ref >60)
GFR calc non Af Amer: 11 mL/min — ABNORMAL LOW (ref >60)
Glucose, Bld: 110 mg/dL — ABNORMAL HIGH (ref 70–99)
Potassium: 3.4 mmol/L — ABNORMAL LOW (ref 3.5–5.1)
Sodium: 145 mmol/L (ref 135–145)
Total Bilirubin: 1.1 mg/dL (ref 0.3–1.2)
Total Protein: 4.5 g/dL — ABNORMAL LOW (ref 6.5–8.1)

## 2019-06-12 LAB — GLUCOSE, CAPILLARY
Glucose-Capillary: 120 mg/dL — ABNORMAL HIGH (ref 70–99)
Glucose-Capillary: 103 mg/dL — ABNORMAL HIGH (ref 70–99)

## 2019-06-12 LAB — MAGNESIUM: Magnesium: 1.7 mg/dL (ref 1.7–2.4)

## 2019-06-12 LAB — HEMOGLOBIN AND HEMATOCRIT, BLOOD
HCT: 19.6 % — ABNORMAL LOW (ref 39.0–52.0)
HCT: 23.3 % — ABNORMAL LOW (ref 39.0–52.0)
Hemoglobin: 6.8 g/dL — CL (ref 13.0–17.0)
Hemoglobin: 8.1 g/dL — ABNORMAL LOW (ref 13.0–17.0)

## 2019-06-12 LAB — PREPARE RBC (CROSSMATCH)

## 2019-06-12 MED ORDER — SCOPOLAMINE 1 MG/3DAYS TD PT72
1.0000 | MEDICATED_PATCH | TRANSDERMAL | Status: DC
  Administered 2019-06-12: 14:00:00 1.5 mg via TRANSDERMAL
  Filled 2019-06-12: qty 1

## 2019-06-12 MED ORDER — SODIUM CHLORIDE 0.9% IV SOLUTION
Freq: Once | INTRAVENOUS | Status: AC
  Administered 2019-06-12: 04:00:00 via INTRAVENOUS

## 2019-06-12 MED ORDER — BIOTENE DRY MOUTH MT LIQD: 15.0000 mL | OROMUCOSAL | Status: DC | PRN

## 2019-06-12 MED ORDER — POLYVINYL ALCOHOL 1.4 % OP SOLN
1.0000 [drp] | Freq: Four times a day (QID) | OPHTHALMIC | Status: DC | PRN
  Filled 2019-06-12: qty 15

## 2019-06-12 MED ORDER — GLYCOPYRROLATE 0.2 MG/ML IJ SOLN: 0.3000 mg | INTRAMUSCULAR | Status: DC | PRN

## 2019-06-12 MED ORDER — HYDROMORPHONE HCL 1 MG/ML IJ SOLN: 0.5000 mg | INTRAMUSCULAR | Status: DC | PRN

## 2019-06-12 MED ORDER — ONDANSETRON HCL 4 MG/2ML IJ SOLN: 4.0000 mg | Freq: Four times a day (QID) | INTRAMUSCULAR | Status: DC | PRN

## 2019-06-12 MED ORDER — HALOPERIDOL LACTATE 5 MG/ML IJ SOLN: 0.5000 mg | INTRAMUSCULAR | Status: DC | PRN

## 2019-06-12 MED ORDER — FUROSEMIDE 10 MG/ML IJ SOLN
20.0000 mg | Freq: Once | INTRAMUSCULAR | Status: AC
  Administered 2019-06-12: 12:00:00 20 mg via INTRAVENOUS
  Filled 2019-06-12: qty 2

## 2019-06-12 NOTE — Consult Note (Signed)
Consultation Note Date: 06/12/2019   Patient Name: Craig Craig Whitehead  DOB: January 01, 1928  MRN: [id number]  Age / Sex: 85 y.o., male   PCP: Craig Huddle, MD Referring Physician: Aline August, MD   REASON FOR CONSULTATION:Establishing goals of care  Palliative Care consult requested for this 85 y.o. male with multiple medical problems including chronic atrial fibrillation not on anticoagulation due to history of falls, DM type II, NPH s/p VP shunt (2002), GERD, BPH,  CKD stage III, and bladder cancer s/p resection. [name] presented to ED from [facility] facility with complaints of dark brown emesis. He was recently hospitalized Craig [name] for dehydration. It was noted patient has loss approximately 70 pounds over the last year. During his ED work-up he was noted to be tachycardic and tachypneic.  WBC 18.2, hemoglobin 8.1, BUN 105, creatinine 4.01, calcium 7.6, lactic acid 2.5.  CT scan showed large stool burden, chronic calcified pancreatitis, and small hiatal hernia.  COVID-19 screening negative.  Chest x-Whitehead showed bilateral perihilar airspace disease concerning for aspiration pneumonia.  Patient is receiving Unasyn.  Since admission patient has been seen by Gastroenterology and underwent an EGD for coffee-ground emesis and anemia.  EGD findings showed distal esophagitis, low-grade narrowing with Schatzki's ring, and medium size hiatal hernia.  Patient currently on bicarb drip.  Baseline creatinine 1.8 with a presentation of creatinine of 4.01.  Palliative medicine team consulted for goals of care.  Clinical Assessment and Goals of Care: I have reviewed medical records including lab results, imaging, Epic notes, and MAR, received report from the bedside RN, and assessed the patient. I met at the bedside with patient and also spoke with his daughter Craig Craig Whitehead via telephone to discuss diagnosis prognosis, [place], EOL wishes, disposition and options.  Craig Craig Whitehead is awake, alert and oriented x3 to  all asked questions.  He was able to engage in goals of care discussion providing most of his history and background.  I introduced Palliative Medicine as specialized medical care for people living with serious illness. It focuses on providing relief from the symptoms and stress of a serious illness. The goal is to improve quality of life for both the patient and the family.   We discussed a brief life review of the patient, along with his functional and nutritional status.  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  He is a Writer of [employer]. He states he is "happily divorced" and had 3 children (1 whom is deceased and one is estranged).  He shares that he enjoyed golfing and spending time with social clubs and friends.  Prior to admission patient reports living at skilled facility due to declining health.  Prior to going into the facility he reports living alone in a town home.  He reports he has been bedbound for the past 6-7 months.  He requires total assistance with all ADLs.  He endorses significant weight loss of approximately 70 pounds over the past year which he relates to declining health, taste bud changes, and lack of appetite.  We discussed His current illness and what it means in the larger context of His on-going co-morbidities. With specific discussions regarding dehydration, sepsis, GI bleed, and acute renal failure along with his overall decline in function. Natural disease trajectory and expectations at EOL were discussed.  Craig Craig Whitehead shares that he is aware of his current illness and states "my health has been declining for  some time now and the fight is over!"  Report given and space and opportunity allowed for him to continue to express his thoughts and feelings regarding his health.  He shares that he is pleased with how his life has been in being in the medical field he knows what his wishes are for himself.  He  states he is not interested in aggressive medical interventions and at this point he is also not interested in the rehospitalizations.   He states his wishes are not to be kept comfortable, minimize his pain sharing that he currently has pain in his buttocks and back.  He is requesting all aggressive interventions be discontinued and he is allowed to be comfortable for end-of-life care.  He began sharing that these have been his wishes for some time and that once he passes away his body is to be donated to medical science.  Craig Craig Whitehead shares that his brother Craig Craig Whitehead and his daughter, Craig Craig Whitehead are aware of his wishes and he is asking to call them and confirm in order to proceed with transitioning him to comfort. Support given. He reports he has not eaten or drank anything in the past 5-6 days and is not interested however, he would like a few ice chips or sips of something cool. Support given.   I attempted to elicit values and goals of care important to the patient.  I was able to call and speak with his daughter, Craig Craig Whitehead as he indicated. I reviewed with her my goals of care discussion with patient and she was tearful. Craig Craig Whitehead confirmed all of the information Craig Craig Whitehead shared. She expressed she was preparing to come to the hospital to visit with him and her plans were to transition him to comfort as he has expressed his wishes to her previously and she would like to carry them out. Emotional support given. She shared her love to her father expressing her awareness that he is tired and ready to leave this earth. Craig Craig Whitehead expressed to his daughter, he wanted to be kept comfortable and hopefully would get into a comfortable place of sleep and wake up in [place]. Craig Craig Whitehead tearful expressing her appreciation and support of the care given to her father. She is thankful that he was able to verbalize his wishes although she knew what he wanted expressing it allows her to be more at peace knowing what she had planned to do  (transition to comfort and hospice) is what he expressed. Support given.   I educated patient/family on what comfort care measures would look like. We discussed patient would no longer receive aggressive medical interventions such as continuous vital signs, lab work, radiology testing, or medications not focused on comfort. All care will focus on how the patient is looking and feeling. This will include management of any symptoms that may cause discomfort, pain, shortness of breath, cough, nausea, agitation, anxiety, and/or secretions etc. Symptoms will be managed with medications and other non-pharmacological interventions such as spiritual support if requested, repositioning, music therapy, or therapeutic listening. Again both patient and daughter verbalized understanding and appreciation. Confirming wishes to transition care to full comfort for EOL.   Mr. Benham expressed he did not have an advanced directive and Craig Craig Whitehead also confirmed. He states Craig Craig Whitehead is his desired Scientist, research (medical) as well as his brother Craig Craig Whitehead if needed. He confirms DNR/DNI.   Hospice outpatient were explained and offered. Patient and family verbalized their understanding and awareness of hospice's goals and philosophy of care. Request  were made for consideration of hospice home with [facility] to [facility]. Daughter educated on referral, approval, and transfer process.   Questions and concerns were addressed.  The family was encouraged to call with questions or concerns.  PMT will continue to support holistically.   SOCIAL HISTORY:     reports that he quit smoking about 24 years ago. He has never used smokeless tobacco. He reports current alcohol use of about 1.0 standard drinks of alcohol per week. He reports that he does not use drugs.  CODE STATUS: DNR  ADVANCE DIRECTIVES: Craig Craig Whitehead (daughter)   SYMPTOM MANAGEMENT: See below   Palliative Prophylaxis:   Aspiration, Delirium Protocol, Eye Care, Frequent Pain  Assessment, Oral Care, Palliative Wound Care and Turn Reposition  PSYCHO-SOCIAL/SPIRITUAL:  Support System: Family   Desire for further Chaplaincy support:Yes  Additional Recommendations (Limitations, Scope, Preferences):  Full Comfort Care   PAST MEDICAL HISTORY: Past Medical History:  Diagnosis Date  . Anxiety   . Atrial fibrillation ([place])   . Bladder cancer ([place])   . BPH (benign prostatic hypertrophy)   . Diabetes mellitus ([place])   . Diarrhea   . Difficult intubation   . ED (erectile dysfunction)   . GERD (gastroesophageal reflux disease)   . Hypercholesteremia   . Hypertension   . NPH (normal pressure hydrocephalus) ([place])   . Seborrheic dermatitis   . Vitamin D deficiency     PAST SURGICAL HISTORY:  Past Surgical History:  Procedure Laterality Date  . BLADDER SURGERY     x 4 for bladder cancer  . BRAIN SURGERY    . CATARACT EXTRACTION Bilateral   . INGUINAL HERNIA REPAIR Bilateral   . TRANSTHORACIC ECHOCARDIOGRAM  05/2016   [facility]: EF 45-50%. Mildly reduced function (likely related to A. fib). Marketed LA dilation. Moderate RA dilation. Aortic sclerosis without stenosis. Mild-moderate TR. Mild-moderate pulmonary tension. Small pericardial effusion versus pericardial fat  . VENTRICULOPERITONEAL SHUNT  2002    ALLERGIES:  has No Known Allergies.   MEDICATIONS:  Current Facility-Administered Medications  Medication Dose Route Frequency Provider Last Rate Last Dose  . albuterol (PROVENTIL) (2.5 MG/3ML) 0.083% nebulizer solution 2.5 mg  2.5 mg Nebulization Q6H PRN Ronnette Juniper, MD      . antiseptic oral rinse (BIOTENE) solution 15 mL  15 mL Topical PRN Pickenpack-Cousar, Earnie Rockhold N, NP      . glycopyrrolate (ROBINUL) injection 0.3 mg  0.3 mg Intravenous Q4H PRN Pickenpack-Cousar, Kaidin Boehle N, NP      . haloperidol lactate (HALDOL) injection 0.5 mg  0.5 mg Intravenous Q4H PRN Pickenpack-Cousar, Advith Martine N, NP      . HYDROmorphone (DILAUDID) injection  0.5-1 mg  0.5-1 mg Intravenous Q2H PRN Pickenpack-Cousar, Kaysea Raya N, NP      . ondansetron (ZOFRAN) injection 4 mg  4 mg Intravenous Q6H PRN Pickenpack-Cousar, Gray Maugeri N, NP      . polyvinyl alcohol (LIQUIFILM TEARS) 1.4 % ophthalmic solution 1 drop  1 drop Both Eyes QID PRN Pickenpack-Cousar, Deontay Ladnier N, NP      . scopolamine (TRANSDERM-SCOP) 1 MG/3DAYS 1.5 mg  1 patch Transdermal Q72H Pickenpack-Cousar, Brittnie Lewey N, NP        VITAL SIGNS: BP 112/68 (BP Location: Right Arm)   Pulse (!) 59   Temp 97.6 F (36.4 C) (Oral)   Resp 18   Ht 5' 7"  (1.702 m)   Wt 58.3 kg   SpO2 100%   BMI 20.13 kg/m  Filed Weights   06/10/19 0907 06/11/19 1035  06/12/19 0500  Weight: 53.1 kg 53 kg 58.3 kg    Estimated body mass index is 20.13 kg/m as calculated from the following:   Height as of this encounter: 5' 7"  (1.702 m).   Weight as of this encounter: 58.3 kg.  LABS: CBC:    Component Value Date/Time   WBC 12.3 (H) 06/12/2019 0248   HGB 8.1 (L) 06/12/2019 0838   HGB 13.1 04/07/2013 1055   HCT 23.3 (L) 06/12/2019 0838   PLT 175 06/12/2019 0248   Comprehensive Metabolic Panel:    Component Value Date/Time   NA 145 06/12/2019 0248   NA 142 04/07/2013 1055   K 3.4 (L) 06/12/2019 0248   K 3.9 04/07/2013 1055   CO2 13 (L) 06/12/2019 0248   CO2 23 04/07/2013 1055   BUN 99 (H) 06/12/2019 0248   BUN 17 04/07/2013 1055   CREATININE 4.48 (H) 06/12/2019 0248   CREATININE 0.96 04/07/2013 1055   ALBUMIN 2.3 (L) 06/12/2019 0248     Review of Systems  Constitutional: Positive for appetite change.  Respiratory: Positive for shortness of breath.   Musculoskeletal: Positive for arthralgias.  Neurological: Positive for weakness.  Unless otherwise noted, a complete review of systems is negative.  Physical Exam General: short of breath, frail chronically-ill appearing, cachectic Cardiovascular: regular rate and rhythm Pulmonary: diminished bilaterally, audible secretions, scattered crackles Abdomen:  soft, nontender, + bowel sounds Extremities: trace bilateral lower edema, no joint deformities Skin: scattered bruising, skin thin Neurological: awake, A&O x3, answered all questions appropriately   Prognosis: < 2 weeks in the setting of GI bleed, acute on chronic blood loss anemia, hemoglobin 6.8, sepsis, aspiration pneumonia, hypoxia, on chronic renal failure, atrial fibrillation, diabetes, history of bladder cancer, hydrocephalus s/p VP shunt, severe protein calorie malnutrition, cachectic, deconditioning, greater than 10% weight loss (70 pounds), bedbound.  Discharge Planning:  Hospice facility family requesting [facility]  Recommendations:  DNR/DNI-as confirmed by patient/daughter  Patient/daughter requesting to transition to full comfort/end-of-life care.  Request for residential hospice with preference for [facility] (referral placed for CSW)  We will discontinue all orders not comfort focused  Unrestricted visitations in the setting of comfort/EOL care  Robinul as needed for secretions  Haldol as needed for agitation/anxiety  Scopolamine patch  Dilaudid as needed for pain/dyspnea  Zofran as needed for nausea  Liquifilm Tears as needed for dry eyes  Patient may have comfort feeds with awareness of high risk of aspiration.  PMT will continue to support and follow as needed.   Palliative Performance Scale: PPS 10%              Patient and daughter, Craig Craig Whitehead expressed understanding and was in agreement with this plan.   Thank you for allowing the Palliative Medicine Team to assist in the care of this patient.  Time In: 1230 Time Out: 1335 Time Total: 65 min.   Visit consisted of counseling and education dealing with the complex and emotionally intense issues of symptom management and palliative care in the setting of serious and potentially life-threatening illness.Greater than 50%  of this time was spent counseling and coordinating care related to the above  assessment and plan.  Signed by:  Alda Lea, AGPCNP-BC Palliative Medicine Team  Phone: [phone] Fax: [phone] Pager: [phone] Amion: Craig Craig Whitehead

## 2019-06-12 NOTE — Progress Notes (Addendum)
Patient ID: Craig Whitehead, male   DOB: 1927-08-13, 85 y.o.   MRN: [id number]  PROGRESS NOTE    Craig Whitehead  [id number] DOB: [birth year] DOA: 06/10/2019 PCP: Josetta Huddle, MD   Brief Narrative:  85 year old male with history of chronic atrial fibrillation not on anticoagulation, diabetes mellitus type 2, NPH status post VP shunt, GERD, BPH and bladder cancer status post resection presented with coffee-ground emesis.  Hemoglobin was 8.1, baseline is around 7-8.  CT of the abdomen showed large stool burden, chronic calcified pancreatitis and small hiatal hernia.  WBC was 18.2.  Chest x-ray showed patchy bilateral perihilar airspace disease concerning for aspiration pneumonia.  COVID-19 testing was negative.  GI was consulted.  He was started on IV fluids and antibiotics.  Assessment & Plan:   Probable upper GI bleeding Acute on chronic blood loss anemia -Baseline hemoglobin around 7-8.  Presented with coffee-ground emesis.  Status post 1 unit packed red cell transfusion on 06/11/2019.  Hemoglobin this morning is 6.8.  Transfuse 1 unit of packed red cells.  Monitor H&H.  Continue Protonix. -Status post EGD on 06/11/2019 which showed distal esophagitis along with multiple ulcers in the duodenal bulb, biopsies were taken.  GI recommends PPI at least once a day for 1 month and sucralfate 4 times daily.  Sepsis: Present on admission Aspiration pneumonia Leukocytosis Hypoxia -Chest x-ray showed patchy bilateral perihilar airspace disease concerning for aspiration pneumonia.  Continue Unasyn.  Leukocytosis is improving.  Blood pressure slightly better. -Currently requiring oxygen via nasal cannula at 1 L/min -Initial COVID-19 testing negative  Acute renal failure superimposed on chronic kidney disease stage III Acute metabolic acidosis -Baseline creatinine around 1.8-2 -Presented with creatinine of 4.01.  Creatinine 4.48 this morning -Currently on bicarb drip.  Bicarb is 13 this  morning. -We will get renal ultrasound.  If renal function worsens, might need nephrology evaluation  Prolonged QT interval -QTC was 506 on admission. -Much improved.    Permanent atrial fibrillation -Not on anticoagulation as an outpatient.  Of history of falls.  Currently intermittently tachycardic.  Use metoprolol as needed.  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  Diabetes mellitus type 2 with hyperglycemia -A1c was 6.5 -Continue CBGs with SSI.  History of bladder cancer -Apparently underwent 4 surgeries to have bladder cancer resected  Generalized deconditioning Weight loss Probable severe protein calorie malnutrition -Extremely Deconditioned.  Approximately last 70 pounds of weight over the last year. -Palliative care evaluation is pending.  -I think he is appropriate for comfort measures.  Spoke to daughter on phone and asked her to come see her father and probably decide on the same.  DVT prophylaxis: SCDs Code Status: DNR Family Communication: Spoke to Urbanna [place]/daughter on phone on 06/12/2019.  Her phone number is [phone] Disposition Plan: Depends on clinical outcome  Consultants: GI  Procedures: None  Antimicrobials: Unasyn   Subjective: Patient seen and examined at bedside.  Very poor historian.  No overnight fever, vomiting, black or bloody stools reported by nursing staff. Objective: Vitals:   06/12/19 0500 06/12/19 0645 06/12/19 0650 06/12/19 0655  BP:  (!) 116/55    Pulse:  83 91 96  Resp:  17 15 (!) 21  Temp:  97.7 F (36.5 C)    TempSrc:  Oral    SpO2:  99% 100% 100%  Weight: 58.3 kg     Height:        Intake/Output  Summary (Last 24 hours) at 06/12/2019 0750 Last data filed at 06/12/2019 0700 Gross per 24 hour  Intake 1154 ml  Output 1800 ml  Net -646 ml   Filed Weights   06/10/19 0907 06/11/19 1035 06/12/19 0500  Weight: 53.1 kg 53 kg  58.3 kg    Examination:  General exam: No acute distress.  Looks chronically ill.  Wakes up very slightly, hardly answers any questions.   Respiratory system: Bilateral decreased breath sounds at bases with scattered crackles.  No wheezing  cardiovascular system: S1 & S2 heard, rate controlled Gastrointestinal system: Abdomen is nondistended, soft and nontender. Normal bowel sounds heard. Extremities: No cyanosis; trace edema Central nervous system: Sleepy, wakes up slightly, does not answer any questions. No focal neurological deficits. Moving extremities Skin: No rashes, lesions or ulcers Psychiatry: Could not be assessed because of mental status    Data Reviewed: I have personally reviewed following labs and imaging studies  CBC: Recent Labs  Lab 06/10/19 0550  06/10/19 1500 06/10/19 1750 06/11/19 0224 06/12/19 0000 06/12/19 0248  WBC 18.2*  --   --   --  25.2*  --  12.3*  NEUTROABS  --   --   --   --   --   --  10.0*  HGB 8.1*   < > 7.9* 7.3* 6.4* 6.8* 6.8*  HCT 25.2*   < > 23.9* 21.7* 19.1* 19.6* 19.7*  MCV 110.0*  --   --   --  106.1*  --  98.0  PLT 243  --   --   --  203  --  175   < > = values in this interval not displayed.   Basic Metabolic Panel: Recent Labs  Lab 06/10/19 0550 06/10/19 1005 06/10/19 1500 06/11/19 0224 06/12/19 0248  NA 143  --  144 146* 145  K 4.5  --  4.3 3.9 3.4*  CL 117*  --  122* 120* 114*  CO2 <7*  --  7* 9* 13*  GLUCOSE 215*  --  151* 86 110*  BUN 105*  --  99* 100* 99*  CREATININE 4.01*  --  3.70* 4.06* 4.48*  CALCIUM 7.6*  --  7.0* 7.2* 6.9*  MG  --  1.7  --   --  1.7   GFR: Estimated Creatinine Clearance: 9.2 mL/min (A) (by C-G formula based on SCr of 4.48 mg/dL (H)). Liver Function Tests: Recent Labs  Lab 06/10/19 0550 06/12/19 0248  AST 22 13*  ALT 34 18  ALKPHOS 77 50  BILITOT 1.0 1.1  PROT 5.3* 4.5*  ALBUMIN 2.9* 2.3*   Recent Labs  Lab 06/10/19 0550  LIPASE 14   No results for input(s): AMMONIA in the  last 168 hours. Coagulation Profile: Recent Labs  Lab 06/10/19 1500  INR 1.7*   Cardiac Enzymes: No results for input(s): CKTOTAL, CKMB, CKMBINDEX, TROPONINI in the last 168 hours. BNP (last 3 results) No results for input(s): PROBNP in the last 8760 hours. HbA1C: Recent Labs    06/10/19 1500  HGBA1C 6.5*   CBG: Recent Labs  Lab 06/10/19 1737 06/11/19 0937 06/11/19 1840 06/11/19 2149  GLUCAP 130* 76 64* 92   Lipid Profile: No results for input(s): CHOL, HDL, LDLCALC, TRIG, CHOLHDL, LDLDIRECT in the last 72 hours. Thyroid Function Tests: No results for input(s): TSH, T4TOTAL, FREET4, T3FREE, THYROIDAB in the last 72 hours. Anemia Panel: No results for input(s): VITAMINB12, FOLATE, FERRITIN, TIBC, IRON, RETICCTPCT in the last 72 hours. Sepsis Labs: Recent  Labs  Lab 06/10/19 0703 06/10/19 1404 06/10/19 1750  LATICACIDVEN 2.5* 1.9 1.6    Recent Results (from the past 240 hour(s))  Blood culture (routine x 2)     Status: None (Preliminary result)   Collection Time: 06/10/19  7:03 AM   Specimen: BLOOD  Result Value Ref Range Status   Specimen Description BLOOD SITE NOT SPECIFIED  Final   Special Requests   Final    BOTTLES DRAWN AEROBIC AND ANAEROBIC Blood Culture results may not be optimal due to an inadequate volume of blood received in culture bottles   Culture   Final    NO GROWTH 1 DAY Performed at [facility], [place] [address], [place], [place] [address]    Report Status PENDING  Incomplete  Blood culture (routine x 2)     Status: None (Preliminary result)   Collection Time: 06/10/19  7:03 AM   Specimen: BLOOD  Result Value Ref Range Status   Specimen Description BLOOD SITE NOT SPECIFIED  Final   Special Requests   Final    BOTTLES DRAWN AEROBIC AND ANAEROBIC Blood Culture results may not be optimal due to an inadequate volume of blood received in culture bottles   Culture   Final    NO GROWTH 1 DAY Performed at [facility], [place] [address], [place], [place] [address]    Report Status PENDING  Incomplete  SARS CORONAVIRUS 2 (TAT 6-24 HRS) Nasopharyngeal Nasopharyngeal Swab     Status: None   Collection Time: 06/10/19  9:04 AM   Specimen: Nasopharyngeal Swab  Result Value Ref Range Status   SARS Coronavirus 2 NEGATIVE NEGATIVE Final    Comment: (NOTE) SARS-CoV-2 target nucleic acids are NOT DETECTED. The SARS-CoV-2 RNA is generally detectable in upper and lower respiratory specimens during the acute phase of infection. Negative results do not preclude SARS-CoV-2 infection, do not rule out co-infections with other pathogens, and should not be used as the sole basis for treatment or other patient management decisions. Negative results must be combined with clinical observations, patient history, and epidemiological information. The expected result is Negative. Fact Sheet for Patients: SugarRoll.be Fact Sheet for Healthcare Providers: https://www.woods-mathews.com/ This test is not yet approved or cleared by the [place] FDA and  has been authorized for detection and/or diagnosis of SARS-CoV-2 by FDA under an Emergency Use Authorization (EUA). This EUA will remain  in effect (meaning this test can be used) for the duration of the COVID-19 declaration under Section 56 4(b)(1) of the Act, 21 U.S.C. section 360bbb-3(b)(1), unless the authorization is terminated or revoked sooner. Performed at [facility], [place] [address], [place], [place] [address]          Radiology Studies: Dg Chest Port 1 View  Result Date: 06/11/2019 CLINICAL DATA:  Shortness of breath, congestion, productive cough EXAM: PORTABLE CHEST 1 VIEW COMPARISON:  06/10/2019 FINDINGS: There is left lower lobe airspace disease. There is no pleural effusion or pneumothorax. The heart and mediastinal contours are stable. There is no acute osseous abnormality. IMPRESSION: Left lower lobe airspace disease  concerning for pneumonia. Electronically Signed   By: Kathreen Devoid   On: 06/11/2019 13:24        Scheduled Meds: . guaiFENesin  600 mg Oral BID  . insulin aspart  0-9 Units Subcutaneous TID WC  . pantoprazole (PROTONIX) IV  40 mg Intravenous Q24H  . sodium bicarbonate  650 mg Oral Daily  . sucralfate  1 g Oral  TID WC & HS  . thiamine  100 mg Oral Daily  . vitamin B-12  500 mcg Oral Daily   Continuous Infusions: . ampicillin-sulbactam (UNASYN) IV 3 g (06/11/19 1753)  . sodium bicarbonate 150 mEq in dextrose 5% 1000 mL 150 mEq (06/11/19 1918)          Aline August, MD [facility] 06/12/2019, 7:50 AM

## 2019-06-12 NOTE — Progress Notes (Signed)
  Speech Language Pathology Treatment: Dysphagia  Patient Details Name: Craig Whitehead MRN: [id number] DOB: February 18, 1928 Today's Date: 06/12/2019 Time: [id number] SLP Time Calculation (min) (ACUTE ONLY): 15 min  Assessment / Plan / Recommendation Clinical Impression  Pt was encountered asleep in bed: however, he roused to moderate verbal stimulation.  Pt presented with a baseline congested cough when his HOB was elevated in addition to wet vocal quality.  Suspect that pt may be aspirating his secretions.  Pt tolerated oral care prior to po trials without difficulty.  Following oral care, pt was observed with trials of ice chips, thin liquid, nectar-thick liquid, and puree.  He exhibited fair bolus acceptance with all trials via spoon and he had R anterior labial spillage with thin liquid trials.  Pt exhibited prolonged AP transport, suspected delayed swallow initiation, and an immediate, congested cough following all po trials.  Recommend continuation of NPO with consideration of alternative means of nutrition vs liberalized diet with known risk of aspiration per pt/family wishes.  SLP will continue to f/u per POC.     HPI HPI: 85yo male admitted 06/10/2019 with vomiting blood. PMH: chronic AFib, DM2, NPH s/p VP shunt, GERD, BPH, bladder cancer (s/p resection), HTN. CXR = Left lower lobe airspace disease concerning for pneumonia. EGD 06/11/2019 = Distal esophagitis, low-grade narrowing with Schatzki's ring, medium sized hiatal hernia.      SLP Plan  Continue with current plan of care       Recommendations  Diet recommendations: NPO Medication Administration: Via alternative means                Oral Care Recommendations: Oral care QID;Staff/trained caregiver to provide oral care Follow up Recommendations: 24 hour supervision/assistance SLP Visit Diagnosis: Dysphagia, unspecified (R13.10) Plan: Continue with current plan of care                      Colin Mulders M.S.,  [place] Office: [phone]  [place] 06/12/2019, 10:23 AM

## 2019-06-13 ENCOUNTER — Encounter (HOSPITAL_COMMUNITY): Payer: Self-pay | Admitting: Gastroenterology

## 2019-06-13 DIAGNOSIS — Z7189 Other specified counseling: Secondary | ICD-10-CM

## 2019-06-13 DIAGNOSIS — Z515 Encounter for palliative care: Secondary | ICD-10-CM

## 2019-06-13 LAB — TYPE AND SCREEN
ABO/RH(D): O POS
Antibody Screen: NEGATIVE
Unit division: 0
Unit division: 0

## 2019-06-13 LAB — BPAM RBC
Blood Product Expiration Date: 202012202359
Blood Product Expiration Date: 202012222359
ISSUE DATE / TIME: 202011200603
ISSUE DATE / TIME: 202011210351
Unit Type and Rh: 5100
Unit Type and Rh: 5100

## 2019-06-13 NOTE — Progress Notes (Signed)
[facility] [employer]  Received request from [facility] for patient/family interest. Chart reviewed and Craig Whitehead has been approved for transfer to [facility] Monday morning. Spoke with daughter Craig Whitehead who is HCPOA. She confirms plan to transfer Monday and will be available to do paper work with me Monday morning. TOC manager [place] aware. Will follow up with [name] in am.   Thank you,  Erling Conte, [place]

## 2019-06-13 NOTE — Progress Notes (Signed)
Patient ID: Craig Whitehead, male   DOB: July 06, 1928, 85 y.o.   MRN: [id number]  PROGRESS NOTE    Craig Whitehead  [name] DOB: Nov 07, 1927 DOA: 06/10/2019 PCP: Craig Huddle, MD   Brief Narrative:  85 year old male with history of chronic atrial fibrillation not on anticoagulation, diabetes mellitus type 2, NPH status post VP shunt, GERD, BPH and bladder cancer status post resection presented with coffee-ground emesis.  Hemoglobin was 8.1, baseline is around 7-8.  CT of the abdomen showed large stool burden, chronic calcified pancreatitis and small hiatal hernia.  WBC was 18.2.  Chest x-ray showed patchy bilateral perihilar airspace disease concerning for aspiration pneumonia.  COVID-19 testing was negative.  GI was consulted.  He was started on IV fluids and antibiotics.  He underwent EGD on 06/11/2019 which showed distal esophagitis and multiple ulcers in the duodenal bulb.  Because of worsening renal function and overall poor prognosis, palliative care evaluated the patient and had discussion with the patient and family.  Patient and family decided to pursue comfort measures and residential hospice.  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  Patient  and family decided to pursue comfort measures and residential hospice. -Patient was placed on comfort measures on 06/12/2019.  Antibiotics and other nonessential medications were discontinued.  Social worker has been consulted.  Awaiting residential hospice placement.  DVT prophylaxis: Comfort measures Code Status: DNR Family Communication: Spoke to Craig Whitehead/daughter on phone on 06/12/2019.  Her phone number is [phone] Disposition Plan: Residential hospice once bed is available  Consultants: GI  Procedures: EGD on 06/11/2019  Antimicrobials:  Anti-infectives (From admission, onward)   Start     Dose/Rate Route Frequency Ordered Stop   06/11/19 0800  Ampicillin-Sulbactam (UNASYN) 3 g in sodium chloride 0.9 % 100 mL IVPB  Status:  Discontinued     3 g 200 mL/hr over 30 Minutes Intravenous Every 24 hours 06/10/19 1057 06/12/19 1259   06/10/19 0630  Ampicillin-Sulbactam (UNASYN) 3 g in sodium chloride 0.9 % 100 mL IVPB     3 g 200 mL/hr over 30 Minutes Intravenous  Once 06/10/19 [id number] 06/10/19 [id number]        Subjective: Patient seen and examined at bedside.  Very poor historian.  Hardly wakes up on calling his name.    objective: Vitals:   06/12/19 0823 06/12/19 0824 06/12/19 1212 06/12/19 2101  BP: 103/68 103/68 112/68 128/73  Pulse: (!) 105 90 (!) 59 79  Resp:  19 18 18   Temp: 98.1 F (36.7 C)  97.6 F (36.4 C) 98.4 F (36.9 C)  TempSrc: Oral  Oral Oral  SpO2: 98% 97% 100% 100%  Weight:      Height:        Intake/Output Summary (Last 24 hours) at 06/13/2019 0738 Last data filed at 06/13/2019 0600 Gross per 24 hour  Intake 1517.5 ml  Output  1760 ml  Net -242.5 ml   Filed Weights   06/10/19 0907 06/11/19 1035 06/12/19 0500  Weight: 53.1 kg 53 kg 58.3 kg    Examination:  General exam: No distress.  Looks chronically ill.  Hardly wakes up on calling his name.   Respiratory system: Bilateral decreased breath sounds at bases with some crackles   cardiovascular system: S1-S2 heard, intermittently tachycardic and bradycardic   Data Reviewed: I have personally reviewed following labs and imaging studies  CBC: Recent Labs  Lab 06/10/19 0550  06/10/19 1750 06/11/19 0224 06/12/19 0000 06/12/19 0248 06/12/19 0838  WBC 18.2*  --   --  25.2*  --  12.3*  --   NEUTROABS  --   --   --   --   --  10.0*  --   HGB 8.1*   < > 7.3* 6.4* 6.8* 6.8* 8.1*  HCT 25.2*   < > 21.7* 19.1* 19.6* 19.7* 23.3*  MCV 110.0*  --   --  106.1*  --  98.0  --   PLT 243  --   --  203  --  175  --    < > = values in this interval not displayed.   Basic Metabolic Panel: Recent Labs  Lab 06/10/19 0550 06/10/19 1005 06/10/19 1500 06/11/19 0224 06/12/19 0248  NA 143  --  144 146* 145  K 4.5  --  4.3 3.9 3.4*  CL 117*  --  122* 120* 114*  CO2 <7*  --  7* 9* 13*  GLUCOSE 215*  --  151* 86 110*  BUN 105*  --  99* 100* 99*  CREATININE 4.01*  --  3.70* 4.06* 4.48*  CALCIUM 7.6*  --  7.0* 7.2* 6.9*  MG  --  1.7  --   --  1.7   GFR: Estimated Creatinine Clearance: 9.2 mL/min (A) (by C-G formula based on SCr of 4.48 mg/dL (H)). Liver Function Tests: Recent Labs  Lab 06/10/19 0550 06/12/19 0248  AST 22 13*  ALT 34 18  ALKPHOS 77 50  BILITOT 1.0 1.1  PROT 5.3* 4.5*  ALBUMIN 2.9* 2.3*   Recent Labs  Lab 06/10/19 0550  LIPASE 14   No results for input(s): AMMONIA in the last 168 hours. Coagulation Profile: Recent Labs  Lab 06/10/19 1500  INR 1.7*   Cardiac Enzymes: No results for input(s): CKTOTAL, CKMB, CKMBINDEX, TROPONINI in the last 168 hours. BNP (last 3 results) No results for input(s): PROBNP in the last 8760 hours. HbA1C: Recent Labs    06/10/19 1500  HGBA1C 6.5*   CBG: Recent Labs  Lab 06/11/19 0937 06/11/19 1840 06/11/19 2149 06/12/19 0821 06/12/19 1210  GLUCAP 76 64* 92 120* 103*   Lipid Profile: No results for input(s): CHOL, HDL, LDLCALC, TRIG, CHOLHDL, LDLDIRECT in the last 72 hours. Thyroid Function Tests:  No results for input(s): TSH, T4TOTAL, FREET4, T3FREE, THYROIDAB in the last 72 hours. Anemia Panel: No results for input(s): VITAMINB12, FOLATE, FERRITIN, TIBC, IRON, RETICCTPCT in the last 72 hours. Sepsis Labs: Recent Labs  Lab 06/10/19 0703 06/10/19 1404 06/10/19 1750  LATICACIDVEN 2.5* 1.9 1.6    Recent Results (from the past 240 hour(s))  Blood culture (routine x 2)     Status: None (Preliminary result)   Collection Time: 06/10/19  7:03 AM   Specimen: BLOOD  Result Value Ref Range Status   Specimen Description BLOOD SITE NOT SPECIFIED  Final   Special Requests   Final  BOTTLES DRAWN AEROBIC AND ANAEROBIC Blood Culture results may not be optimal due to an inadequate volume of blood received in culture bottles   Culture   Final    NO GROWTH 2 DAYS Performed at [facility], [place] [address], [place], [place] [address]    Report Status PENDING  Incomplete  Blood culture (routine x 2)     Status: None (Preliminary result)   Collection Time: 06/10/19  7:03 AM   Specimen: BLOOD  Result Value Ref Range Status   Specimen Description BLOOD SITE NOT SPECIFIED  Final   Special Requests   Final    BOTTLES DRAWN AEROBIC AND ANAEROBIC Blood Culture results may not be optimal due to an inadequate volume of blood received in culture bottles   Culture   Final    NO GROWTH 2 DAYS Performed at [facility], [place] [address], [place], [place] [address]    Report Status PENDING  Incomplete  SARS CORONAVIRUS 2 (TAT 6-24 HRS) Nasopharyngeal Nasopharyngeal Swab     Status: None   Collection Time: 06/10/19  9:04 AM   Specimen: Nasopharyngeal Swab  Result Value Ref Range Status   SARS Coronavirus 2 NEGATIVE NEGATIVE Final    Comment: (NOTE) SARS-CoV-2 target nucleic acids are NOT DETECTED. The SARS-CoV-2 RNA is generally detectable in upper and lower respiratory specimens during the acute phase of infection. Negative results do not preclude SARS-CoV-2 infection, do not rule out  co-infections with other pathogens, and should not be used as the sole basis for treatment or other patient management decisions. Negative results must be combined with clinical observations, patient history, and epidemiological information. The expected result is Negative. Fact Sheet for Patients: SugarRoll.be Fact Sheet for Healthcare Providers: https://www.woods-mathews.com/ This test is not yet approved or cleared by the [place] FDA and  has been authorized for detection and/or diagnosis of SARS-CoV-2 by FDA under an Emergency Use Authorization (EUA). This EUA will remain  in effect (meaning this test can be used) for the duration of the COVID-19 declaration under Section 56 4(b)(1) of the Act, 21 U.S.C. section 360bbb-3(b)(1), unless the authorization is terminated or revoked sooner. Performed at [facility], [place] [address], [place], [place] [address]          Radiology Studies: US Renal  Result Date: 06/12/2019 CLINICAL DATA:  Acute renal failure. EXAM: RENAL / URINARY TRACT ULTRASOUND COMPLETE COMPARISON:  06/10/2019 CT FINDINGS: Right Kidney: Renal measurements: 9.9 x 4.4 x 3.8 cm = volume: 87 mL. Normal renal cortical thickness and echogenicity. No hydronephrosis. Left Kidney: Renal measurements: 10.8 x 5.1 x 4.3 cm = volume: 123 mL. Normal echogenicity and renal cortical thickness. A lower pole cyst of 6.4 cm. Bladder: Presumed debris within the dependent bladder. Other: None. IMPRESSION: 1.  No hydronephrosis.  No explanation for acute renal failure. 2. Debris within the urinary bladder could be seen with cystitis. Electronically Signed   By: Abigail Miyamoto M.D.   On: 06/12/2019 11:31   Dg Chest Port 1 View  Result Date: 06/11/2019 CLINICAL DATA:  Shortness of breath, congestion, productive cough EXAM: PORTABLE CHEST 1 VIEW COMPARISON:  06/10/2019 FINDINGS: There is left lower lobe airspace disease. There is no pleural  effusion or pneumothorax. The heart and mediastinal contours are stable. There is no acute osseous abnormality. IMPRESSION: Left lower lobe airspace disease concerning for pneumonia. Electronically Signed   By: Kathreen Devoid   On: 06/11/2019 13:24  Scheduled Meds: . scopolamine  1 patch Transdermal Q72H   Continuous Infusions:         Aline August, MD [facility] 06/13/2019, 7:38 AM

## 2019-06-13 NOTE — Progress Notes (Addendum)
Daily Progress Note   Patient Name: Craig Whitehead       Date: 06/13/2019 DOB: 1927-10-05  Age: 85 y.o. MRN#: [id number] Attending Physician: Aline August, MD Primary Care Physician: Josetta Huddle, MD Admit Date: 06/10/2019  Reason for Consultation/Follow-up: Establishing goals of care  Subjective: Patient in bed, sipping on diet coke. Denies pain or SOB. Per nursing he is drinking sips, not eating and had visit from estranged family member today that went well.  Per SW referral has been made to [facility].   ROS  Length of Stay: 3  Current Medications: Scheduled Meds:   scopolamine  1 patch Transdermal Q72H    Continuous Infusions:   PRN Meds: albuterol, antiseptic oral rinse, glycopyrrolate, haloperidol lactate, HYDROmorphone (DILAUDID) injection, ondansetron (ZOFRAN) IV, polyvinyl alcohol  Physical Exam Vitals signs and nursing note reviewed.  Constitutional:      Comments: Cachetic  Pulmonary:     Effort: Pulmonary effort is normal.  Neurological:     General: No focal deficit present.             Vital Signs: BP 128/73 (BP Location: Right Arm)    Pulse 79    Temp 98.4 F (36.9 C) (Oral)    Resp 18    Ht 5\' 7"  (1.702 m)    Wt 58.3 kg    SpO2 100%    BMI 20.13 kg/m  SpO2: SpO2: 100 % O2 Device: O2 Device: Nasal Cannula O2 Flow Rate: O2 Flow Rate (L/min): 1.5 L/min  Intake/output summary:   Intake/Output Summary (Last 24 hours) at 06/13/2019 1428 Last data filed at 06/13/2019 1222 Gross per 24 hour  Intake 230 ml  Output 1160 ml  Net -930 ml   LBM: Last BM Date: 06/12/19 Baseline Weight: Weight: 53.1 kg Most recent weight: Weight: 58.3 kg       Palliative Assessment/Data: PPS: 20%      Patient Active Problem List   Diagnosis Date Noted    Hematemesis 06/10/2019   Macrocytic anemia 06/10/2019   GI bleed A999333   Metabolic acidosis A999333   Prolonged Q-T interval on ECG 06/10/2019   Pressure injury of skin 04/01/2017   Protein-calorie malnutrition, severe 03/29/2017   Hyperglycemia due to type 2 diabetes mellitus ([place]) 03/29/2017   Acute renal failure superimposed on chronic kidney disease ([place])  03/29/2017   Leukocytosis 03/29/2017   NPH (normal pressure hydrocephalus) ([place]) 03/29/2017   BPH (benign prostatic hyperplasia) 03/29/2017   History of bladder cancer 03/29/2017   Homelessness 03/29/2017   Weakness 03/28/2017   Permanent atrial fibrillation ([place]): CHA2DS2Vasc 2 (age). Rate controlled without medications asymptomatic 06/02/2016   Medication management 05/31/2016   Major depressive disorder, recurrent severe without psychotic features ([place]) 05/13/2016    Palliative Care Assessment & Plan   Patient Profile: Palliative Care consult requested for this 85 y.o. male with multiple medical problems including chronicatrial fibrillationnot on anticoagulation due to history of falls, DM type II, NPH s/pVP shunt (2002), GERD, BPH, CKD stage III, andbladder cancers/presection. Mr. Bartolomucci presented to ED from [facility] facility with complaints of dark brown emesis. He was recently hospitalized August 2020 for dehydration. It was noted patient has loss approximately 70 pounds over the last year. During his ED work-up he was noted to be tachycardic and tachypneic.  WBC 18.2, hemoglobin 8.1, BUN 105, creatinine 4.01, calcium 7.6, lactic acid 2.5.  CT scan showed large stool burden, chronic calcified pancreatitis, and small hiatal hernia.  COVID-19 screening negative.  Chest x-ray showed bilateral perihilar airspace disease concerning for aspiration pneumonia.  Patient is receiving Unasyn.  Since admission patient has been seen by Gastroenterology and underwent an EGD for coffee-ground emesis and  anemia.  EGD findings showed distal esophagitis, low-grade narrowing with Schatzki's ring, and medium size hiatal hernia.  Patient currently on bicarb drip.  Baseline creatinine 1.8 with a presentation of creatinine of 4.01.  Palliative medicine team consulted for goals of care.  Assessment/Recommendations/Plan   Continue current comfort interventions  Awaiting eval for [facility]  Goals of Care and Additional Recommendations:  Limitations on Scope of Treatment: Full Comfort Care  Code Status:  DNR  Prognosis:   < 2 weeks  Discharge Planning:  Hospice facility  Care plan was discussed with patient's RN and SW.   Thank you for allowing the Palliative Medicine Team to assist in the care of this patient.   Time In: 1400 Time Out: 1425 Total Time 25 mins Prolonged Time Billed no      Greater than 50%  of this time was spent counseling and coordinating care related to the above assessment and plan.  Mariana Kaufman, AGNP-C Palliative Medicine   Please contact Palliative Medicine Team phone at [phone] for questions and concerns.

## 2019-06-14 LAB — SURGICAL PATHOLOGY

## 2019-06-14 MED ORDER — SCOPOLAMINE 1 MG/3DAYS TD PT72: 1.0000 | MEDICATED_PATCH | TRANSDERMAL | Status: AC

## 2019-06-14 NOTE — TOC Transition Note (Signed)
Transition of Care [facility]) - CM/SW Discharge Note   Patient Details  Name: Craig Whitehead MRN: [id number] Date of Birth: July 16, 1928  Transition of Care [facility]) CM/SW Contact:  Sharin Mons, RN Phone Number: 06/14/2019, 12:51 PM   Clinical Narrative:     Patient will DC to: [facility] Anticipated DC date: 06/14/2019 Family notified: Nevin Bloodgood (daughter) Transport by: Corey Harold   Per MD patient ready for DC to [facility] . RN,  patient's family, and facility notified of DC. Discharge Summary completed . RN to call report prior to discharge [phone]). DC packet on chart. Ambulance transport requested for patient.   RNCM will sign off for now as intervention is no longer needed. Please consult [place] again if new needs arise.   Final next level of care: Hospice Medical Facility([facility])     Patient Goals and CMS Choice        Discharge Placement                       Discharge Plan and Services                                     Social Determinants of Health (SDOH) Interventions     Readmission Risk Interventions No flowsheet data found.

## 2019-06-14 NOTE — Progress Notes (Signed)
[facility] available today. Paper work complete. TOC Nadia aware.  RN please call report to [phone]. Discharge summary routed.   Thank you. Erling Conte, [place]

## 2019-06-14 NOTE — Discharge Summary (Signed)
Physician Discharge Summary  Craig Whitehead [id number] DOB: 05/18/1928 DOA: 06/10/2019  PCP: Josetta Huddle, MD  Admit date: 06/10/2019 Discharge date: 06/14/2019  Admitted From: Home Disposition: Residential hospice  Recommendations for Outpatient Follow-up:  1. Follow up with residential hospice at earliest [place]: No Equipment/Devices: None  Discharge Condition: Poor CODE STATUS: DNR Diet recommendation: Comfort measures  Brief/Interim Summary: 85 year old male with history of chronic atrial fibrillation not on anticoagulation, diabetes mellitus type 2, NPH status post VP shunt, GERD, BPH and bladder cancer status post resection presented with coffee-ground emesis.  Hemoglobin was 8.1, baseline is around 7-8.  CT of the abdomen showed large stool burden, chronic calcified pancreatitis and small hiatal hernia.  WBC was 18.2.  Chest x-ray showed patchy bilateral perihilar airspace disease concerning for aspiration pneumonia.  COVID-19 testing was negative.  GI was consulted.  He was started on IV fluids and antibiotics.  He underwent EGD on 06/11/2019 which showed distal esophagitis and multiple ulcers in the duodenal bulb.  Because of worsening renal function and overall poor prognosis, palliative care evaluated the patient and had discussion with the patient and family.  Patient and family decided to pursue comfort measures and residential hospice.  Discharge to residential hospice once bed is available.   Discharge Diagnoses:  Probable upper GI bleeding Acute on chronic blood loss anemia Sepsis: Present on admission Aspiration pneumonia Leukocytosis Hypoxia Acute renal failure superimposed on chronic kidney disease stage III Acute metabolic acidosis Prolonged QT interval Permanent atrial fibrillation Normal pressure hydrocephalus with history of VP shunt Diabetes mellitus type 2 with hyperglycemia History of bladder cancer Generalized  deconditioning Weight loss Probable severe protein calorie malnutrition  stage II pressure ulcer on the coccyx along with stage II pressure ulcer of the sacrum: Present on admission  Plan -  Because of worsening renal function and overall poor prognosis, palliative care evaluated the patient and had discussion with the patient and family.  Patient and family decided to pursue comfort measures and residential hospice. -Patient was placed on comfort measures on 06/12/2019.  Antibiotics and other nonessential medications were discontinued.  Discharge to residential hospice once bed is available.   Discharge Instructions   Allergies as of 06/14/2019   No Known Allergies     Medication List    STOP taking these medications   aspirin EC 81 MG tablet   cholestyramine 4 g packet Commonly known as: Questran   docusate sodium 100 MG capsule Commonly known as: COLACE   feeding supplement (ENSURE ENLIVE) Liqd   feeding supplement (GLUCERNA SHAKE) Liqd   ferrous sulfate 325 (65 FE) MG tablet   folic acid 1 MG tablet Commonly known as: FOLVITE   guaiFENesin 600 MG 12 hr tablet Commonly known as: MUCINEX   insulin aspart 100 UNIT/ML injection Commonly known as: novoLOG   insulin glargine 100 UNIT/ML injection Commonly known as: LANTUS   sodium bicarbonate 650 MG tablet   tamsulosin 0.4 MG Caps capsule Commonly known as: FLOMAX   thiamine 100 MG tablet   triamcinolone 0.1 % cream : eucerin Crea   vitamin B-12 500 MCG tablet Commonly known as: CYANOCOBALAMIN     TAKE these medications   scopolamine 1 MG/3DAYS Commonly known as: TRANSDERM-SCOP Place 1 patch (1.5 mg total) onto the skin every 3 (three) days. Start taking on: June 15, 2019       No Known Allergies  Consultations:  Palliative care/GI   Procedures/Studies: Ct Abdomen Pelvis Wo Contrast  Result Date:  06/10/2019 CLINICAL DATA:  Nausea and vomiting. EXAM: CT ABDOMEN AND PELVIS WITHOUT  CONTRAST TECHNIQUE: Multidetector CT imaging of the abdomen and pelvis was performed following the standard protocol without IV contrast. COMPARISON:  None. FINDINGS: Lower chest: Focal bronchiectasis in both lower lobes. Heart size is normal. Hepatobiliary: No focal liver abnormality is seen. No gallstones, gallbladder wall thickening, or biliary dilatation. Pancreas: Diffuse pancreatic atrophy. Numerous calcifications throughout the pancreas consistent with chronic calcific pancreatitis. Spleen: Normal. Adrenals/Urinary Tract: The adrenal glands are normal. Multiple bilateral renal calculi. No hydronephrosis. 6.7 cm cyst on the lower pole of the left kidney. 3.2 cm cyst on the upper pole of the left kidney. Focal small stones in the dependent portion of the bladder. Stomach/Bowel: Large amount of stool in the distended rectum. Moderate stool throughout the remainder of the colon. Small hiatal hernia. Small bowel appears normal. Appendix is not visualized. Vascular/Lymphatic: Aortic atherosclerosis. No enlarged abdominal or pelvic lymph nodes. Reproductive: Prostate is unremarkable. Other: Peritoneal catheter in the right side of the abdomen. Musculoskeletal: No acute or significant osseous findings. IMPRESSION: 1. Large amount of stool in the distended rectum with moderate stool throughout the remainder of the colon. 2. Chronic calcific pancreatitis. 3. Multiple bilateral renal calculi. 4. Aortic Atherosclerosis (ICD10-I70.0). Electronically Signed   By: Lorriane Shire M.D.   On: 06/10/2019 08:16   US Renal  Result Date: 06/12/2019 CLINICAL DATA:  Acute renal failure. EXAM: RENAL / URINARY TRACT ULTRASOUND COMPLETE COMPARISON:  06/10/2019 CT FINDINGS: Right Kidney: Renal measurements: 9.9 x 4.4 x 3.8 cm = volume: 87 mL. Normal renal cortical thickness and echogenicity. No hydronephrosis. Left Kidney: Renal measurements: 10.8 x 5.1 x 4.3 cm = volume: 123 mL. Normal echogenicity and renal cortical thickness. A  lower pole cyst of 6.4 cm. Bladder: Presumed debris within the dependent bladder. Other: None. IMPRESSION: 1.  No hydronephrosis.  No explanation for acute renal failure. 2. Debris within the urinary bladder could be seen with cystitis. Electronically Signed   By: Abigail Miyamoto M.D.   On: 06/12/2019 11:31   Dg Chest Port 1 View  Result Date: 06/11/2019 CLINICAL DATA:  Shortness of breath, congestion, productive cough EXAM: PORTABLE CHEST 1 VIEW COMPARISON:  06/10/2019 FINDINGS: There is left lower lobe airspace disease. There is no pleural effusion or pneumothorax. The heart and mediastinal contours are stable. There is no acute osseous abnormality. IMPRESSION: Left lower lobe airspace disease concerning for pneumonia. Electronically Signed   By: Kathreen Devoid   On: 06/11/2019 13:24   Dg Chest Port 1 View  Result Date: 06/10/2019 CLINICAL DATA:  Vomiting. Possible aspiration. EXAM: PORTABLE CHEST 1 VIEW COMPARISON:  03/04/2019 FINDINGS: Stable upper normal heart size. Unchanged mediastinal contours with aortic atherosclerosis and tortuosity. Patchy bilateral perihilar airspace disease, new from prior with central bronchial thickening. No pulmonary edema, large pleural effusion or pneumothorax. Skin fold projects over the right lateral chest wall. No visualized pneumomediastinum. Shunt catheter tubing projects over the right hemithorax. Bones are under mineralized. IMPRESSION: Patchy bilateral perihilar airspace disease, new from prior exam, may be atelectasis or pneumonia, including aspiration. Electronically Signed   By: Keith Rake M.D.   On: 06/10/2019 06:12    EGD on 06/11/2019   Subjective: Patient seen and examined at bedside.  Very poor historian.  Sleepy, wakes up on only very slightly.  Discharge Exam: Vitals:   06/12/19 2101 06/14/19 0550  BP: 128/73 (!) 144/81  Pulse: 79 67  Resp: 18   Temp: 98.4 F (36.9 C)  98 F (36.7 C)  SpO2: 100% 98%    General: Elderly male lying in  bed.  Looks chronically ill.  No distress.  Sleepy, wakes up only very slightly.  Hardly answers any questions  cardiovascular: rate controlled, S1/S2 + Respiratory: bilateral decreased breath sounds at bases   The results of significant diagnostics from this hospitalization (including imaging, microbiology, ancillary and laboratory) are listed below for reference.     Microbiology: Recent Results (from the past 240 hour(s))  Blood culture (routine x 2)     Status: None (Preliminary result)   Collection Time: 06/10/19  7:03 AM   Specimen: BLOOD  Result Value Ref Range Status   Specimen Description BLOOD SITE NOT SPECIFIED  Final   Special Requests   Final    BOTTLES DRAWN AEROBIC AND ANAEROBIC Blood Culture results may not be optimal due to an inadequate volume of blood received in culture bottles   Culture   Final    NO GROWTH 4 DAYS Performed at [facility], [place] [address], [place], [place] [address]    Report Status PENDING  Incomplete  Blood culture (routine x 2)     Status: None (Preliminary result)   Collection Time: 06/10/19  7:03 AM   Specimen: BLOOD  Result Value Ref Range Status   Specimen Description BLOOD SITE NOT SPECIFIED  Final   Special Requests   Final    BOTTLES DRAWN AEROBIC AND ANAEROBIC Blood Culture results may not be optimal due to an inadequate volume of blood received in culture bottles   Culture   Final    NO GROWTH 4 DAYS Performed at [facility], [place] [address], [place], [place] [address]    Report Status PENDING  Incomplete  SARS CORONAVIRUS 2 (TAT 6-24 HRS) Nasopharyngeal Nasopharyngeal Swab     Status: None   Collection Time: 06/10/19  9:04 AM   Specimen: Nasopharyngeal Swab  Result Value Ref Range Status   SARS Coronavirus 2 NEGATIVE NEGATIVE Final    Comment: (NOTE) SARS-CoV-2 target nucleic acids are NOT DETECTED. The SARS-CoV-2 RNA is generally detectable in upper and lower respiratory specimens during the acute phase of  infection. Negative results do not preclude SARS-CoV-2 infection, do not rule out co-infections with other pathogens, and should not be used as the sole basis for treatment or other patient management decisions. Negative results must be combined with clinical observations, patient history, and epidemiological information. The expected result is Negative. Fact Sheet for Patients: SugarRoll.be Fact Sheet for Healthcare Providers: https://www.woods-mathews.com/ This test is not yet approved or cleared by the [place] FDA and  has been authorized for detection and/or diagnosis of SARS-CoV-2 by FDA under an Emergency Use Authorization (EUA). This EUA will remain  in effect (meaning this test can be used) for the duration of the COVID-19 declaration under Section 56 4(b)(1) of the Act, 21 U.S.C. section 360bbb-3(b)(1), unless the authorization is terminated or revoked sooner. Performed at [facility], [place] [address] [place], [place] [address]      Labs: BNP (last 3 results) Recent Labs    02/25/19 1605  BNP 0000000*   Basic Metabolic Panel: Recent Labs  Lab 06/10/19 0550 06/10/19 1005 06/10/19 1500 06/11/19 0224 06/12/19 0248  NA 143  --  144 146* 145  K 4.5  --  4.3 3.9 3.4*  CL 117*  --  122* 120* 114*  CO2 <7*  --  7* 9* 13*  GLUCOSE 215*  --  151* 86 110*  BUN 105*  --  99* 100* 99*  CREATININE 4.01*  --  3.70* 4.06* 4.48*  CALCIUM 7.6*  --  7.0* 7.2* 6.9*  MG  --  1.7  --   --  1.7   Liver Function Tests: Recent Labs  Lab 06/10/19 0550 06/12/19 0248  AST 22 13*  ALT 34 18  ALKPHOS 77 50  BILITOT 1.0 1.1  PROT 5.3* 4.5*  ALBUMIN 2.9* 2.3*   Recent Labs  Lab 06/10/19 0550  LIPASE 14   No results for input(s): AMMONIA in the last 168 hours. CBC: Recent Labs  Lab 06/10/19 0550  06/10/19 1750 06/11/19 0224 06/12/19 0000 06/12/19 0248 06/12/19 0838  WBC 18.2*  --   --  25.2*  --  12.3*  --    NEUTROABS  --   --   --   --   --  10.0*  --   HGB 8.1*   < > 7.3* 6.4* 6.8* 6.8* 8.1*  HCT 25.2*   < > 21.7* 19.1* 19.6* 19.7* 23.3*  MCV 110.0*  --   --  106.1*  --  98.0  --   PLT 243  --   --  203  --  175  --    < > = values in this interval not displayed.   Cardiac Enzymes: No results for input(s): CKTOTAL, CKMB, CKMBINDEX, TROPONINI in the last 168 hours. BNP: Invalid input(s): POCBNP CBG: Recent Labs  Lab 06/11/19 0937 06/11/19 1840 06/11/19 2149 06/12/19 0821 06/12/19 1210  GLUCAP 76 64* 92 120* 103*   D-Dimer No results for input(s): DDIMER in the last 72 hours. Hgb A1c No results for input(s): HGBA1C in the last 72 hours. Lipid Profile No results for input(s): CHOL, HDL, LDLCALC, TRIG, CHOLHDL, LDLDIRECT in the last 72 hours. Thyroid function studies No results for input(s): TSH, T4TOTAL, T3FREE, THYROIDAB in the last 72 hours.  Invalid input(s): FREET3 Anemia work up No results for input(s): VITAMINB12, FOLATE, FERRITIN, TIBC, IRON, RETICCTPCT in the last 72 hours. Urinalysis    Component Value Date/Time   COLORURINE YELLOW 06/10/2019 1449   APPEARANCEUR CLOUDY (A) 06/10/2019 1449   LABSPEC 1.011 06/10/2019 1449   PHURINE 8.0 06/10/2019 1449   GLUCOSEU NEGATIVE 06/10/2019 1449   HGBUR MODERATE (A) 06/10/2019 1449   BILIRUBINUR NEGATIVE 06/10/2019 1449   KETONESUR NEGATIVE 06/10/2019 1449   PROTEINUR 100 (A) 06/10/2019 1449   NITRITE POSITIVE (A) 06/10/2019 1449   LEUKOCYTESUR LARGE (A) 06/10/2019 1449   Sepsis Labs Invalid input(s): PROCALCITONIN,  WBC,  LACTICIDVEN Microbiology Recent Results (from the past 240 hour(s))  Blood culture (routine x 2)     Status: None (Preliminary result)   Collection Time: 06/10/19  7:03 AM   Specimen: BLOOD  Result Value Ref Range Status   Specimen Description BLOOD SITE NOT SPECIFIED  Final   Special Requests   Final    BOTTLES DRAWN AEROBIC AND ANAEROBIC Blood Culture results may not be optimal due to an  inadequate volume of blood received in culture bottles   Culture   Final    NO GROWTH 4 DAYS Performed at [facility], [place] [address], [place], [place] [address]    Report Status PENDING  Incomplete  Blood culture (routine x 2)     Status: None (Preliminary result)   Collection Time: 06/10/19  7:03 AM   Specimen: BLOOD  Result Value Ref Range Status   Specimen Description BLOOD SITE NOT SPECIFIED  Final   Special Requests   Final    BOTTLES DRAWN AEROBIC AND ANAEROBIC Blood Culture results may not be optimal due to an inadequate volume of blood received in culture bottles   Culture   Final    NO GROWTH 4 DAYS Performed at [facility], [place] [address], [place], [place] [address]    Report Status PENDING  Incomplete  SARS CORONAVIRUS 2 (TAT 6-24 HRS) Nasopharyngeal Nasopharyngeal Swab     Status: None   Collection Time: 06/10/19  9:04 AM   Specimen: Nasopharyngeal Swab  Result Value Ref Range Status   SARS Coronavirus 2 NEGATIVE NEGATIVE Final    Comment: (NOTE) SARS-CoV-2 target nucleic acids are NOT DETECTED. The SARS-CoV-2 RNA is generally detectable in upper and lower respiratory specimens during the acute phase of infection. Negative results do not preclude SARS-CoV-2 infection, do not rule out co-infections with other pathogens, and should not be used as the sole basis for treatment or other patient management decisions. Negative results must be combined with clinical observations, patient history, and epidemiological information. The expected result is Negative. Fact Sheet for Patients: SugarRoll.be Fact Sheet for Healthcare Providers: https://www.woods-mathews.com/ This test is not yet approved or cleared by the [place] FDA and  has been authorized for detection and/or diagnosis of SARS-CoV-2 by FDA under an Emergency Use Authorization (EUA). This EUA will remain  in effect (meaning this test can be used) for the  duration of the COVID-19 declaration under Section 56 4(b)(1) of the Act, 21 U.S.C. section 360bbb-3(b)(1), unless the authorization is terminated or revoked sooner. Performed at [facility], [place] [address], [place], [place] [address]      Time coordinating discharge: 35 minutes  SIGNED:   Aline August, MD  [facility] 06/14/2019, 9:09 AM

## 2019-06-14 NOTE — Progress Notes (Signed)
Latrelle Dodrill to be D/C'd to [facility] per MD order. Report called to [facility] at [facility]. VVS, Skin clean and dry, stage 2 pressure injury to buttocks. IV catheters left in place. An After Visit Summary was printed and given to PTAR. Patient escorted via stretcher and D/C to [facility]. Melonie [place]  06/14/2019 2:39 PM

## 2019-06-14 NOTE — Care Management Important Message (Signed)
Important Message  Patient Details  Name: Craig Whitehead MRN: [id number] Date of Birth: 12/03/1927   Medicare Important Message Given:  Yes     Kinlee Garrison [address] 06/14/2019, 3:13 PM

## 2019-06-14 NOTE — Consult Note (Signed)
   [facility] CM Inpatient Consult   06/14/2019  Craig Whitehead 1927-10-01 [id number]  .Patient screened for extreme high risk score for unplanned readmission score  and for hospitalizations to check if potential [facility] services in [place]. Review of patient's medical record reveals patient is going to [facility].  No [facility] Care Management needs assessed.  Will sign off.  For questions contact:   Natividad Brood, RN BSN [facility] Liaison  [phone] business mobile phone Toll free office [phone]  Fax number: [phone] [name]

## 2019-06-15 LAB — CULTURE, BLOOD (ROUTINE X 2)
Culture: NO GROWTH
Culture: NO GROWTH

## 2019-07-23 DEATH — deceased

## 2019-11-23 IMAGING — CT CT HEAD WITHOUT CONTRAST
3 series · 16 of 47 positions shown, 19 images · non-contrast
Comparison: Head CT dated 07/20/2018

CLINICAL DATA: 89-year-old male with altered mental status and
generalized weakness.

EXAM:
CT HEAD WITHOUT CONTRAST
TECHNIQUE: Contiguous axial images were obtained from the base of the skull
through the vertex without intravenous contrast.

[Series 2: head wo · axial · 0.43mm/px · z∈[+1597,+1732]mm · 10 of 33 slices shown, 13 images]
[im 3/33  brain]
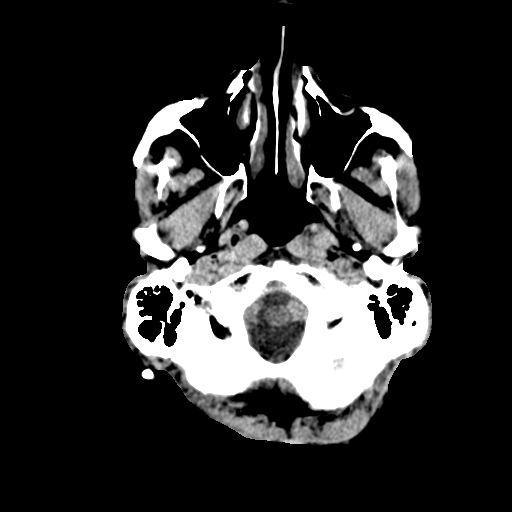
[im 3/33  bone]
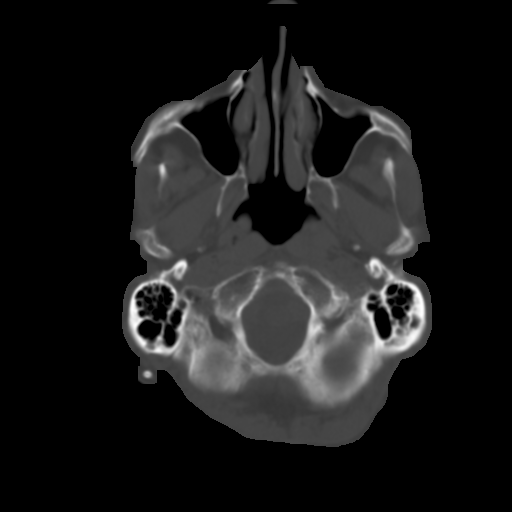
[im 6/33  brain]
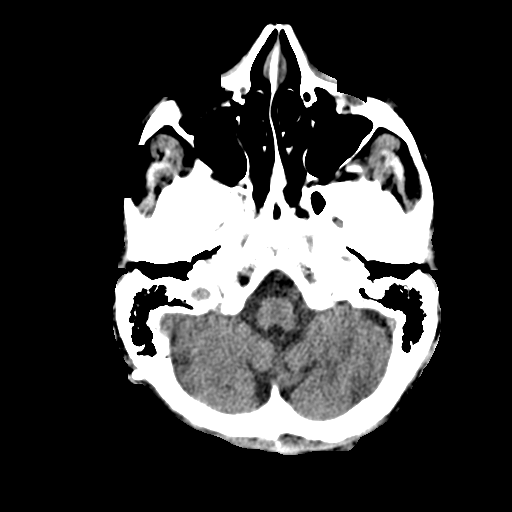
[im 9/33  brain]
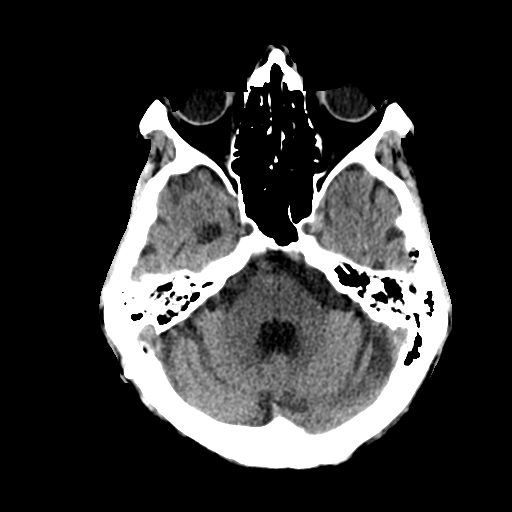
[im 12/33  brain]
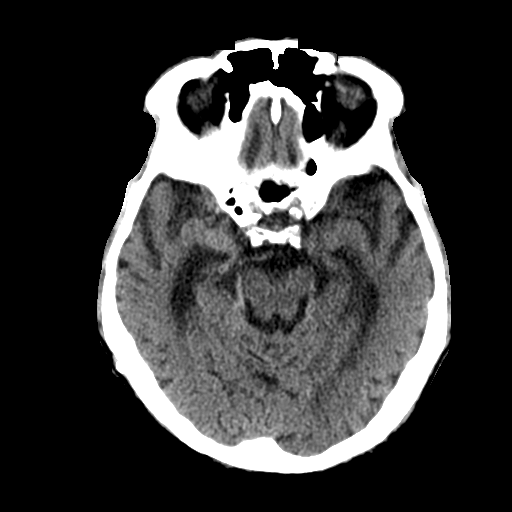
[im 15/33  brain]
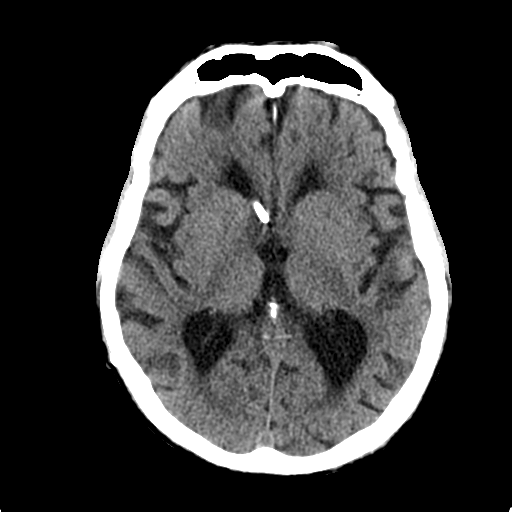
[im 15/33  bone]
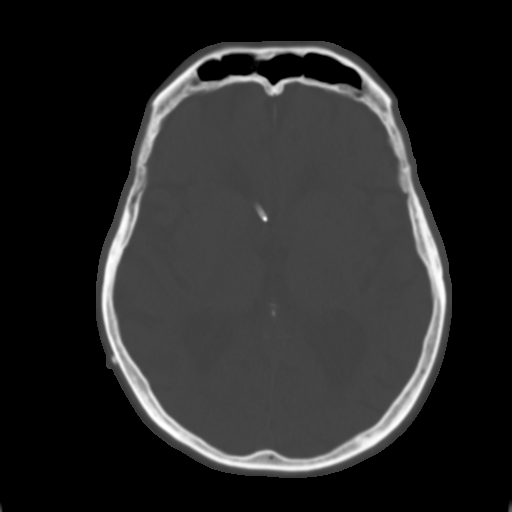
[im 18/33  brain]
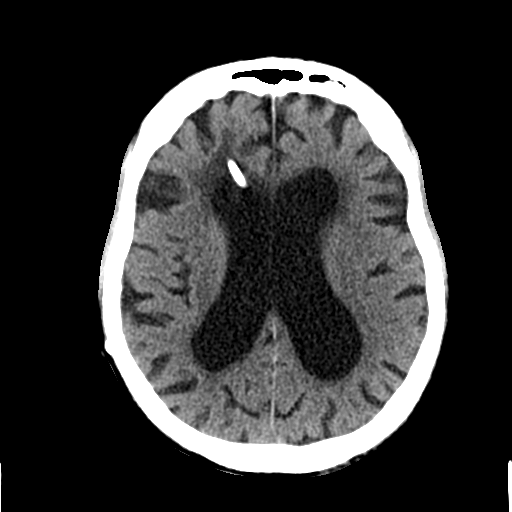
[im 21/33  brain]
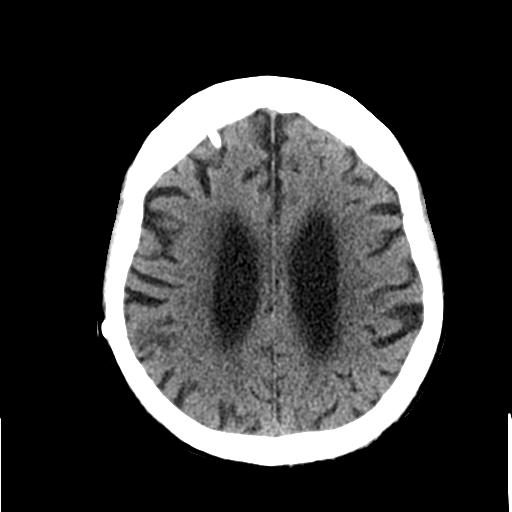
[im 25/33  brain]
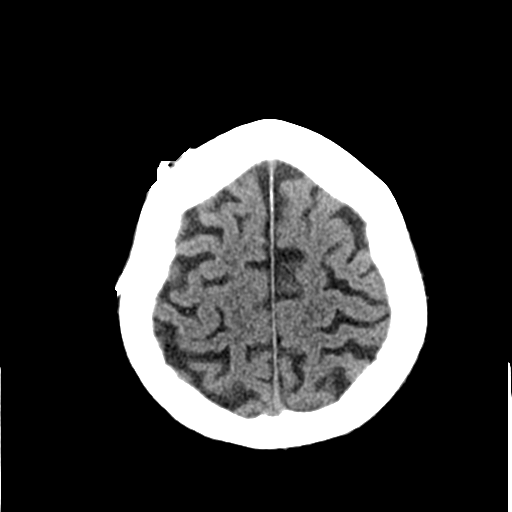
[im 27/33  brain]
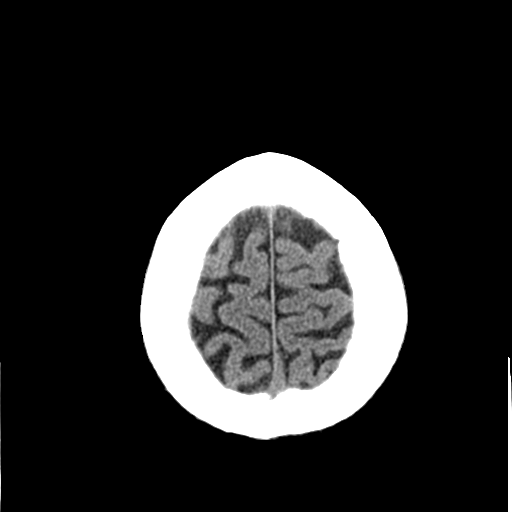
[im 27/33  bone]
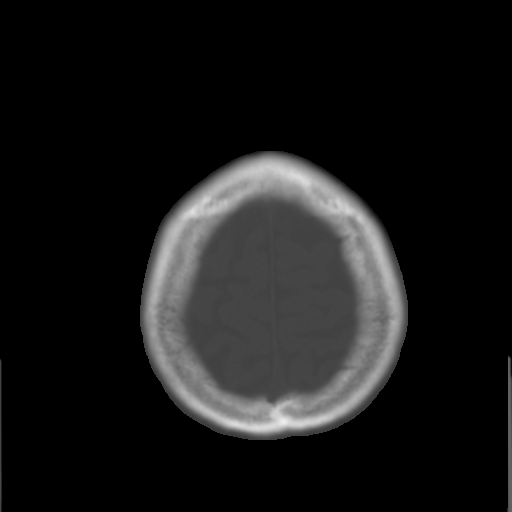
[im 30/33  brain]
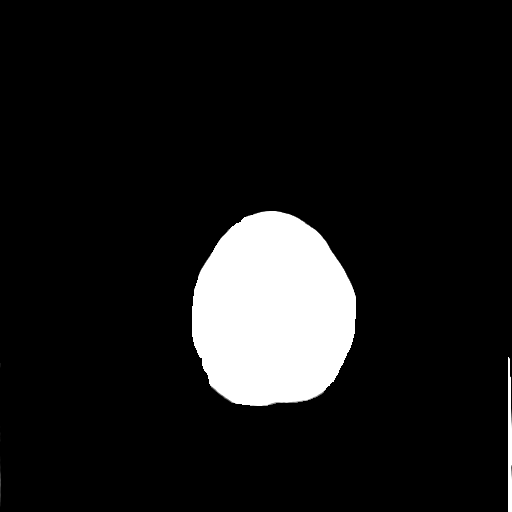

[Series 5: coronal soft tissue · coronal · 0.31mm/px · 3 of 66 slices shown]
[im 24/66  brain]
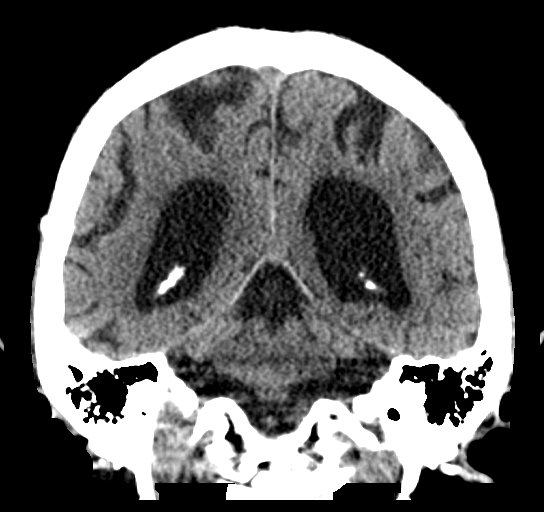
[im 30/66  brain]
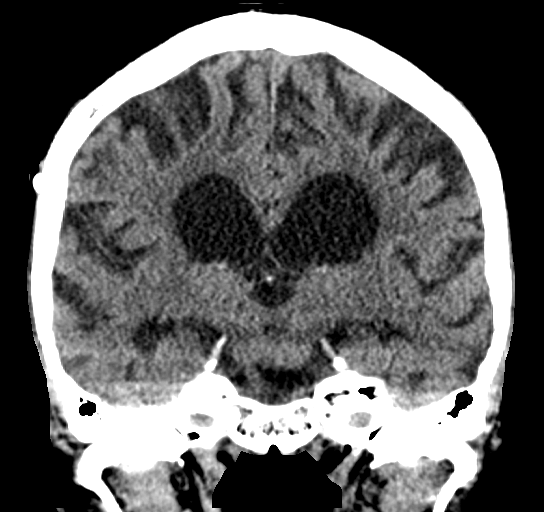
[im 36/66  brain]
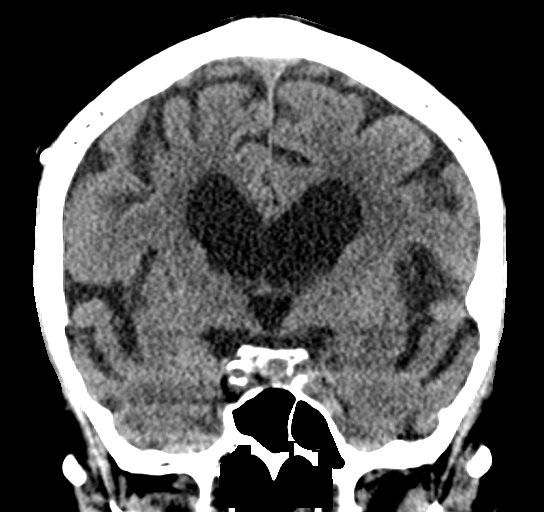

[Series 6: sagittal soft tissue · sagittal · 0.31mm/px · 3 of 58 slices shown]
[im 20/58  brain]
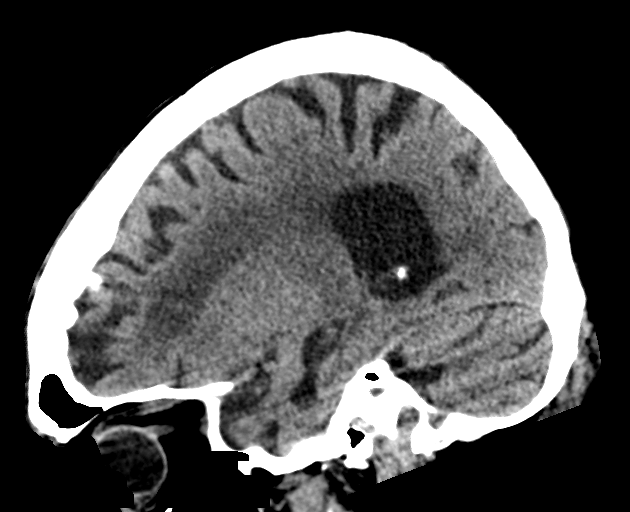
[im 29/58  brain]
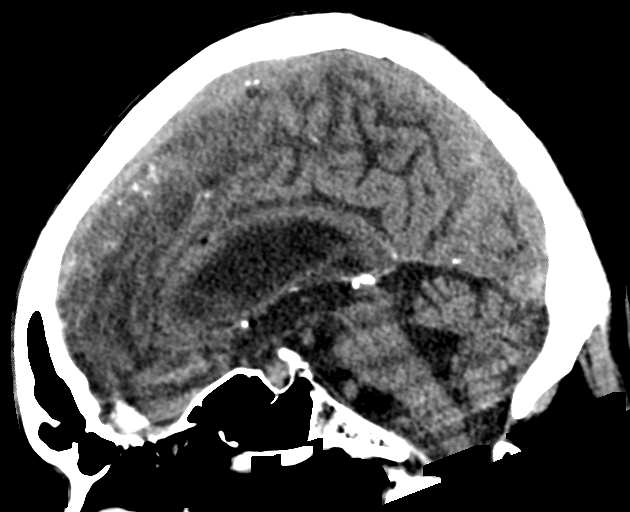
[im 39/58  brain]
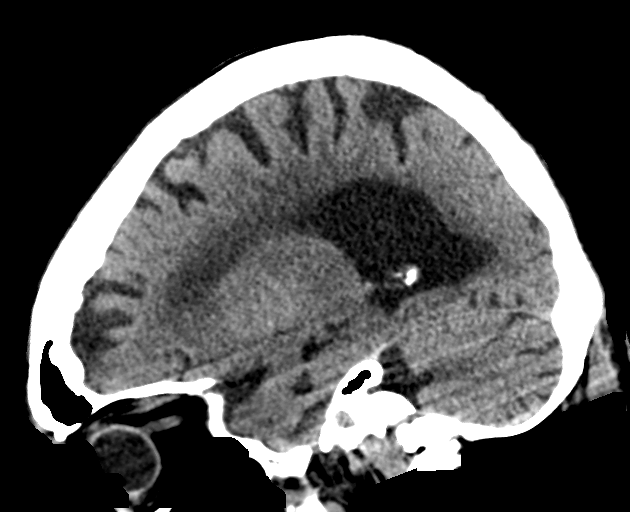

[16 of 47 positions shown; findings below may reference images not displayed]

FINDINGS: Brain: Right frontal approach ventriculostomy shunt with tip in the
region of the right foramen [place] similar to prior CT. Stable
appearance of the ventricular size. There is mild age-related
atrophy and chronic microvascular ischemic changes. There is no
acute intracranial hemorrhage. No mass effect or midline shift. No
extra-axial fluid collection.

Vascular: No hyperdense vessel or unexpected calcification.

Skull: Normal. Negative for fracture or focal lesion.

Sinuses/Orbits: No acute finding.

Other: None
IMPRESSION: 1. No acute intracranial hemorrhage.
2. Mild age-related atrophy and chronic microvascular ischemic
changes.
3. Right frontal approach ventriculostomy shunt with stable
appearance of the ventricular size.

## 2020-03-09 ENCOUNTER — Telehealth: Payer: Self-pay | Admitting: Cardiology

## 2020-03-09 NOTE — Telephone Encounter (Signed)
Attempted to contact patient to schedule follow up with [facility]head from recall list but the number on file for patient is invalid

## 2020-03-14 ENCOUNTER — Telehealth: Payer: Self-pay | Admitting: *Deleted

## 2020-03-14 NOTE — Telephone Encounter (Signed)
A message was left, re: his follow up visit. 

## 2020-04-19 IMAGING — DX DG CHEST 1V PORT
1 series · 1 of 1 positions shown · non-contrast
Comparison: 03/04/2019

CLINICAL DATA: Vomiting. Possible aspiration.

EXAM:
PORTABLE CHEST 1 VIEW

[chest ap]
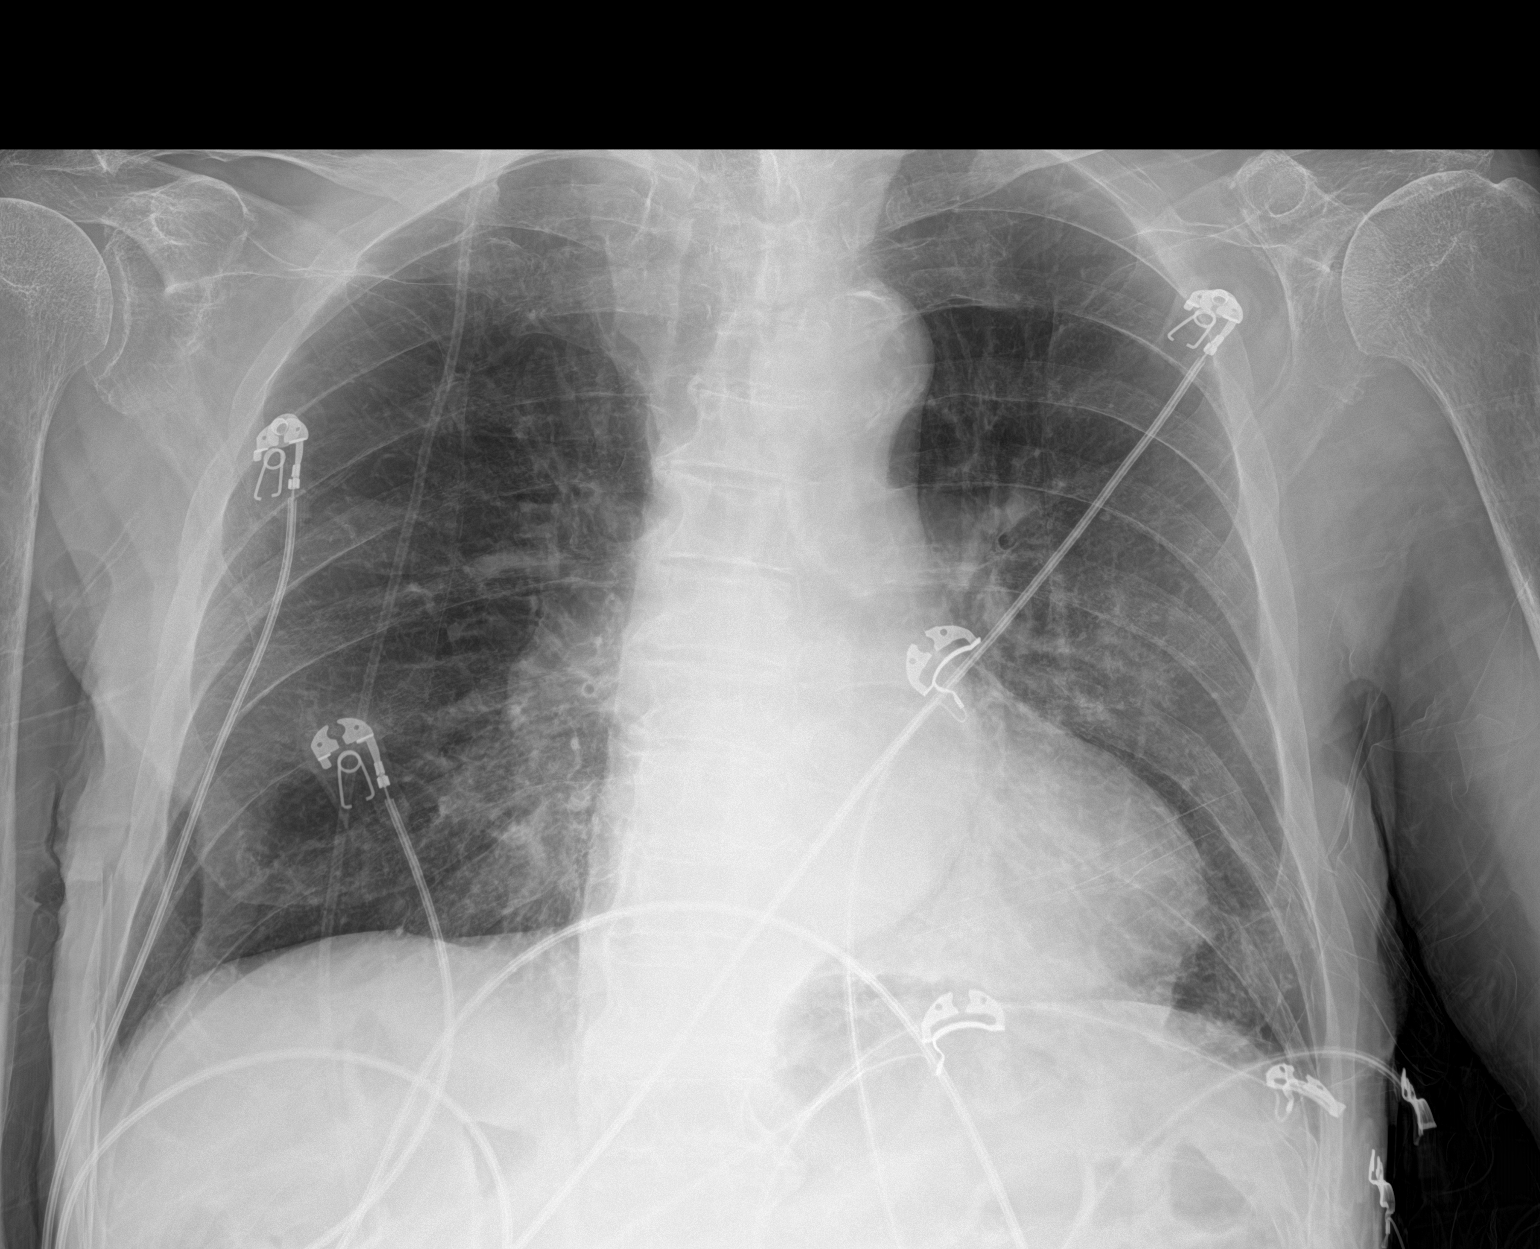

[1 of 1 positions shown; findings below may reference images not displayed]

FINDINGS: Stable upper normal heart size. Unchanged mediastinal contours with
aortic atherosclerosis and tortuosity. Patchy bilateral perihilar
airspace disease, new from prior with central bronchial thickening.
No pulmonary edema, large pleural effusion or pneumothorax. Skin
fold projects over the right lateral chest wall. No visualized
pneumomediastinum. Shunt catheter tubing projects over the right
hemithorax. Bones are under mineralized.
IMPRESSION: Patchy bilateral perihilar airspace disease, new from prior exam,
may be atelectasis or pneumonia, including aspiration.

## 2020-04-21 IMAGING — US US RENAL
1 series · 14 of 25 positions shown · non-contrast
Comparison: 06/10/2019 CT

CLINICAL DATA: Acute renal failure.

EXAM:
RENAL / URINARY TRACT ULTRASOUND COMPLETE

[Series 1: us renal · 58 acquisitions, 14 frames shown]
[im 1/58]
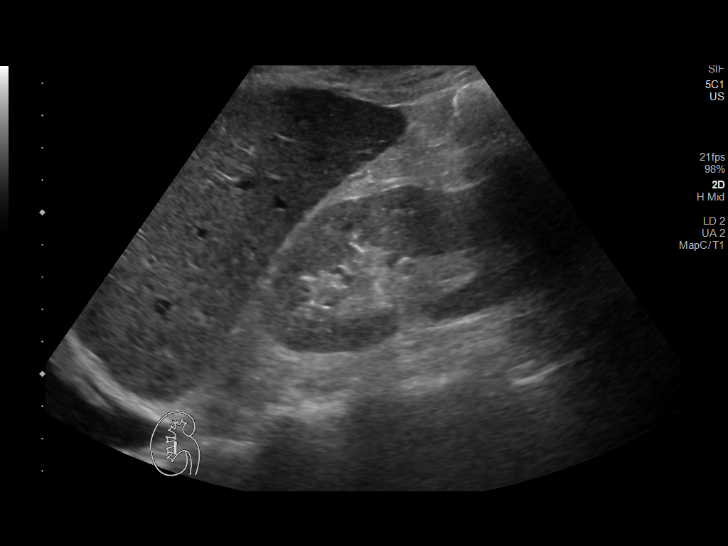
[im 5/58]
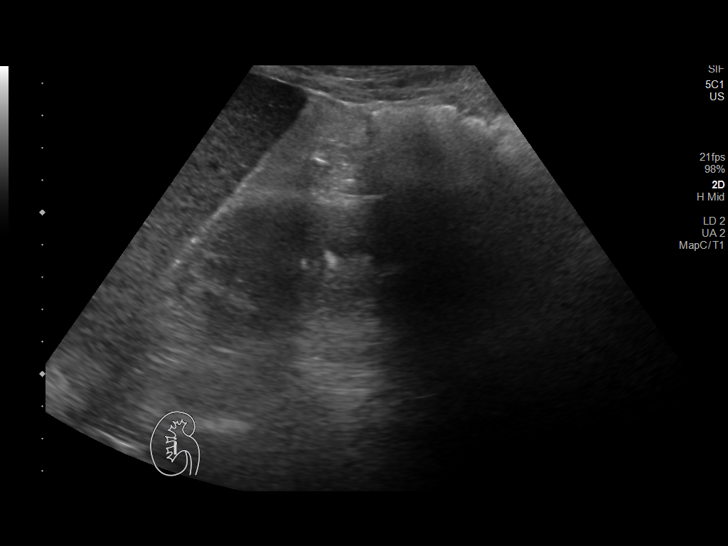
[im 10/58]
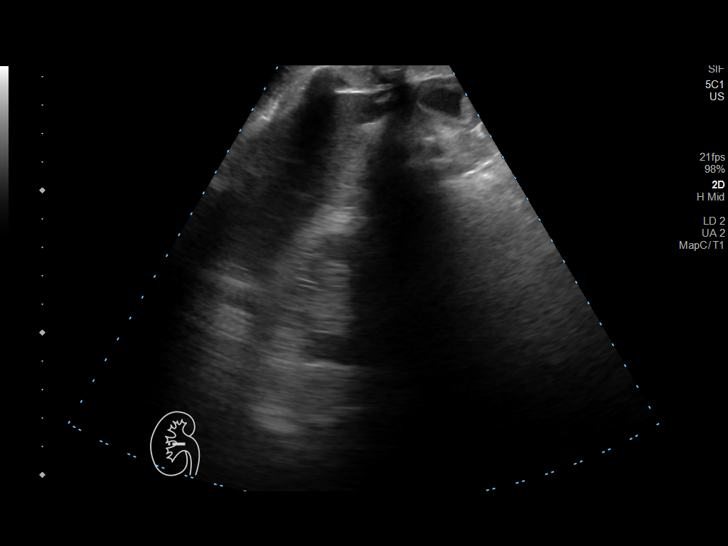
[im 15/58]
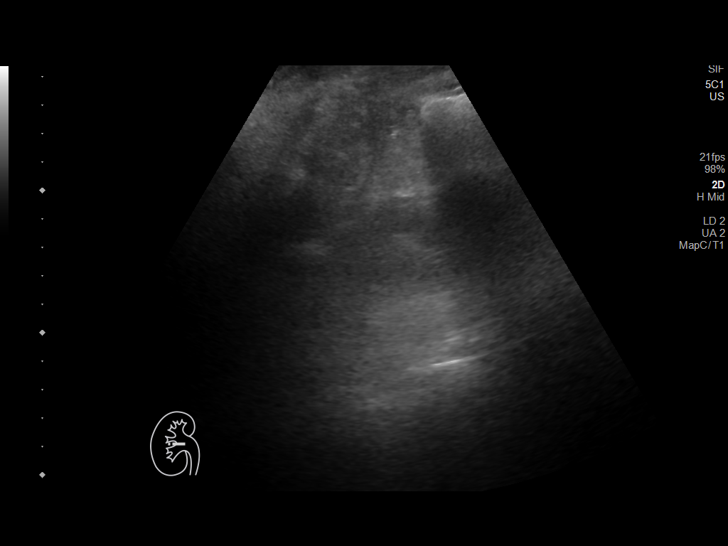
[im 20/58]
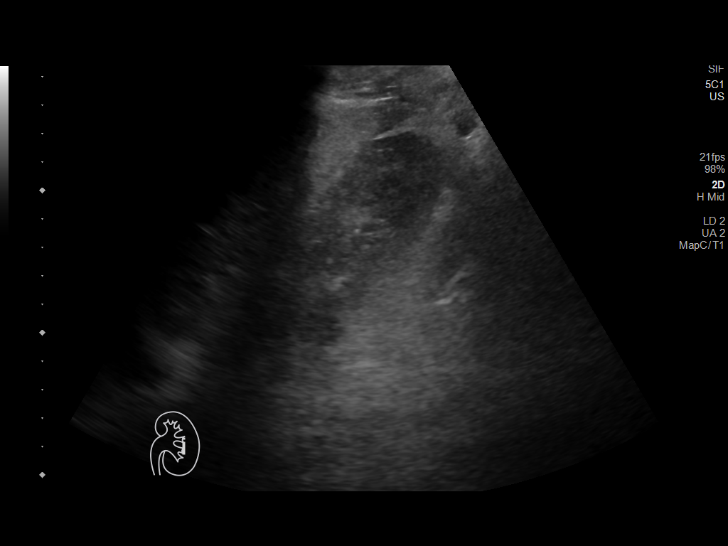
[im 22/58]
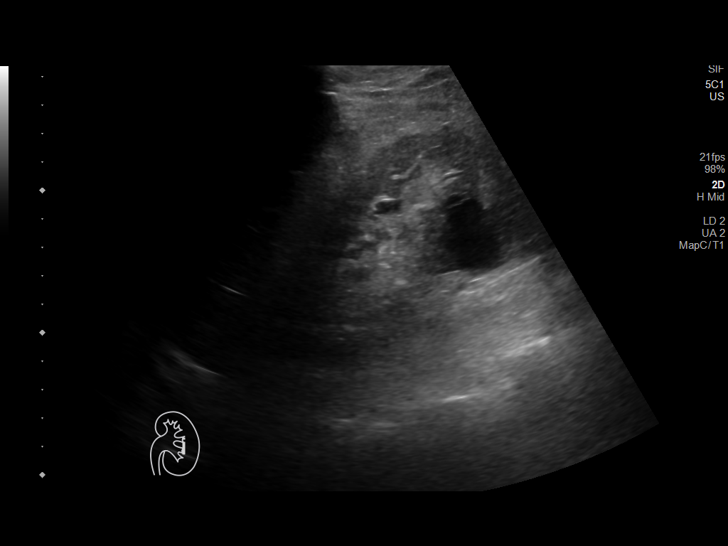
[im 27/58]
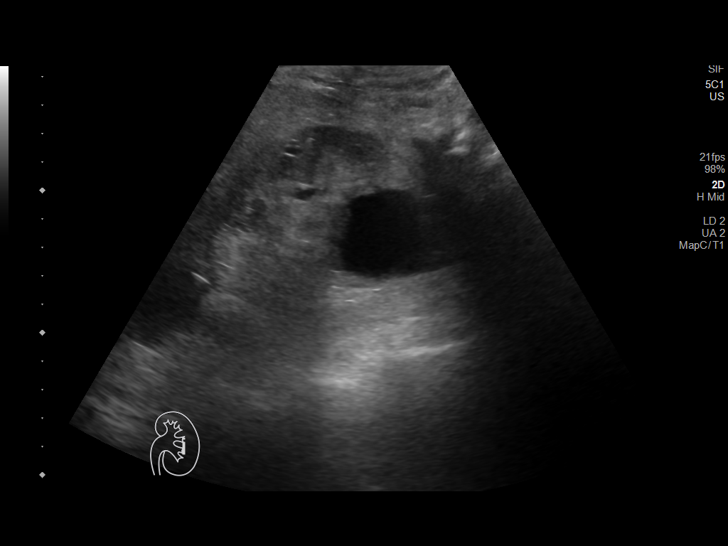
[im 31/58]
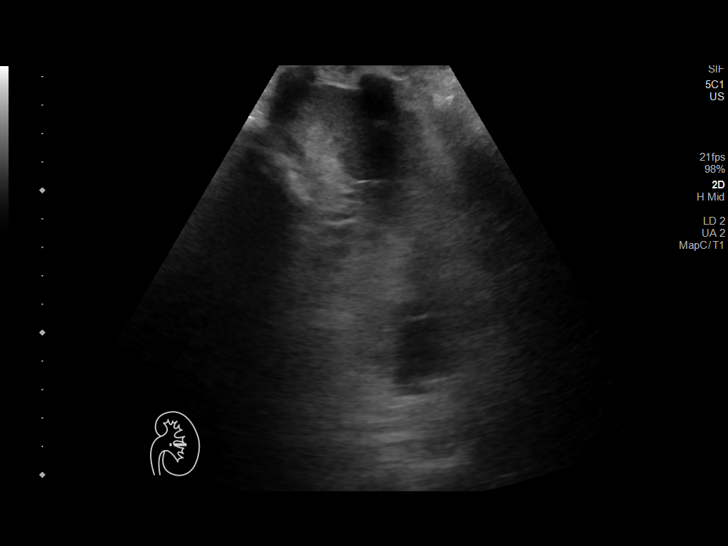
[im 36/58]
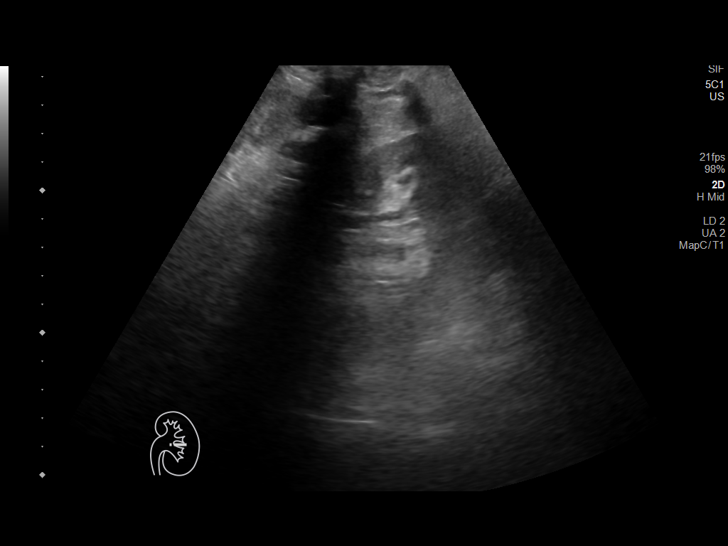
[im 39/58]
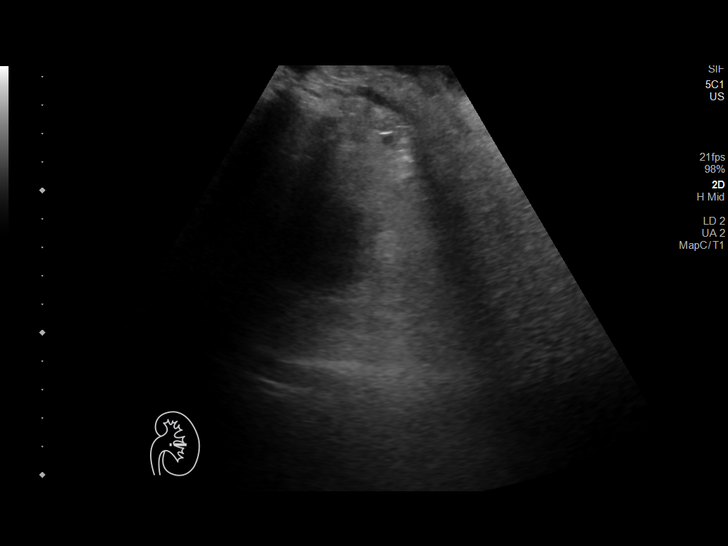
[im 43/58]
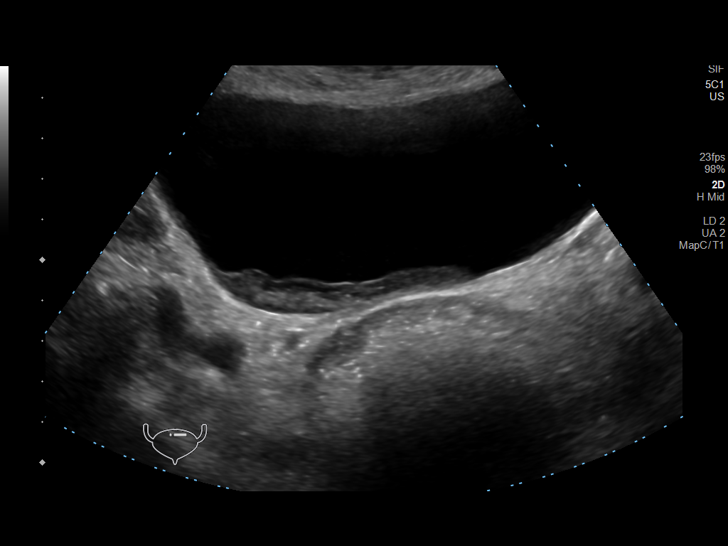
[im 48/58]
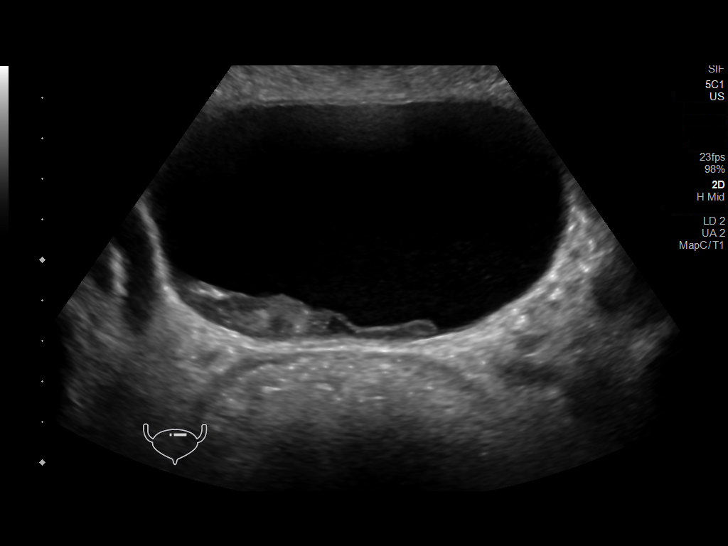
[im 53/58]
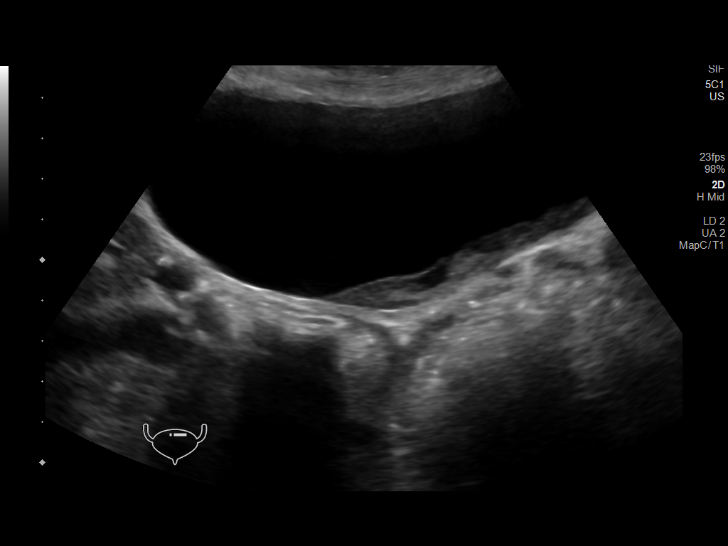
[im 58/58]
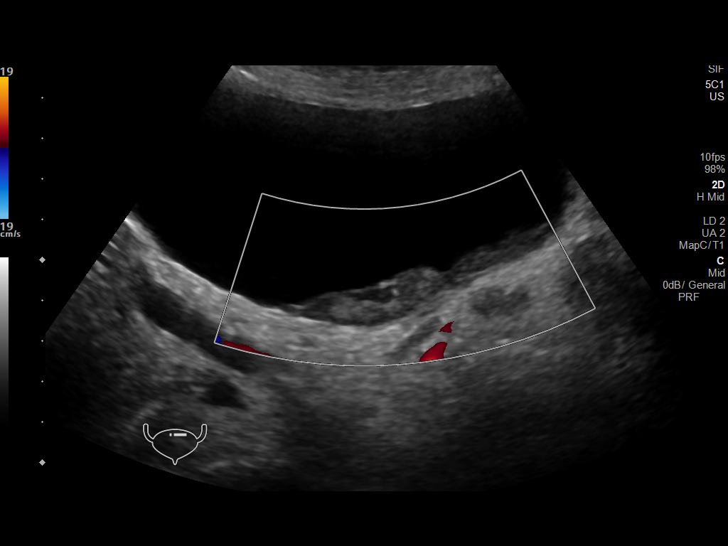

[14 of 25 positions shown; findings below may reference images not displayed]

FINDINGS: Right Kidney:

Renal measurements: 9.9 x 4.4 x 3.8 cm = volume: 87 mL. Normal renal
cortical thickness and echogenicity. No hydronephrosis.

Left Kidney:

Renal measurements: 10.8 x 5.1 x 4.3 cm = volume: 123 mL. Normal
echogenicity and renal cortical thickness. A lower pole cyst of
cm.

Bladder:

Presumed debris within the dependent bladder.

Other:

None.
IMPRESSION: 1.  No hydronephrosis.  No explanation for acute renal failure.
2. Debris within the urinary bladder could be seen with cystitis.

## 2020-05-24 ENCOUNTER — Telehealth: Payer: Self-pay | Admitting: Cardiology

## 2020-05-24 ENCOUNTER — Encounter: Payer: Self-pay | Admitting: General Practice

## 2020-05-24 NOTE — Telephone Encounter (Signed)
°  We have attempted to contact patient 3 times to schedule 12 mo recall that was due in August with no response, recall letter sent
